# Patient Record
Sex: Male | Born: 1990 | Race: White | Hispanic: No | Marital: Married | Smoking: Former smoker
Health system: Southern US, Community
[De-identification: ages and names within clinical notes are randomized; demographics above are authoritative.]

## PROBLEM LIST (undated history)

## (undated) DIAGNOSIS — G709 Myoneural disorder, unspecified: Secondary | ICD-10-CM

## (undated) DIAGNOSIS — M1990 Unspecified osteoarthritis, unspecified site: Secondary | ICD-10-CM

## (undated) DIAGNOSIS — G35 Multiple sclerosis: Secondary | ICD-10-CM

## (undated) DIAGNOSIS — G35D Multiple sclerosis, unspecified: Secondary | ICD-10-CM

## (undated) DIAGNOSIS — N200 Calculus of kidney: Secondary | ICD-10-CM

## (undated) DIAGNOSIS — F419 Anxiety disorder, unspecified: Secondary | ICD-10-CM

## (undated) HISTORY — DX: Unspecified osteoarthritis, unspecified site: M19.90

## (undated) HISTORY — PX: FRACTURE SURGERY: SHX138

## (undated) HISTORY — DX: Myoneural disorder, unspecified: G70.9

## (undated) HISTORY — DX: Anxiety disorder, unspecified: F41.9

---

## 2015-03-01 DIAGNOSIS — J069 Acute upper respiratory infection, unspecified: Secondary | ICD-10-CM | POA: Insufficient documentation

## 2015-03-01 DIAGNOSIS — J029 Acute pharyngitis, unspecified: Secondary | ICD-10-CM | POA: Insufficient documentation

## 2015-03-01 DIAGNOSIS — S61402A Unspecified open wound of left hand, initial encounter: Secondary | ICD-10-CM | POA: Insufficient documentation

## 2015-03-01 DIAGNOSIS — Z23 Encounter for immunization: Secondary | ICD-10-CM | POA: Insufficient documentation

## 2015-12-22 DIAGNOSIS — F902 Attention-deficit hyperactivity disorder, combined type: Secondary | ICD-10-CM | POA: Insufficient documentation

## 2015-12-22 DIAGNOSIS — F1290 Cannabis use, unspecified, uncomplicated: Secondary | ICD-10-CM | POA: Insufficient documentation

## 2015-12-22 DIAGNOSIS — F909 Attention-deficit hyperactivity disorder, unspecified type: Secondary | ICD-10-CM | POA: Insufficient documentation

## 2017-08-17 DIAGNOSIS — B009 Herpesviral infection, unspecified: Secondary | ICD-10-CM | POA: Insufficient documentation

## 2018-05-31 DIAGNOSIS — F419 Anxiety disorder, unspecified: Secondary | ICD-10-CM | POA: Insufficient documentation

## 2018-12-29 DIAGNOSIS — G35 Multiple sclerosis: Secondary | ICD-10-CM

## 2019-01-01 DIAGNOSIS — R252 Cramp and spasm: Secondary | ICD-10-CM | POA: Insufficient documentation

## 2019-10-08 DIAGNOSIS — Z8619 Personal history of other infectious and parasitic diseases: Secondary | ICD-10-CM | POA: Insufficient documentation

## 2019-11-08 DIAGNOSIS — R32 Unspecified urinary incontinence: Secondary | ICD-10-CM | POA: Insufficient documentation

## 2020-05-27 DIAGNOSIS — R030 Elevated blood-pressure reading, without diagnosis of hypertension: Secondary | ICD-10-CM | POA: Insufficient documentation

## 2020-09-14 ENCOUNTER — Other Ambulatory Visit: Payer: Self-pay

## 2020-09-14 ENCOUNTER — Encounter: Payer: Self-pay | Admitting: Emergency Medicine

## 2020-09-14 ENCOUNTER — Ambulatory Visit
Admission: EM | Admit: 2020-09-14 | Discharge: 2020-09-14 | Disposition: A | Discharge status: Home / Self Care | Payer: Managed Care, Other (non HMO) | Attending: Family Medicine | Admitting: Family Medicine

## 2020-09-14 DIAGNOSIS — J069 Acute upper respiratory infection, unspecified: Secondary | ICD-10-CM

## 2020-09-14 HISTORY — DX: Multiple sclerosis, unspecified: G35.D

## 2020-09-14 HISTORY — DX: Multiple sclerosis: G35

## 2020-09-14 NOTE — Discharge Instructions (Addendum)
Rest, fluids.  Tylenol and Ibuprofen as needed. Warm salt water gargles for the sore throat.  Take care  Dr. Adriana Simas

## 2020-09-14 NOTE — ED Triage Notes (Signed)
Pt c/o nasal congestion left ear pain and sore throat. Started this morning. Denies fever. Declines covid testing.

## 2020-09-14 NOTE — ED Provider Notes (Signed)
[facility]    CSN: [id number] Arrival date & time: 09/14/20  0840      History   Chief Complaint Chief Complaint  Patient presents with  . Otalgia    left  . Nasal Congestion   HPI  30 year old male presents to the above complaints.  Patient reports that his son is sick.  He states that he developed symptoms this morning.  He reports sore throat and left ear pain.  Some congestion.  No fever.  No relieving factors.  Declines Covid testing.  No known exacerbating factors.  No other associated symptoms.  No other complaints.  Past Medical History:  Diagnosis Date  . MS (multiple sclerosis) (HCC)    Past Surgical History:  Procedure Laterality Date  . FRACTURE SURGERY Left    Home Medications    Prior to Admission medications   Medication Sig Start Date End Date Taking? Authorizing Provider  amphetamine-dextroamphetamine (ADDERALL) 5 MG tablet Take by mouth. 08/30/20  Yes [provider]  diazepam (VALIUM) 5 MG tablet Take by mouth. 09/03/20 10/03/20 Yes [provider]  cetirizine (ZYRTEC) 10 MG tablet Take by mouth.    [provider]  dronabinol (MARINOL) 5 MG capsule Take 5 mg by mouth 3 (three) times daily. 09/08/20   [provider]  gabapentin (NEURONTIN) 600 MG tablet Take 600 mg by mouth 3 (three) times daily. 08/20/20   [provider]  glatiramer (COPAXONE) 20 MG/ML SOSY injection Inject into the skin.    [provider]  tiZANidine (ZANAFLEX) 4 MG capsule Take 4 mg by mouth 3 (three) times daily. 07/12/20   [provider]    Social History Social History   Tobacco Use  . Smoking status: Never Smoker  . Smokeless tobacco: Never Used  Vaping Use  . Vaping Use: Never used  Substance Use Topics  . Alcohol use: Not Currently  . Drug use: Not Currently     Allergies   Augmentin [amoxicillin-pot clavulanate]   Review of Systems Review of Systems  Constitutional: Negative for fever.   HENT: Positive for congestion, ear pain and sore throat.    Physical Exam Triage Vital Signs ED Triage Vitals  Enc Vitals Group     BP 09/14/20 0903 138/80     Pulse Rate 09/14/20 0903 68     Resp 09/14/20 0903 20     Temp 09/14/20 0903 98.1 F (36.7 C)     Temp Source 09/14/20 0903 Oral     SpO2 09/14/20 0903 100 %     Weight 09/14/20 0859 189 lb (85.7 kg)     Height 09/14/20 0859 5\' 9"  (1.753 m)     Head Circumference --      Peak Flow --      Pain Score 09/14/20 0858 1     Pain Loc --      Pain Edu? --      Excl. in GC? --    Updated Vital Signs BP 138/80 (BP Location: Left Arm)   Pulse 68   Temp 98.1 F (36.7 C) (Oral)   Resp 20   Ht 5\' 9"  (1.753 m)   Wt 85.7 kg   SpO2 100%   BMI 27.91 kg/m   Visual Acuity Right Eye Distance:   Left Eye Distance:   Bilateral Distance:    Right Eye Near:   Left Eye Near:    Bilateral Near:     Physical Exam Constitutional:      General: He  is not in acute distress.    Appearance: Normal appearance. He is not ill-appearing.  HENT:     Head: Normocephalic and atraumatic.     Right Ear: Tympanic membrane normal.     Left Ear: Tympanic membrane normal.     Mouth/Throat:     Pharynx: Oropharynx is clear.  Eyes:     General:        Right eye: No discharge.        Left eye: No discharge.     Conjunctiva/sclera: Conjunctivae normal.  Cardiovascular:     Rate and Rhythm: Normal rate and regular rhythm.     Heart sounds: No murmur heard.   Pulmonary:     Effort: Pulmonary effort is normal.     Breath sounds: Normal breath sounds. No wheezing, rhonchi or rales.  Neurological:     Mental Status: He is alert.    UC Treatments / Results  Labs (all labs ordered are listed, but only abnormal results are displayed) Labs Reviewed - No data to display  EKG   Radiology No results found.  Procedures Procedures (including critical care time)  Medications Ordered in UC Medications - No data to display  Initial  Impression / Assessment and Plan / UC Course  I have reviewed the triage vital signs and the nursing notes.  Pertinent labs & imaging results that were available during my care of the patient were reviewed by me and considered in my medical decision making (see chart for details).    30 year old male presents with viral URI.  Advised supportive care and Tylenol and ibuprofen as needed.  Warm salt water gargles.  Final Clinical Impressions(s) / UC Diagnoses   Final diagnoses:  Viral upper respiratory tract infection     Discharge Instructions     Rest, fluids.  Tylenol and Ibuprofen as needed. Warm salt water gargles for the sore throat.  Take care  Dr. Adriana Simas    ED Prescriptions    None     PDMP not reviewed this encounter.   Tommie Sams, DO 09/14/20 1020

## 2020-10-13 ENCOUNTER — Ambulatory Visit: Payer: Managed Care, Other (non HMO) | Admitting: Pain Medicine

## 2020-11-05 ENCOUNTER — Emergency Department: Payer: Managed Care, Other (non HMO)

## 2020-11-05 ENCOUNTER — Other Ambulatory Visit: Payer: Self-pay

## 2020-11-05 ENCOUNTER — Emergency Department
Admission: EM | Admit: 2020-11-05 | Discharge: 2020-11-05 | Disposition: A | Discharge status: Home / Self Care | Payer: Managed Care, Other (non HMO) | Attending: Emergency Medicine | Admitting: Emergency Medicine

## 2020-11-05 DIAGNOSIS — U071 COVID-19: Secondary | ICD-10-CM | POA: Insufficient documentation

## 2020-11-05 DIAGNOSIS — M549 Dorsalgia, unspecified: Secondary | ICD-10-CM | POA: Insufficient documentation

## 2020-11-05 DIAGNOSIS — R509 Fever, unspecified: Secondary | ICD-10-CM | POA: Diagnosis present

## 2020-11-05 LAB — CBC WITH DIFFERENTIAL/PLATELET
Abs Immature Granulocytes: 0.01 10*3/uL (ref 0.00–0.07)
Basophils Absolute: 0 10*3/uL (ref 0.0–0.1)
Basophils Relative: 1 %
Eosinophils Absolute: 0.1 10*3/uL (ref 0.0–0.5)
Eosinophils Relative: 1 %
HCT: 36.5 % — ABNORMAL LOW (ref 39.0–52.0)
Hemoglobin: 12.8 g/dL — ABNORMAL LOW (ref 13.0–17.0)
Immature Granulocytes: 0 %
Lymphocytes Relative: 6 %
Lymphs Abs: 0.3 10*3/uL — ABNORMAL LOW (ref 0.7–4.0)
MCH: 29.3 pg (ref 26.0–34.0)
MCHC: 35.1 g/dL (ref 30.0–36.0)
MCV: 83.5 fL (ref 80.0–100.0)
Monocytes Absolute: 0.7 10*3/uL (ref 0.1–1.0)
Monocytes Relative: 13 %
Neutro Abs: 4.5 10*3/uL (ref 1.7–7.7)
Neutrophils Relative %: 79 %
Platelets: 152 10*3/uL (ref 150–400)
RBC: 4.37 MIL/uL (ref 4.22–5.81)
RDW: 11.7 % (ref 11.5–15.5)
WBC: 5.6 10*3/uL (ref 4.0–10.5)
nRBC: 0 % (ref 0.0–0.2)

## 2020-11-05 LAB — URINALYSIS, COMPLETE (UACMP) WITH MICROSCOPIC
Bacteria, UA: NONE SEEN
Bilirubin Urine: NEGATIVE
Glucose, UA: NEGATIVE mg/dL
Hgb urine dipstick: NEGATIVE
Ketones, ur: 5 mg/dL — AB
Leukocytes,Ua: NEGATIVE
Nitrite: NEGATIVE
Protein, ur: NEGATIVE mg/dL
Specific Gravity, Urine: 1.02 (ref 1.005–1.030)
pH: 6 (ref 5.0–8.0)

## 2020-11-05 LAB — COMPREHENSIVE METABOLIC PANEL
ALT: 14 U/L (ref 0–44)
AST: 17 U/L (ref 15–41)
Albumin: 4.6 g/dL (ref 3.5–5.0)
Alkaline Phosphatase: 49 U/L (ref 38–126)
Anion gap: 10 (ref 5–15)
BUN: 12 mg/dL (ref 6–20)
CO2: 24 mmol/L (ref 22–32)
Calcium: 9 mg/dL (ref 8.9–10.3)
Chloride: 98 mmol/L (ref 98–111)
Creatinine, Ser: 1.04 mg/dL (ref 0.61–1.24)
GFR, Estimated: >60 mL/min (ref >60)
Glucose, Bld: 104 mg/dL — ABNORMAL HIGH (ref 70–99)
Potassium: 3.8 mmol/L (ref 3.5–5.1)
Sodium: 132 mmol/L — ABNORMAL LOW (ref 135–145)
Total Bilirubin: 0.8 mg/dL (ref 0.3–1.2)
Total Protein: 7 g/dL (ref 6.5–8.1)

## 2020-11-05 LAB — LACTIC ACID, PLASMA
Lactic Acid, Venous: 1.4 mmol/L (ref 0.5–1.9)
Lactic Acid, Venous: 0.7 mmol/L (ref 0.5–1.9)

## 2020-11-05 LAB — RESP PANEL BY RT-PCR (FLU A&B, COVID) ARPGX2
Influenza A by PCR: NEGATIVE
Influenza B by PCR: NEGATIVE
SARS Coronavirus 2 by RT PCR: POSITIVE — AB

## 2020-11-05 IMAGING — CR DG CHEST 2V
2 series · 2 of 2 positions shown · non-contrast
Comparison: None.

CLINICAL DATA: Back pain and fever x2 days

EXAM:
CHEST - 2 VIEW

[chest pa]
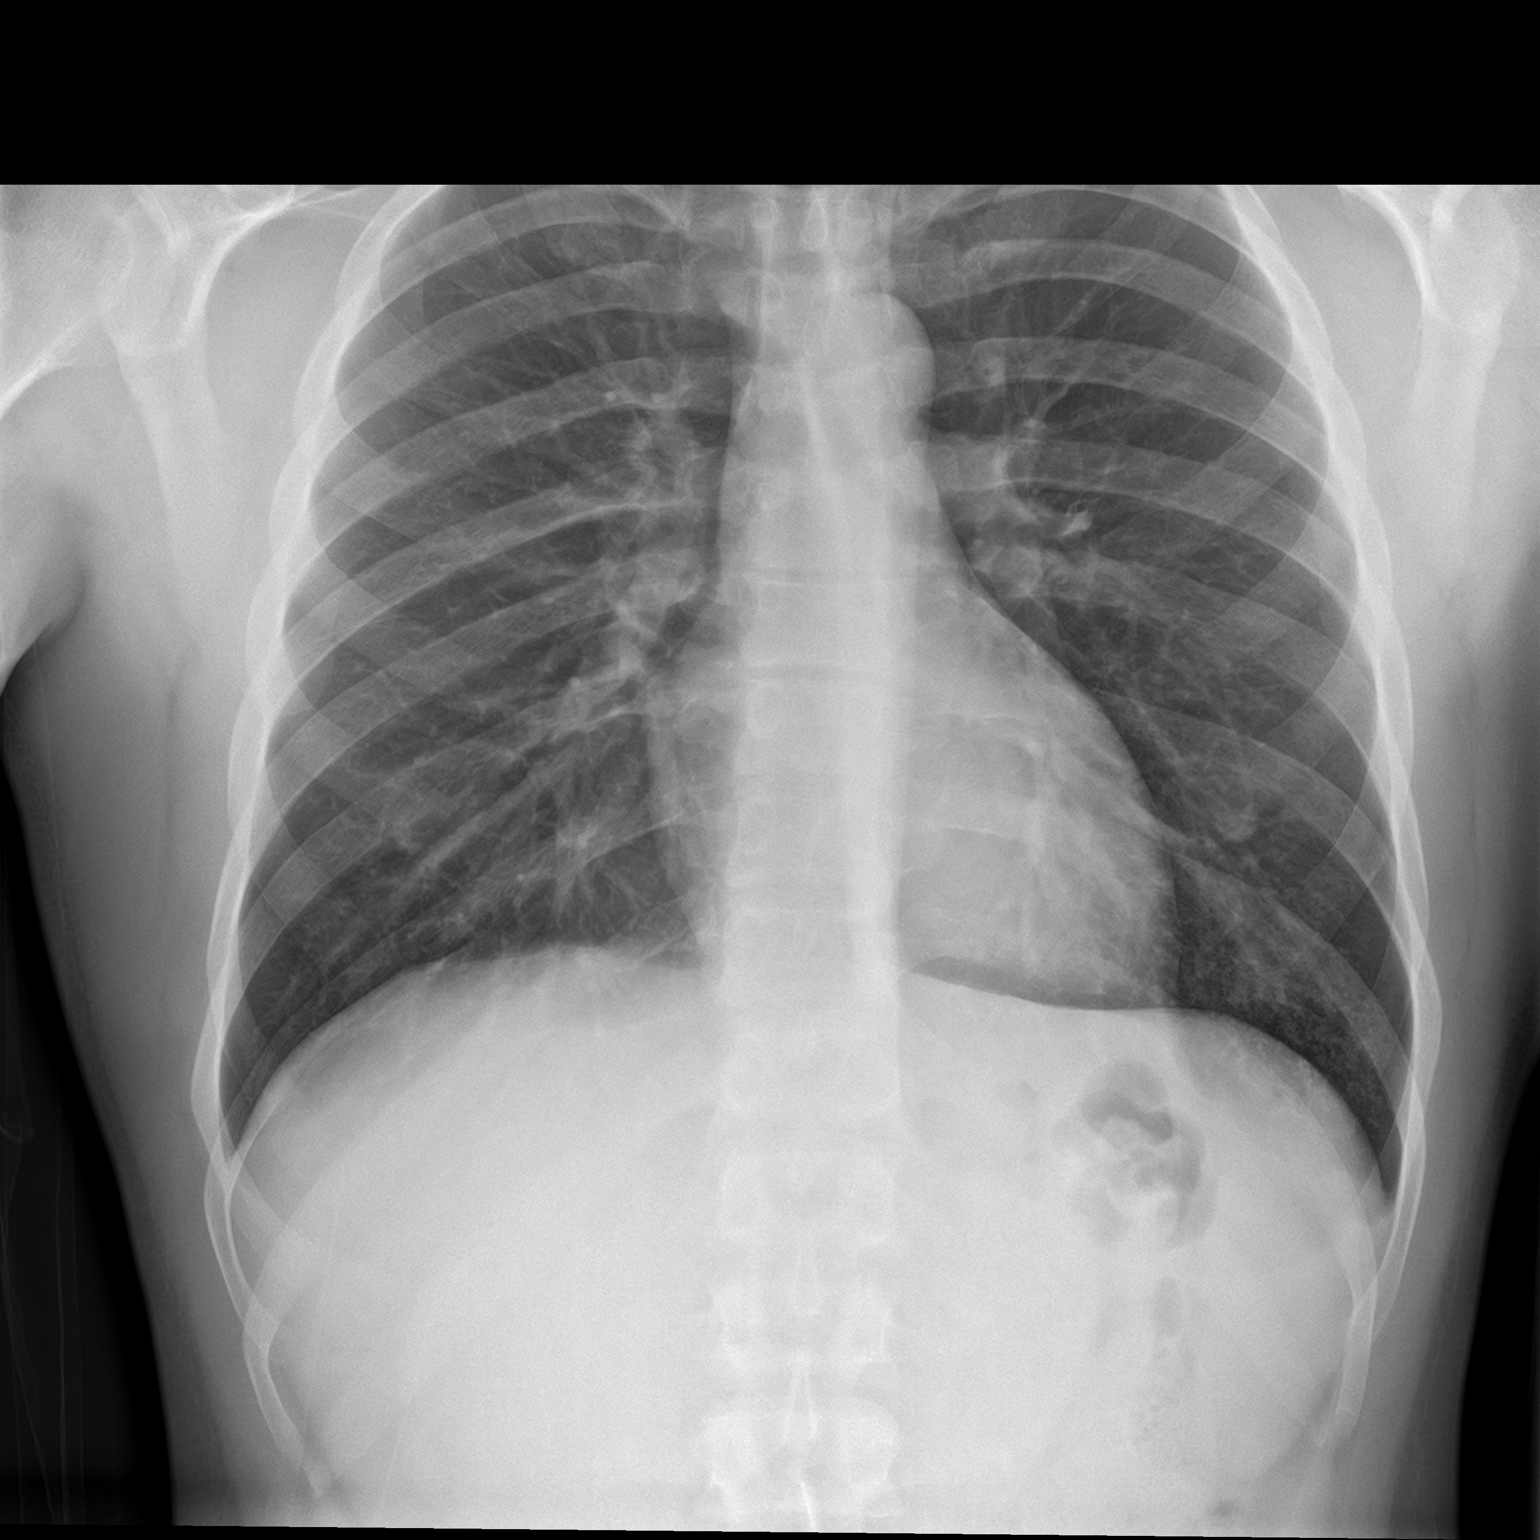

[chest lat]
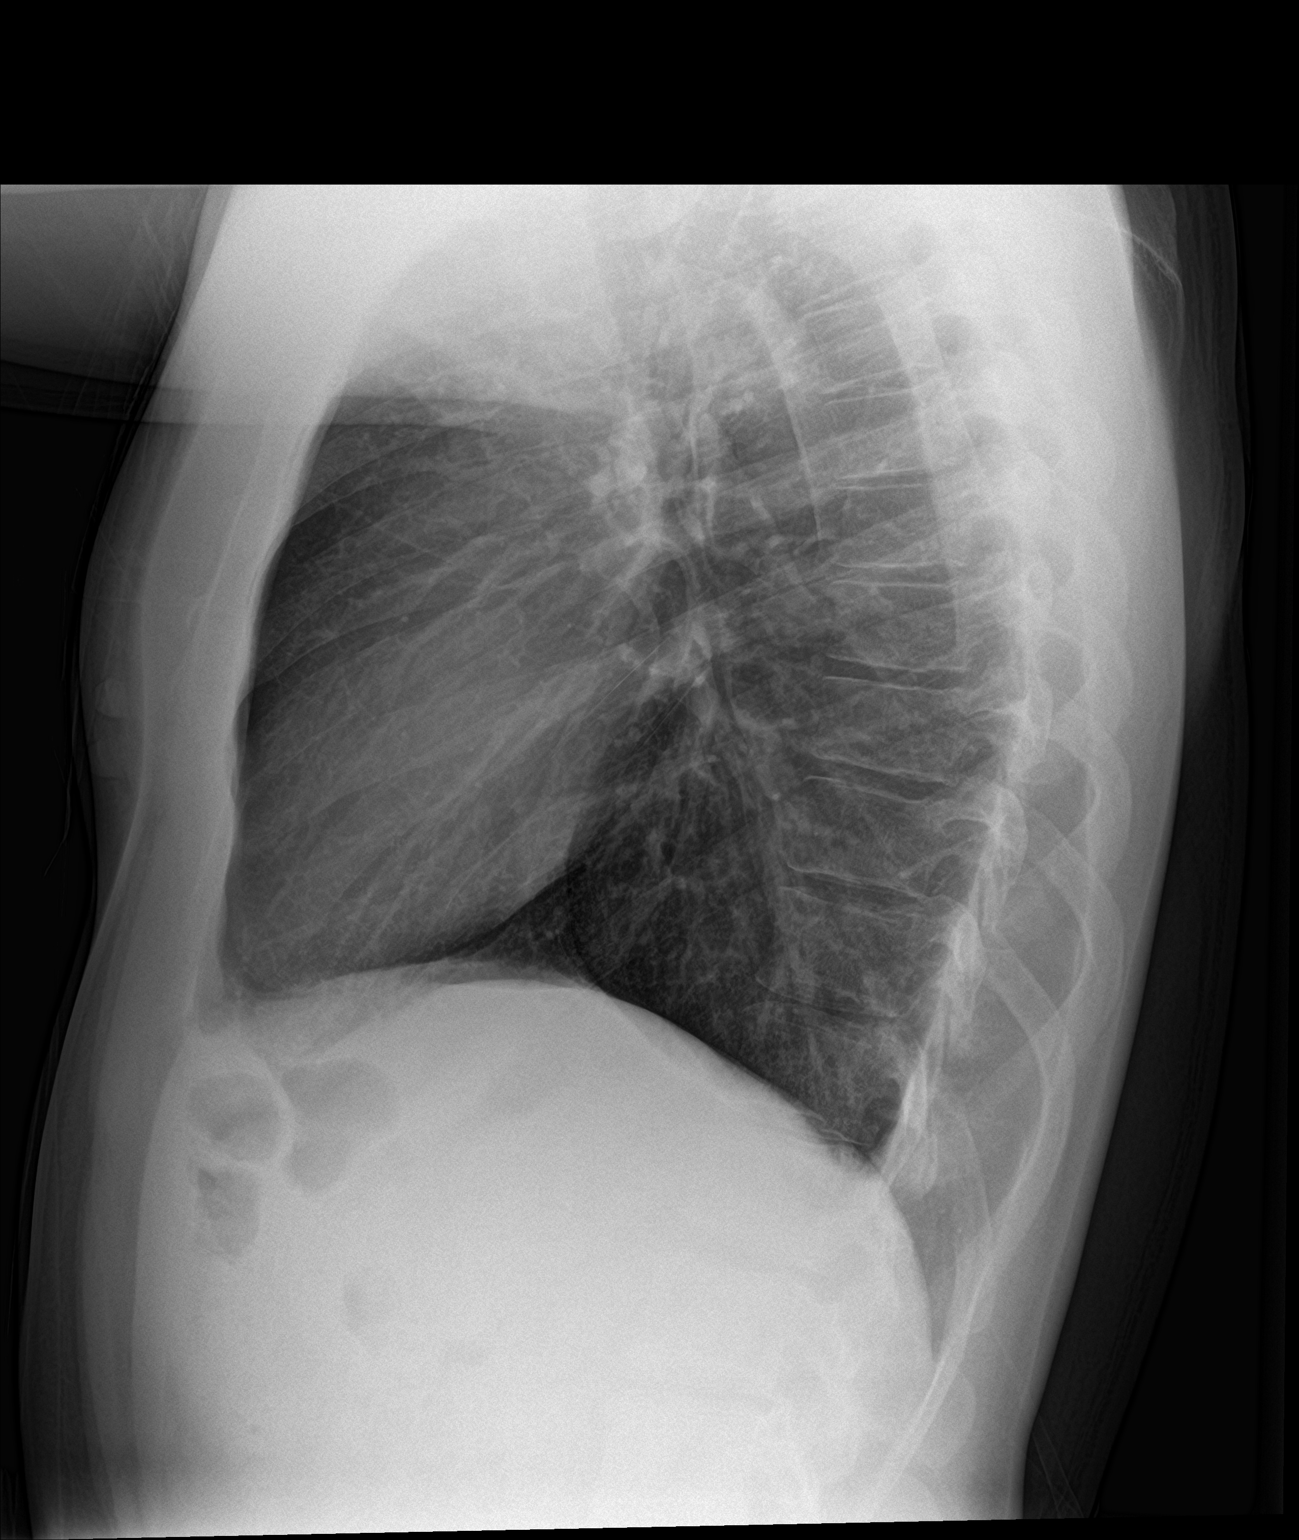

[2 of 2 positions shown; findings below may reference images not displayed]

FINDINGS: The heart size and mediastinal contours are within normal limits. No
focal consolidation. No pleural effusion. No pneumothorax. The
visualized skeletal structures are unremarkable.
IMPRESSION: No active cardiopulmonary disease.

## 2020-11-05 MED ORDER — LACTATED RINGERS IV BOLUS
500.0000 mL | Freq: Once | INTRAVENOUS | Status: DC
Start: 2020-11-05 — End: 2020-11-05

## 2020-11-05 MED ORDER — ACETAMINOPHEN 500 MG PO TABS
1000.0000 mg | ORAL_TABLET | Freq: Once | ORAL | Status: AC
  Administered 2020-11-05: 1000 mg via ORAL
  Filled 2020-11-05: qty 2

## 2020-11-05 MED ORDER — SODIUM CHLORIDE 0.9 % IV BOLUS
1000.0000 mL | Freq: Once | INTRAVENOUS | Status: AC
  Administered 2020-11-05: 1000 mL via INTRAVENOUS

## 2020-11-05 NOTE — ED Provider Notes (Signed)
[facility] Emergency Department Provider Note  ____________________________________________   Event Date/Time   First MD Initiated Contact with Patient 11/05/20 1547     (approximate)  I have reviewed the triage vital signs and the nursing notes.   HISTORY  Chief Complaint infection   HPI [name] is a 30 y.o. male presents to the ED with complaint of back pain, fever for the last 2 days.  Patient has been taking over-the-counter medication with the last dose of Tylenol being approximately 1 hour prior to arrival.  Patient denies any nausea, vomiting or diarrhea.  He denies any coughing, URI symptoms or urinary symptoms..  Patient has a history of multiple sclerosis and is followed by neurology at [facility].  He has been fully vaccinated with the last vaccine being given proximately June 2021.  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  Currently he rates his pain as 6 out of 10.     Past Medical History:  Diagnosis Date  . MS (multiple sclerosis) (HCC)     There are no problems to display for this patient.   Past Surgical History:  Procedure Laterality Date  . FRACTURE SURGERY Left     Prior to Admission medications   Medication Sig Start Date End Date Taking? Authorizing Provider  amphetamine-dextroamphetamine (ADDERALL) 5 MG tablet Take by mouth. 08/30/20   [provider]  cetirizine (ZYRTEC) 10 MG tablet Take by mouth.    [provider]  dronabinol (MARINOL) 5 MG capsule Take 5 mg by mouth 3 (three) times daily. 09/08/20   [provider]  gabapentin (NEURONTIN) 600 MG tablet Take 600 mg by mouth 3 (three) times daily. 08/20/20   [provider]  glatiramer (COPAXONE) 20 MG/ML SOSY injection Inject into the skin.    [provider]  tiZANidine (ZANAFLEX) 4 MG capsule Take 4 mg by mouth 3 (three) times daily. 07/12/20   [provider]    Allergies Augmentin [amoxicillin-pot clavulanate]  No family history on file.  Social History Social History   Tobacco Use  . Smoking status: Never Smoker  . Smokeless tobacco: Never Used  Vaping Use  . Vaping Use: Never used  Substance Use Topics  . Alcohol use: Not Currently  . Drug use: Not Currently    Review of Systems Constitutional: Positive fever/chills Eyes: No visual changes. ENT: No sore throat. Cardiovascular: Denies chest pain. Respiratory: Denies shortness of breath.  Negative for cough. Gastrointestinal: No abdominal pain.  No nausea, no vomiting.  No diarrhea.  No constipation. Genitourinary: Negative for dysuria. Musculoskeletal: Positive for back pain. Skin: Negative for rash. Neurological: Negative for headaches, focal weakness or numbness.  ____________________________________________   PHYSICAL EXAM:  VITAL SIGNS: ED Triage Vitals  Enc Vitals Group     BP 11/05/20 1448 102/65     Pulse Rate 11/05/20 1448 100     Resp 11/05/20 1448 20     Temp 11/05/20 1448 (!) 101.3 F (38.5 C)     Temp Source 11/05/20 1448 Oral     SpO2 11/05/20 1448 95 %     Weight 11/05/20 1449 180 lb (81.6 kg)     Height 11/05/20 1449 5\' 9"  (1.753 m)     Head Circumference --      Peak Flow --      Pain Score 11/05/20 1449 6     Pain Loc --  Pain Edu? --      Excl. in GC? --    Constitutional: Alert and oriented. Well appearing and in no acute distress.  Patient is able to talk in complete sentences without any difficulty. Eyes: Conjunctivae are normal.  Head: Atraumatic. Nose: No congestion/rhinnorhea. Neck: No stridor.   Cardiovascular: Normal rate, regular rhythm. Grossly normal heart sounds.  Good peripheral circulation. Respiratory: Normal respiratory effort.  No retractions. Lungs CTAB. Gastrointestinal: Soft and nontender. No distention. No CVA tenderness. Musculoskeletal: Moves upper and lower extremities without any difficulty.   No edema noted lower extremities. Neurologic:  Normal speech and language. No gross focal neurologic deficits are appreciated.  Skin:  Skin is warm, dry and intact.  Psychiatric: Mood and affect are normal. Speech and behavior are normal.  ____________________________________________   LABS (all labs ordered are listed, but only abnormal results are displayed)  Labs Reviewed  RESP PANEL BY RT-PCR (FLU A&B, COVID) ARPGX2 - Abnormal; Notable for the following components:      Result Value   SARS Coronavirus 2 by RT PCR POSITIVE (*)    All other components within normal limits  COMPREHENSIVE METABOLIC PANEL - Abnormal; Notable for the following components:   Sodium 132 (*)    Glucose, Bld 104 (*)    All other components within normal limits  CBC WITH DIFFERENTIAL/PLATELET - Abnormal; Notable for the following components:   Hemoglobin 12.8 (*)    HCT 36.5 (*)    Lymphs Abs 0.3 (*)    All other components within normal limits  URINALYSIS, COMPLETE (UACMP) WITH MICROSCOPIC - Abnormal; Notable for the following components:   Color, Urine YELLOW (*)    APPearance HAZY (*)    Ketones, ur 5 (*)    All other components within normal limits  LACTIC ACID, PLASMA  LACTIC ACID, PLASMA   ____________________________________________ ____________________________________________  RADIOLOGY Beaulah Corin, personally viewed and evaluated these images (plain radiographs) as part of my medical decision making, as well as reviewing the written report by the radiologist.   Official radiology report(s): DG Chest 2 View  Result Date: 11/05/2020 CLINICAL DATA:  Back pain and fever x2 days EXAM: CHEST - 2 VIEW COMPARISON:  None. FINDINGS: The heart size and mediastinal contours are within normal limits. No focal consolidation. No pleural effusion. No pneumothorax. The visualized skeletal structures are unremarkable. IMPRESSION: No active cardiopulmonary disease. Electronically Signed   By: Maudry Mayhew MD   On: 11/05/2020 15:34    ____________________________________________   PROCEDURES  Procedure(s) performed (including Critical Care):  Procedures   ____________________________________________   INITIAL IMPRESSION / ASSESSMENT AND PLAN / ED COURSE  As part of my medical decision making, I reviewed the following data within the electronic MEDICAL RECORD NUMBER Notes from prior ED visits and [place] Controlled Substance Database  30 year old male presents to the ED with complaint of back pain and fever for the last 2 days.  He denies any upper respiratory symptoms, GI or GU symptoms.  Patient is fully vaccinated and is unaware of any exposure to COVID.  Lab work was reassuring and chest x-ray was negative.  COVID test was positive and patient was made aware.  A consult for evaluation for IV therapy was placed and patient was made aware that most likely he will get a phone call tomorrow.  He will continue drinking fluids and controlling his fever with over-the-counter medication.  Patient had no worsening of his symptoms and temperature at the time of his discharge was  100.4.  Patient was given strict return precautions that should he develop any shortness of breath or difficulty breathing he is to return to the emergency department immediately. ____________________________________________   FINAL CLINICAL IMPRESSION(S) / ED DIAGNOSES  Final diagnoses:  COVID-19     ED Discharge Orders    None      *Please note:  Damon Blair was evaluated in Emergency Department on 11/05/2020 for the symptoms described in the history of present illness. He was evaluated in the context of the global COVID-19 pandemic, which necessitated consideration that the patient might be at risk for infection with the SARS-CoV-2 virus that causes COVID-19. Institutional protocols and algorithms that pertain to the evaluation of patients at risk for COVID-19 are in a state of rapid change based on  information released by regulatory bodies including the CDC and federal and state organizations. These policies and algorithms were followed during the patient's care in the ED.  Some ED evaluations and interventions may be delayed as a result of limited staffing during and the pandemic.*   Note:  This document was prepared using Dragon voice recognition software and may include unintentional dictation errors.    Tommi Rumps, PA-C 11/05/20 Bernell List, MD 11/07/20 1859

## 2020-11-05 NOTE — ED Notes (Signed)
Confirmed elevated temp of 100.4 with Dr. Penne Lash, given OK to discharge pt home.

## 2020-11-05 NOTE — ED Triage Notes (Signed)
Pt to ED POV for back pain, fever x 2 days. Sent from PCP to assess for infection. Pt has MS.  Febrile on arrival, took tylenol PTA

## 2020-11-05 NOTE — Discharge Instructions (Signed)
Continue take ibuprofen/Tylenol as needed for fever.  Increase fluids.  You will also need to quarantine with your family as you are positive for COVID.  The transfusion team should call and talk to you about transfusions since you do meet the criteria.  If any severe worsening of your symptoms such as shortness of breath or difficulty breathing return to the emergency department immediately.

## 2020-11-06 ENCOUNTER — Telehealth: Payer: Self-pay | Admitting: Unknown Physician Specialty

## 2020-11-06 NOTE — Telephone Encounter (Signed)
I connected by phone with Damon Blair on 11/06/2020 at 4:22 PM to discuss the potential use of a new treatment for mild to moderate COVID-19 viral infection in non-hospitalized patients.  This patient is a 30 y.o. male that meets the FDA criteria for Emergency Use Authorization of COVID monoclonal antibody bebtelovimab.  Has a (+) direct SARS-CoV-2 viral test result  Has mild or moderate COVID-19   Is NOT hospitalized due to COVID-19  Is within 10 days of symptom onset  Has at least one of the high risk factor(s) for progression to severe COVID-19 and/or hospitalization as defined in EUA.  Specific high risk criteria : Immunosuppressive Disease or Treatment   I have spoken and communicated the following to the patient or parent/caregiver regarding COVID monoclonal antibody treatment:  1. FDA has authorized the emergency use for the treatment of mild to moderate COVID-19 in adults and pediatric patients with positive results of direct SARS-CoV-2 viral testing who are 31 years of age and older weighing at least 40 kg, and who are at high risk for progressing to severe COVID-19 and/or hospitalization.  2. The significant known and potential risks and benefits of COVID monoclonal antibody, and the extent to which such potential risks and benefits are unknown.  3. Information on available alternative treatments and the risks and benefits of those alternatives, including clinical trials.  4. Patients treated with COVID monoclonal antibody should continue to self-isolate and use infection control measures (e.g., wear mask, isolate, social distance, avoid sharing personal items, clean and disinfect "high touch" surfaces, and frequent handwashing) according to CDC guidelines.   5. The patient or parent/caregiver has the option to accept or refuse COVID monoclonal antibody treatment.  6. Discussion about the monoclonal antibody infusion does not ensure treatment. The patient will be placed on  a list and scheduled according to risk, symptom onset and availability. A scheduler will reach to the patient to let them know if we can accommodate their infusion or not.  After reviewing this information with the patient, the patient has agreed to receive one of the available covid 19 monoclonal antibodies and will be provided an appropriate fact sheet prior to infusion. Gabriel Cirri, NP 11/06/2020 4:22 PM Sx onset 4/21  Orders not yet written

## 2020-11-08 ENCOUNTER — Other Ambulatory Visit: Payer: Self-pay

## 2020-11-08 ENCOUNTER — Telehealth: Payer: Self-pay

## 2020-11-08 ENCOUNTER — Other Ambulatory Visit: Payer: Self-pay | Admitting: Adult Health

## 2020-11-08 ENCOUNTER — Ambulatory Visit: Payer: Managed Care, Other (non HMO)

## 2020-11-08 DIAGNOSIS — U071 COVID-19: Secondary | ICD-10-CM

## 2020-11-08 NOTE — Progress Notes (Signed)
Patient screened by Gabriel Cirri.  Orders placed accordingly.  Lillard Anes, NP

## 2020-12-10 DIAGNOSIS — F1110 Opioid abuse, uncomplicated: Secondary | ICD-10-CM | POA: Insufficient documentation

## 2020-12-10 DIAGNOSIS — F1111 Opioid abuse, in remission: Secondary | ICD-10-CM | POA: Insufficient documentation

## 2021-03-08 ENCOUNTER — Other Ambulatory Visit: Payer: Self-pay | Admitting: Neurology

## 2021-03-08 DIAGNOSIS — G35 Multiple sclerosis: Secondary | ICD-10-CM

## 2021-03-24 ENCOUNTER — Other Ambulatory Visit: Payer: Self-pay | Admitting: Neurology

## 2021-03-24 DIAGNOSIS — G35 Multiple sclerosis: Secondary | ICD-10-CM

## 2021-03-29 ENCOUNTER — Other Ambulatory Visit: Payer: Self-pay

## 2021-03-29 ENCOUNTER — Ambulatory Visit
Admission: RE | Admit: 2021-03-29 | Discharge: 2021-03-29 | Disposition: A | Discharge status: Home / Self Care | Payer: Managed Care, Other (non HMO) | Source: Ambulatory Visit | Attending: Neurology | Admitting: Neurology

## 2021-03-29 DIAGNOSIS — G35 Multiple sclerosis: Secondary | ICD-10-CM

## 2021-03-29 IMAGING — MR MR HEAD WO/W CM
15 series · 48 of 48 positions shown · IV contrast (gadavist)
Comparison: None

CLINICAL DATA: Provided history: Multiple sclerosis. Additional
history provided by scanning technologist: Patient reports MS
follow-up, involuntary muscle movements, neck pain.

EXAM:
MRI HEAD WITHOUT AND WITH CONTRAST
TECHNIQUE: Multiplanar, multiecho pulse sequences of the brain and surrounding
structures were obtained without and with intravenous contrast.
CONTRAST:  8mL GADAVIST GADOBUTROL 1 MMOL/ML IV SOLN

[Series 5: T1 · sagittal · 4.0mm · 0.75mm/px · 2 of 31 slices shown (1 of 3)]
[im 1/31]
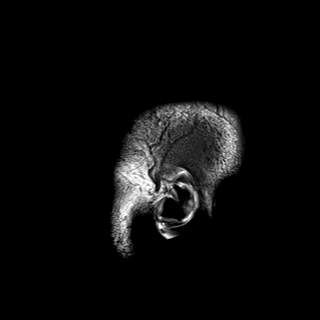
[im 31/31]
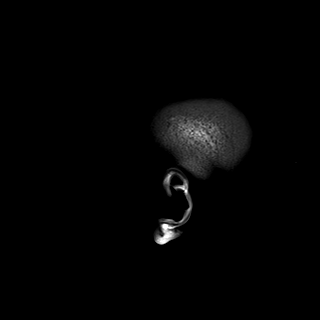

[Series 6: DWI · axial · 3.0mm · 0.94mm/px · z∈[-60,+80]mm · 7 of 160 slices shown (1 of 3)]
[im 1/160]
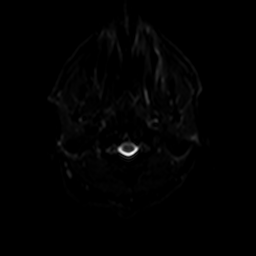
[im 27/160]
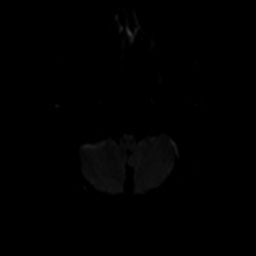
[im 54/160]
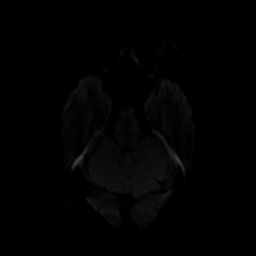
[im 80/160]
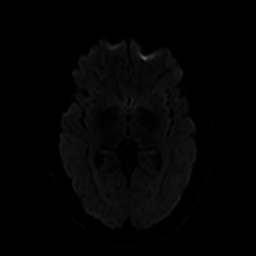
[im 107/160]
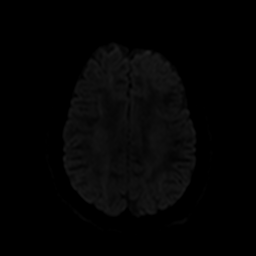
[im 133/160]
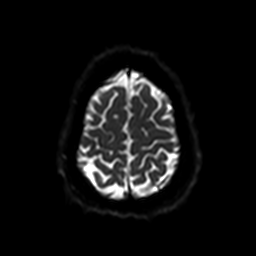
[im 160/160]
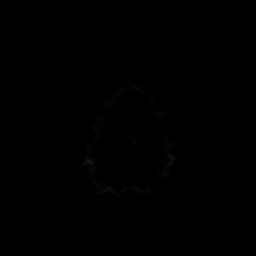

[Series 7: ax dwi_tracew · axial · 3.0mm · 0.94mm/px · z∈[-60,+80]mm · 4 of 80 slices shown]
[im 1/80]
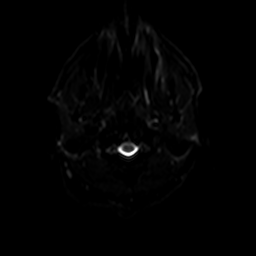
[im 27/80]
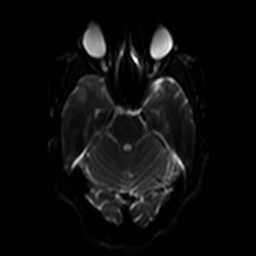
[im 53/80]
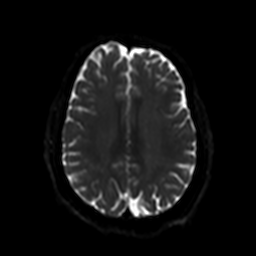
[im 80/80]
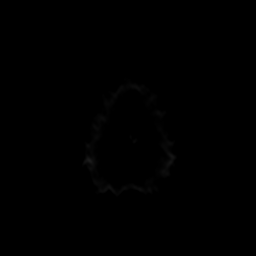

[Series 8: ax dwi_adc · axial · 3.0mm · 0.94mm/px · z∈[-60,+80]mm · 2 of 38 slices shown]
[im 1/38]
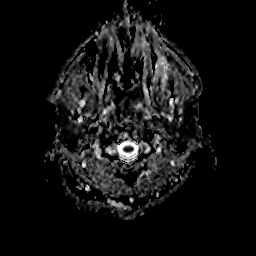
[im 38/38]
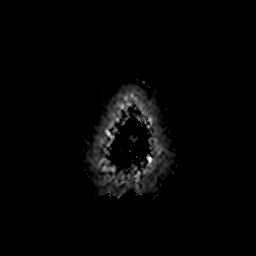

[Series 9: DWI · coronal · 5.0mm · 1.44mm/px · 3 of 62 slices shown (2 of 3)]
[im 1/62]
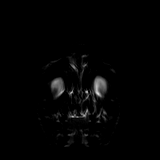
[im 31/62]
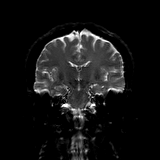
[im 62/62]
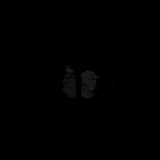

[Series 10: DWI · coronal · 5.0mm · 1.44mm/px · 1 of 31 slices shown (3 of 3)]
[im 1/31]
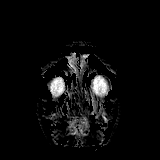

[Series 12: swi_images · axial · 1.5mm · 0.90mm/px · z∈[-70,+72]mm · 4 of 96 slices shown]
[im 1/96]
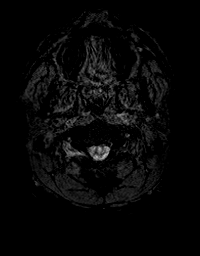
[im 32/96]
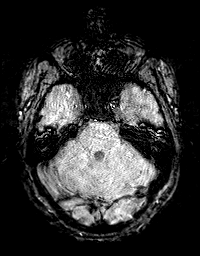
[im 64/96]
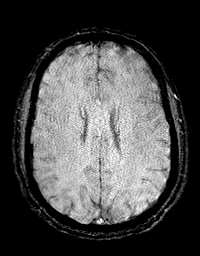
[im 96/96]
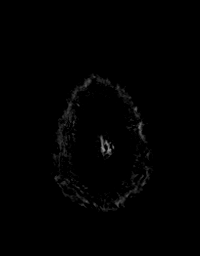

[Series 13: mip_images(sw) · axial · 12.0mm · 0.90mm/px · z∈[-65,+67]mm · 4 of 89 slices shown]
[im 1/89]
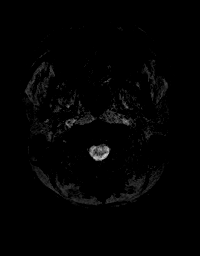
[im 30/89]
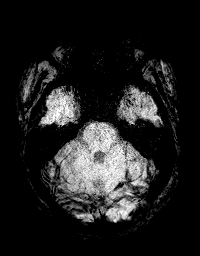
[im 59/89]
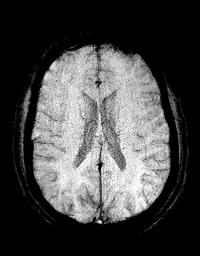
[im 89/89]
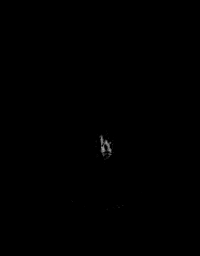

[Series 14: T2 · axial · 4.0mm · 0.36mm/px · 1 of 27 slices shown]
[im 1/27]
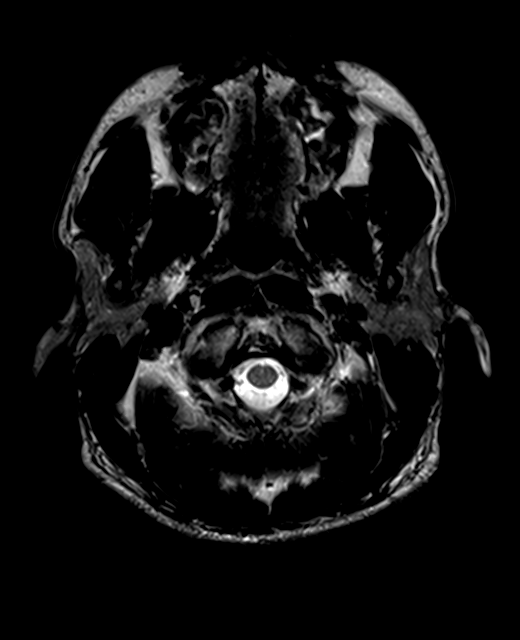

[Series 15: T1 · axial · 1.0mm · 0.94mm/px · z∈[-78,+81]mm · 7 of 160 slices shown (2 of 3)]
[im 1/160]
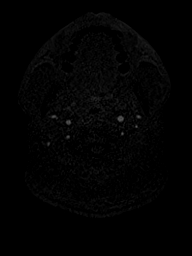
[im 27/160]
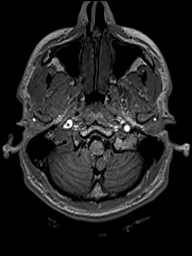
[im 54/160]
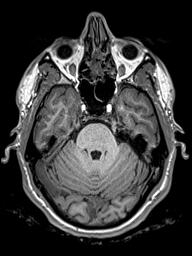
[im 80/160]
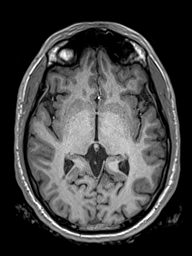
[im 107/160]
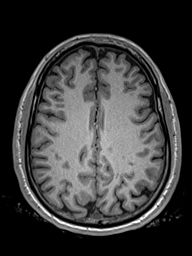
[im 133/160]
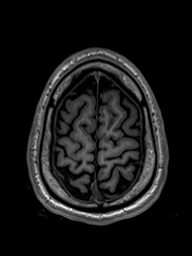
[im 160/160]
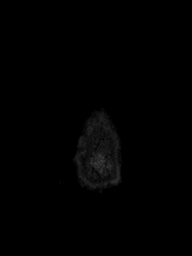

[Series 16: FLAIR · sagittal · 4.0mm · 0.72mm/px · 1 of 27 slices shown (1 of 2)]
[im 1/27]
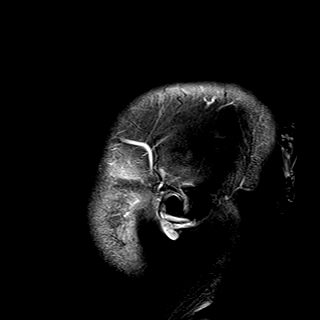

[Series 17: FLAIR · axial · 3.0mm · 0.72mm/px · 1 of 26 slices shown (2 of 2)]
[im 1/26]
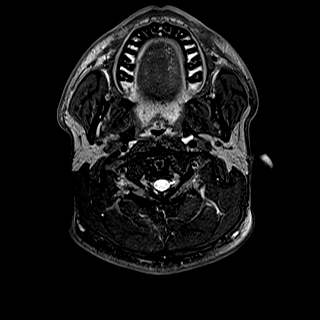

[Series 18: T2 post-contrast · coronal · 4.5mm · 0.36mm/px · 2 of 35 slices shown]
[im 1/35]
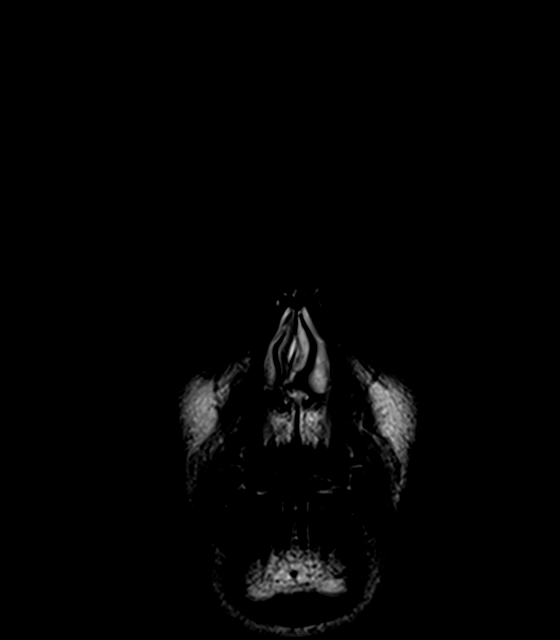
[im 35/35]
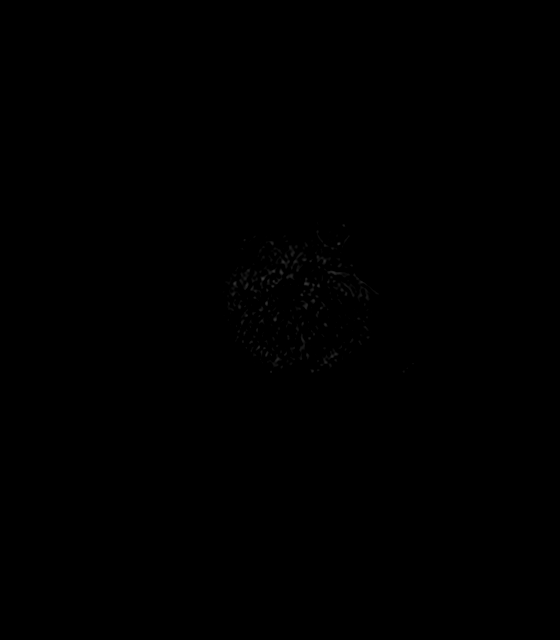

[Series 19: T1 · axial · 1.0mm · 0.94mm/px · z∈[-78,+81]mm · 7 of 160 slices shown (3 of 3)]
[im 1/160]
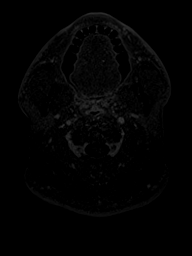
[im 27/160]
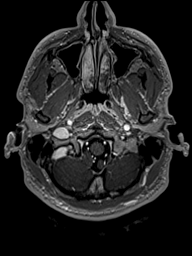
[im 54/160]
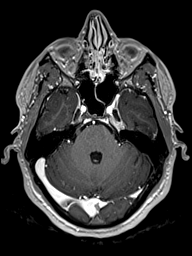
[im 80/160]
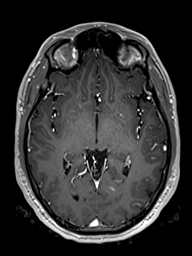
[im 107/160]
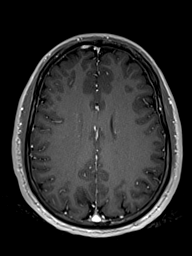
[im 133/160]
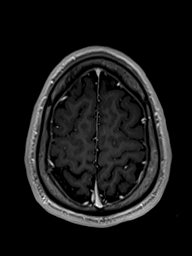
[im 160/160]
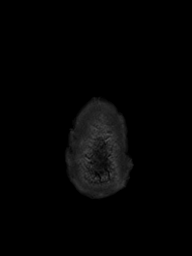

[Series 20: T1 post-contrast · coronal · 4.5mm · 0.72mm/px · 2 of 35 slices shown]
[im 1/35]
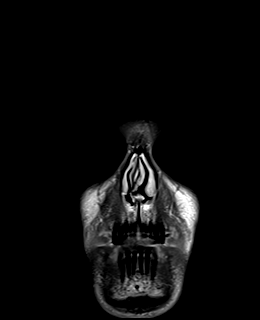
[im 35/35]
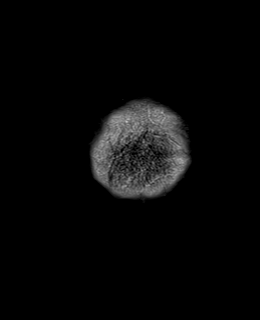

[48 of 48 positions shown; findings below may reference images not displayed]

FINDINGS: Brain:

Cerebral volume is normal.

Multifocal T2/FLAIR hyperintense signal abnormality within the
bilateral subcortical/juxtacortical and deep periventricular white
matter. The largest focus of signal abnormality is present within
the left frontal white matter, measuring 7 mm (series 16, image 18).
Overall, the signal changes are mild in extent. No posterior fossa
lesions are identified.

3 mm well-circumscribed T2 hyperintense focus within the left globus
pallidus. This is nonspecific, but may reflect a prominent
perivascular space, demyelinating lesion or chronic lacunar infarct
(series 14, image 15) (series 17, image 14).

There is no acute infarct.

No evidence of an intracranial mass.

No chronic intracranial blood products.

No extra-axial fluid collection.

No midline shift.

No pathologic intracranial enhancement is identified.

Vascular: Maintained flow voids within the proximal large arterial
vessels.

Skull and upper cervical spine: No focal suspicious marrow lesion.

Sinuses/Orbits: Visualized orbits show no acute finding. Trace
mucosal thickening within the left frontal sinus. Moderate mucosal
thickening within the bilateral ethmoid sinuses. Mild mucosal
thickening within the bilateral sphenoid and maxillary sinuses.
IMPRESSION: Multifocal T2 FLAIR hyperintense signal abnormality within the
cerebral white matter, as described. These signal changes are
nonspecific by imaging, but compatible with the provided history of
multiple sclerosis. No abnormal intracranial enhancement to suggest
active demyelination.

3 mm well-circumscribed T2 hyperintense focus within the left basal
ganglia. Considerations include a prominent perivascular space, a
demyelinating lesion or chronic lacunar infarct.

Paranasal sinus disease, as described.

## 2021-03-29 MED ORDER — GADOBUTROL 1 MMOL/ML IV SOLN
8.0000 mL | Freq: Once | INTRAVENOUS | Status: AC | PRN
  Administered 2021-03-29: 8 mL via INTRAVENOUS

## 2021-04-09 ENCOUNTER — Ambulatory Visit
Admission: RE | Admit: 2021-04-09 | Discharge: 2021-04-09 | Disposition: A | Discharge status: Home / Self Care | Payer: Managed Care, Other (non HMO) | Source: Ambulatory Visit | Attending: Neurology | Admitting: Neurology

## 2021-04-09 ENCOUNTER — Other Ambulatory Visit: Payer: Self-pay

## 2021-04-09 DIAGNOSIS — G35 Multiple sclerosis: Secondary | ICD-10-CM

## 2021-04-09 IMAGING — MR MR CERVICAL SPINE WO/W CM
5 of 9 series · 30 of 48 positions shown · IV contrast (multihance)
Comparison: Prior brain MRI from [DATE].

CLINICAL DATA: Initial evaluation for multiple sclerosis. Bilateral
arm weakness and tingling.

EXAM:
MRI CERVICAL SPINE WITHOUT AND WITH CONTRAST
TECHNIQUE: Multiplanar and multiecho pulse sequences of the cervical spine, to
include the craniocervical junction and cervicothoracic junction,
were obtained without and with intravenous contrast.
CONTRAST:  19mL MULTIHANCE GADOBENATE DIMEGLUMINE 529 MG/ML IV SOLN

[Series 5: T1 · sagittal · 3.0mm · 0.66mm/px · 3 of 15 slices shown (1 of 3)]
[im 1/15]
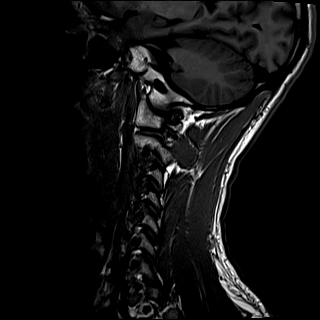
[im 8/15]
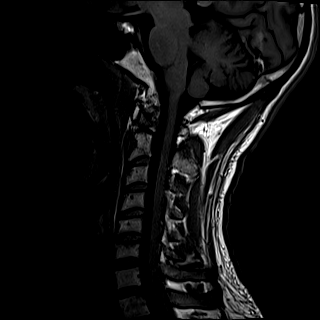
[im 15/15]
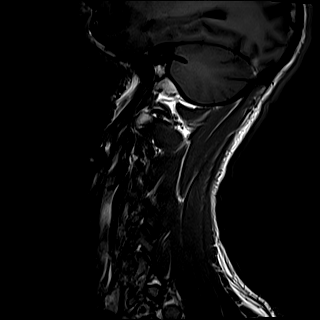

[Series 7: T2 · axial · 3.0mm · 0.50mm/px · z∈[-68,+32]mm · 7 of 32 slices shown (1 of 2)]
[im 1/32]
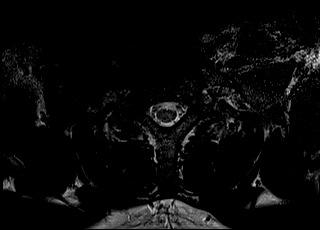
[im 6/32]
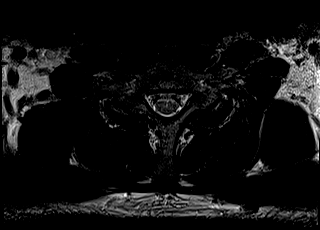
[im 11/32]
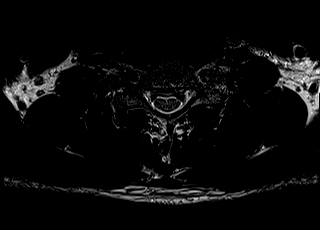
[im 16/32]
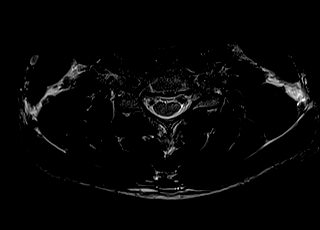
[im 21/32]
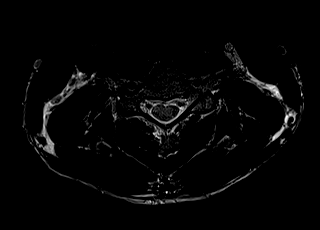
[im 26/32]
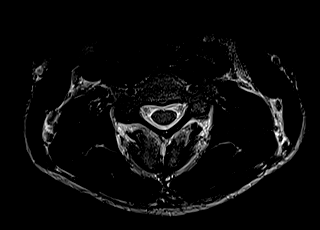
[im 32/32]
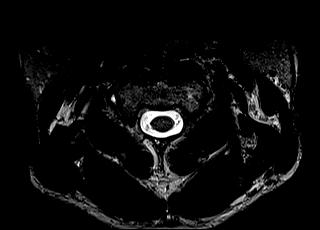

[Series 9: T1 · axial · non-contrast · 3.0mm · 0.31mm/px · z∈[-68,+32]mm · 8 of 32 slices shown (2 of 3)]
[im 1/32]
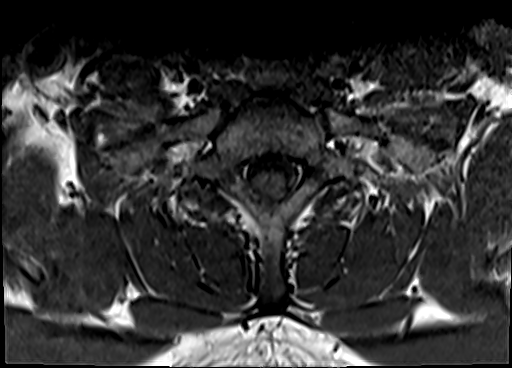
[im 5/32]
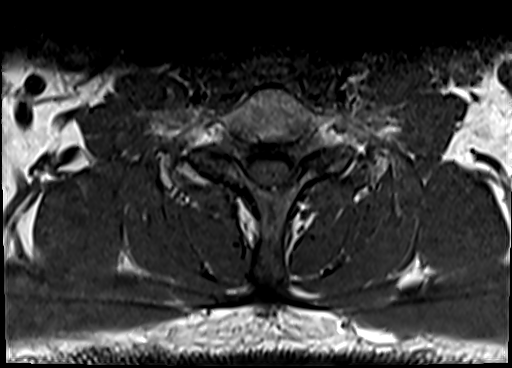
[im 9/32]
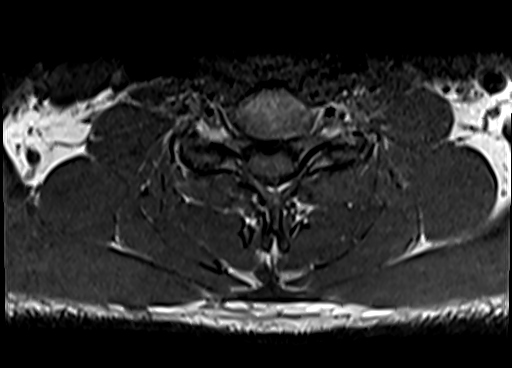
[im 14/32]
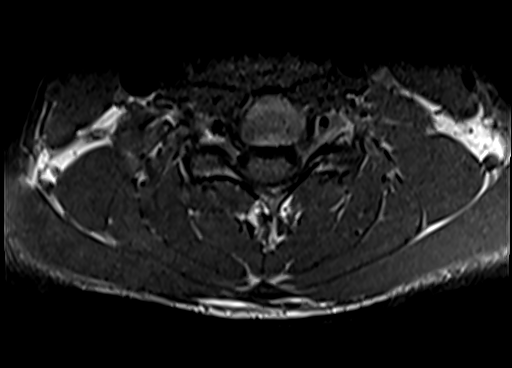
[im 18/32]
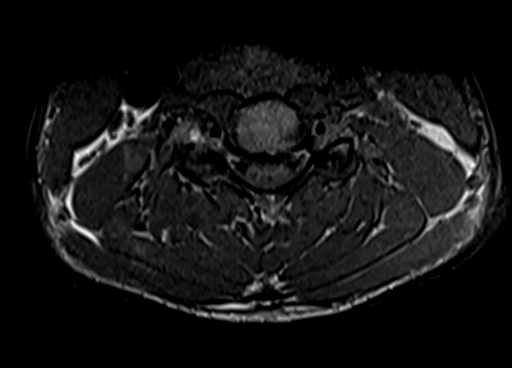
[im 23/32]
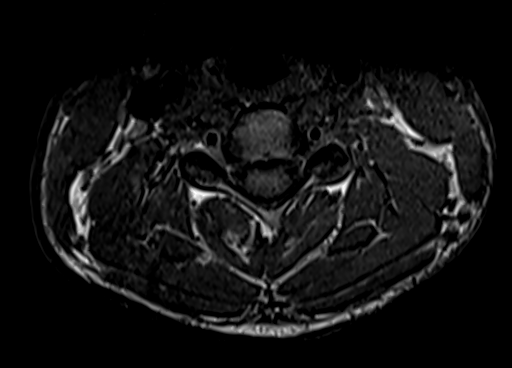
[im 27/32]
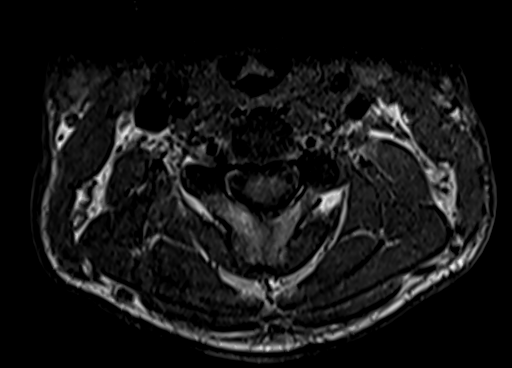
[im 32/32]
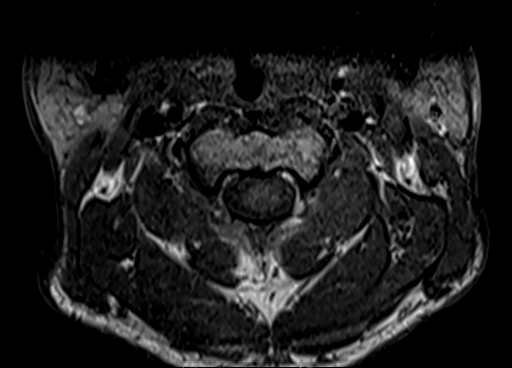

[Series 11: T2 · sagittal · 3.0mm · 0.55mm/px · 4 of 15 slices shown (2 of 2)]
[im 1/15]
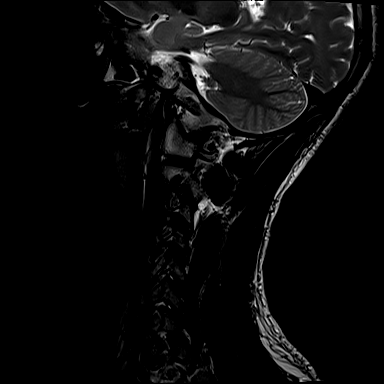
[im 5/15]
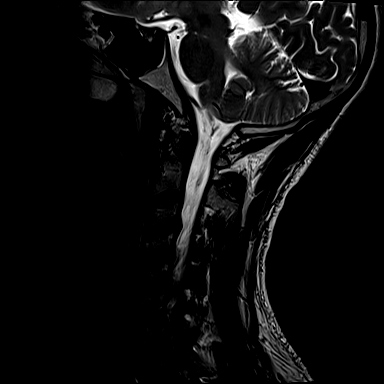
[im 10/15]
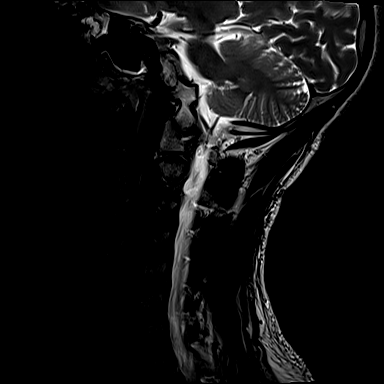
[im 15/15]
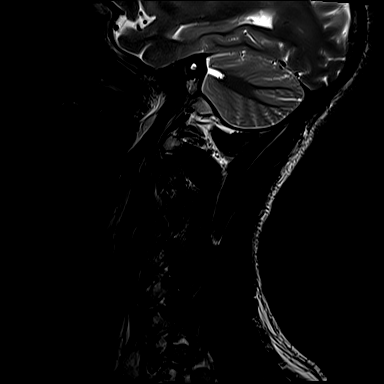

[Series 13: T1 · axial · 3.0mm · 0.31mm/px · z∈[-68,+32]mm · 8 of 32 slices shown (3 of 3)]
[im 1/32]
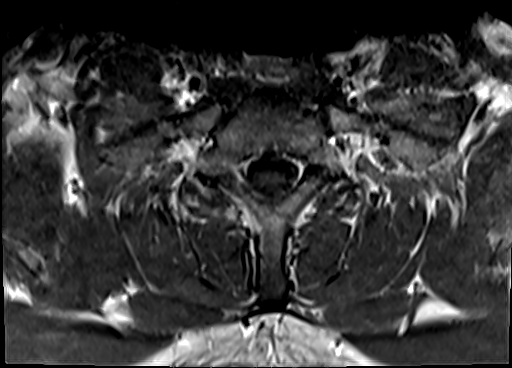
[im 5/32]
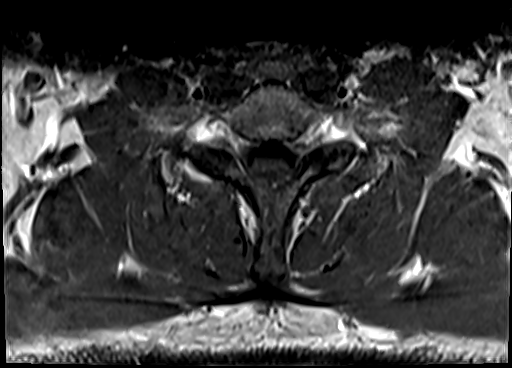
[im 9/32]
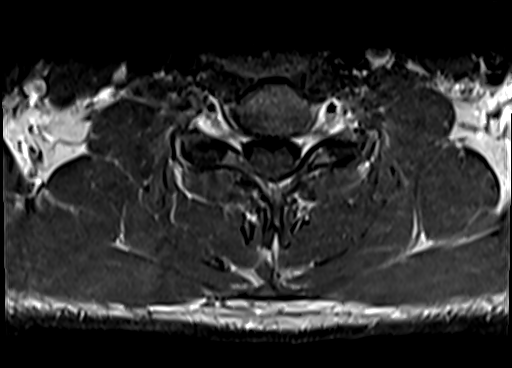
[im 14/32]
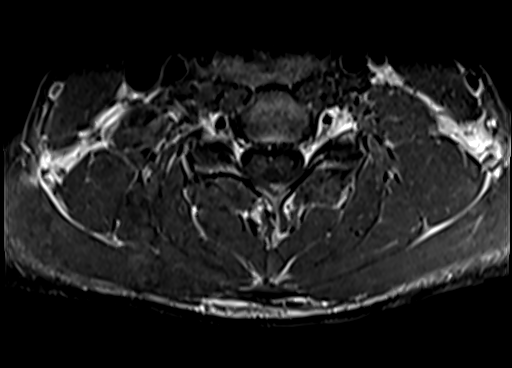
[im 18/32]
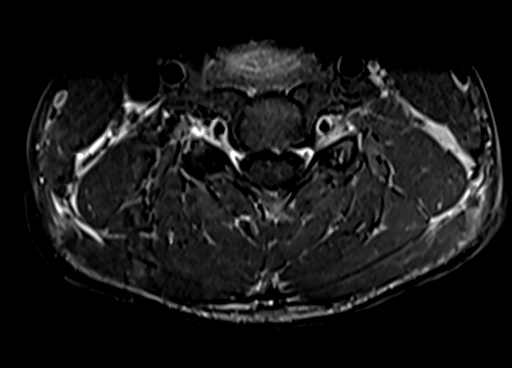
[im 23/32]
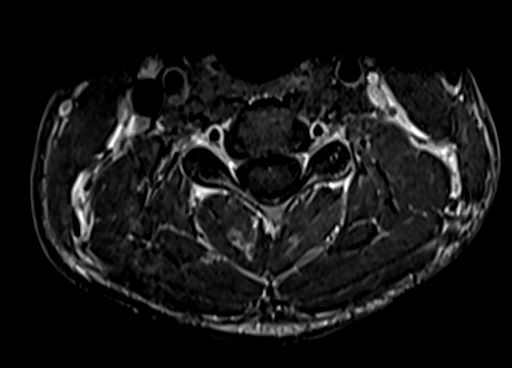
[im 27/32]
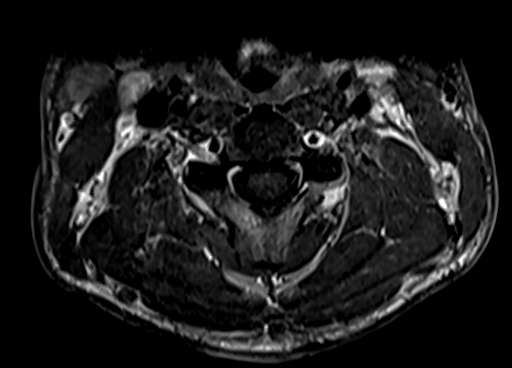
[im 32/32]
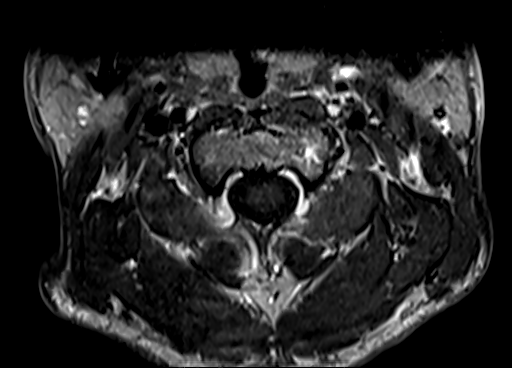

[30 of 48 positions shown; findings below may reference images not displayed]

FINDINGS: Alignment: Straightening of the normal cervical lordosis. No
listhesis.

Vertebrae: Vertebral body height maintained without acute or chronic
fracture. Bone marrow signal intensity within normal limits. No
discrete or worrisome osseous lesions. No abnormal marrow edema or
enhancement.

Cord: Focus of hazy ill-defined signal abnormality seen involving
the cord at the level of C4 (series 10, image 11), consistent with
demyelinating disease/multiple sclerosis. No associated enhancement
to suggest active demyelination. Otherwise, appearance of the cord
is normal with no other signal abnormality. Cord caliber and
morphology is normal.

Posterior Fossa, vertebral arteries, paraspinal tissues:
Craniocervical junction within normal limits. Paraspinous and
prevertebral soft tissues normal. Normal flow voids seen within the
vertebral arteries bilaterally.

Disc levels:

C2-C3: Mild facet hypertrophy. Otherwise unremarkable. No stenosis.

C3-C4: Disc desiccation with mild uncovertebral spurring. No
significant stenosis.

C4-C5: Mild disc bulge with disc desiccation. Small central disc
protrusion indents the ventral thecal sac. No spinal stenosis.
Foramina remain patent.

C5-C6: Small central disc protrusion indents the ventral thecal sac.
Associated annular fissure. No significant spinal stenosis.
Right-sided uncovertebral spurring with resultant mild right C6
foraminal stenosis. Left neural foramina remains patent.

C6-C7:  Unremarkable.

C7-T1:  Unremarkable.

Visualized upper thoracic spine demonstrates no significant finding.
IMPRESSION: 1. Focus of hazy signal abnormality involving the cord at the level
of C4, consistent with demyelinating disease/multiple sclerosis. No
associated enhancement to suggest active demyelination.
2. No other spinal cord involvement of demyelinating disease
identified.
3. Small central disc protrusions at C4-5 and C5-6 without
significant stenosis.

## 2021-04-09 MED ORDER — GADOBENATE DIMEGLUMINE 529 MG/ML IV SOLN
19.0000 mL | Freq: Once | INTRAVENOUS | Status: AC | PRN
  Administered 2021-04-09: 19 mL via INTRAVENOUS

## 2021-10-19 ENCOUNTER — Emergency Department
Admission: EM | Admit: 2021-10-19 | Discharge: 2021-10-19 | Discharge status: Against Medical Advice | Payer: Managed Care, Other (non HMO)

## 2021-10-19 NOTE — ED Notes (Signed)
Called several times from lobby with no answer 

## 2021-10-19 NOTE — ED Notes (Signed)
No answer when called several times from lobby 

## 2021-10-24 IMAGING — CR DG ABDOMEN 1V
1 series · 2 of 2 positions shown · non-contrast
Comparison: [DATE]

CLINICAL DATA: Left flank pain

EXAM:
ABDOMEN - 1 VIEW

[Series 1: dg abd 1 view · 0.14mm/px · 2 of 2 slices shown]
[im 1/2]
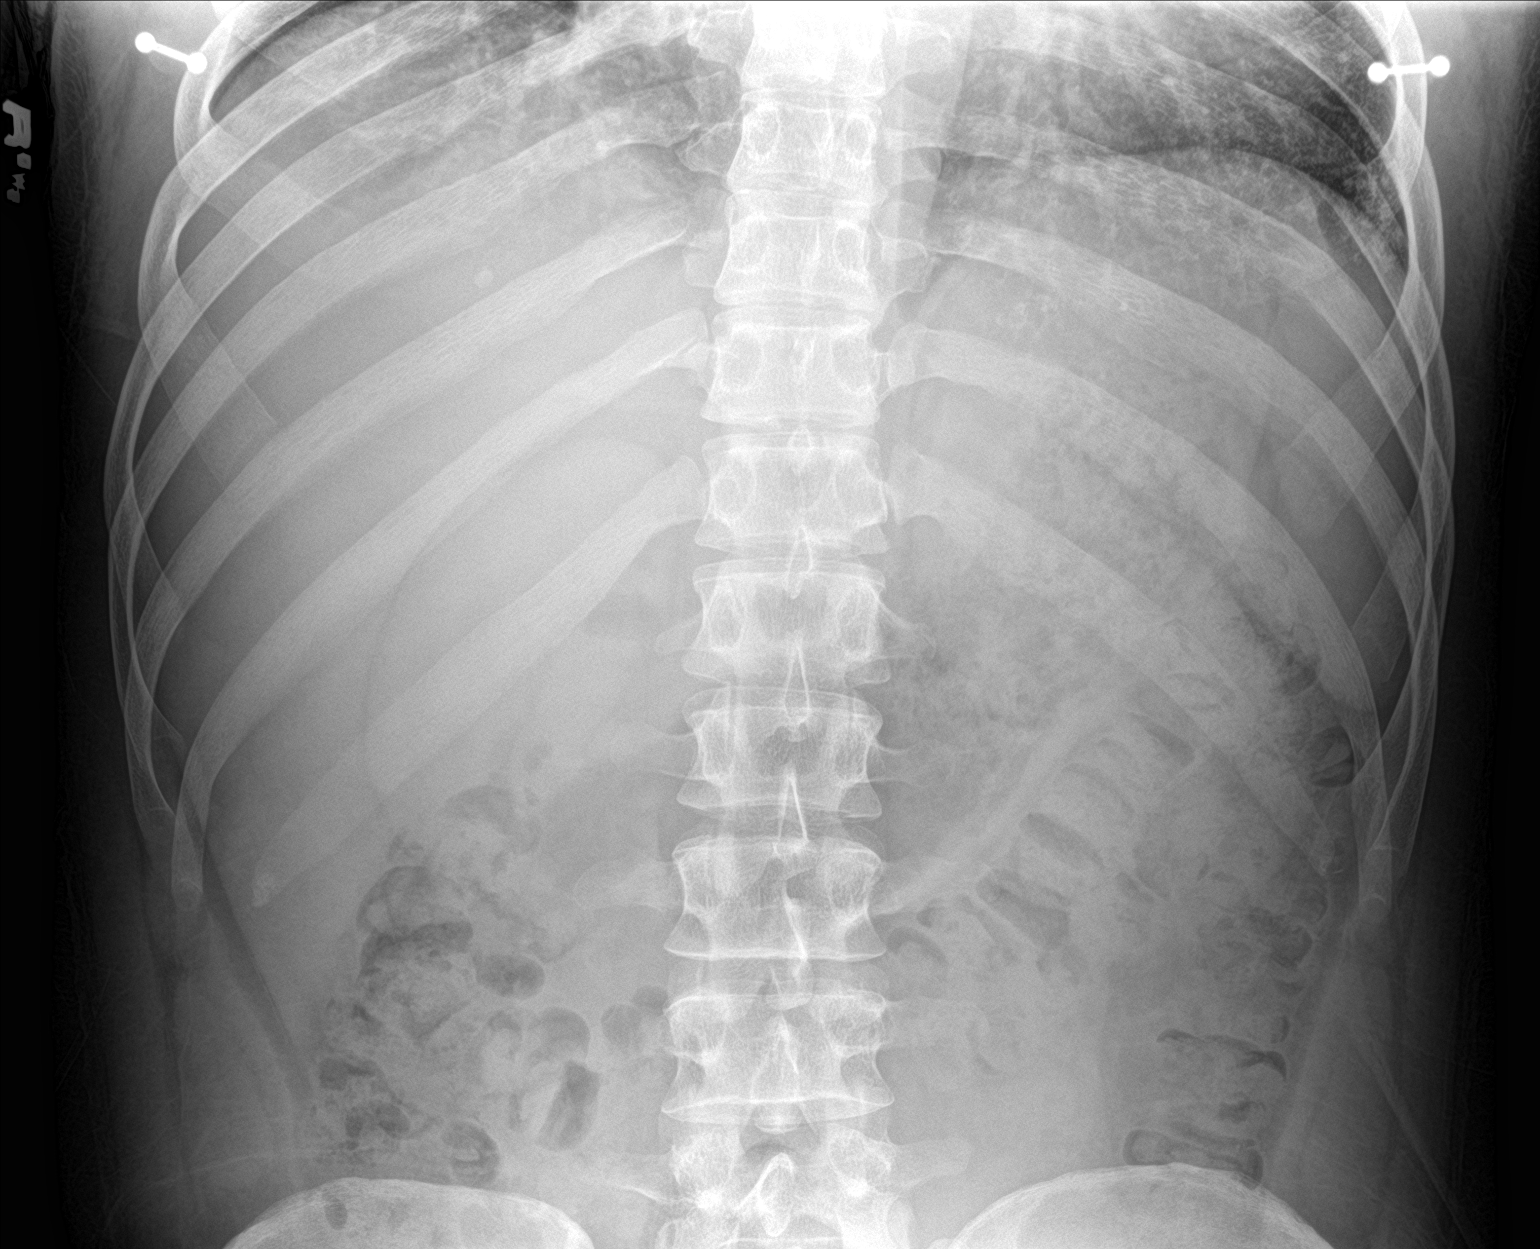
[im 2/2]
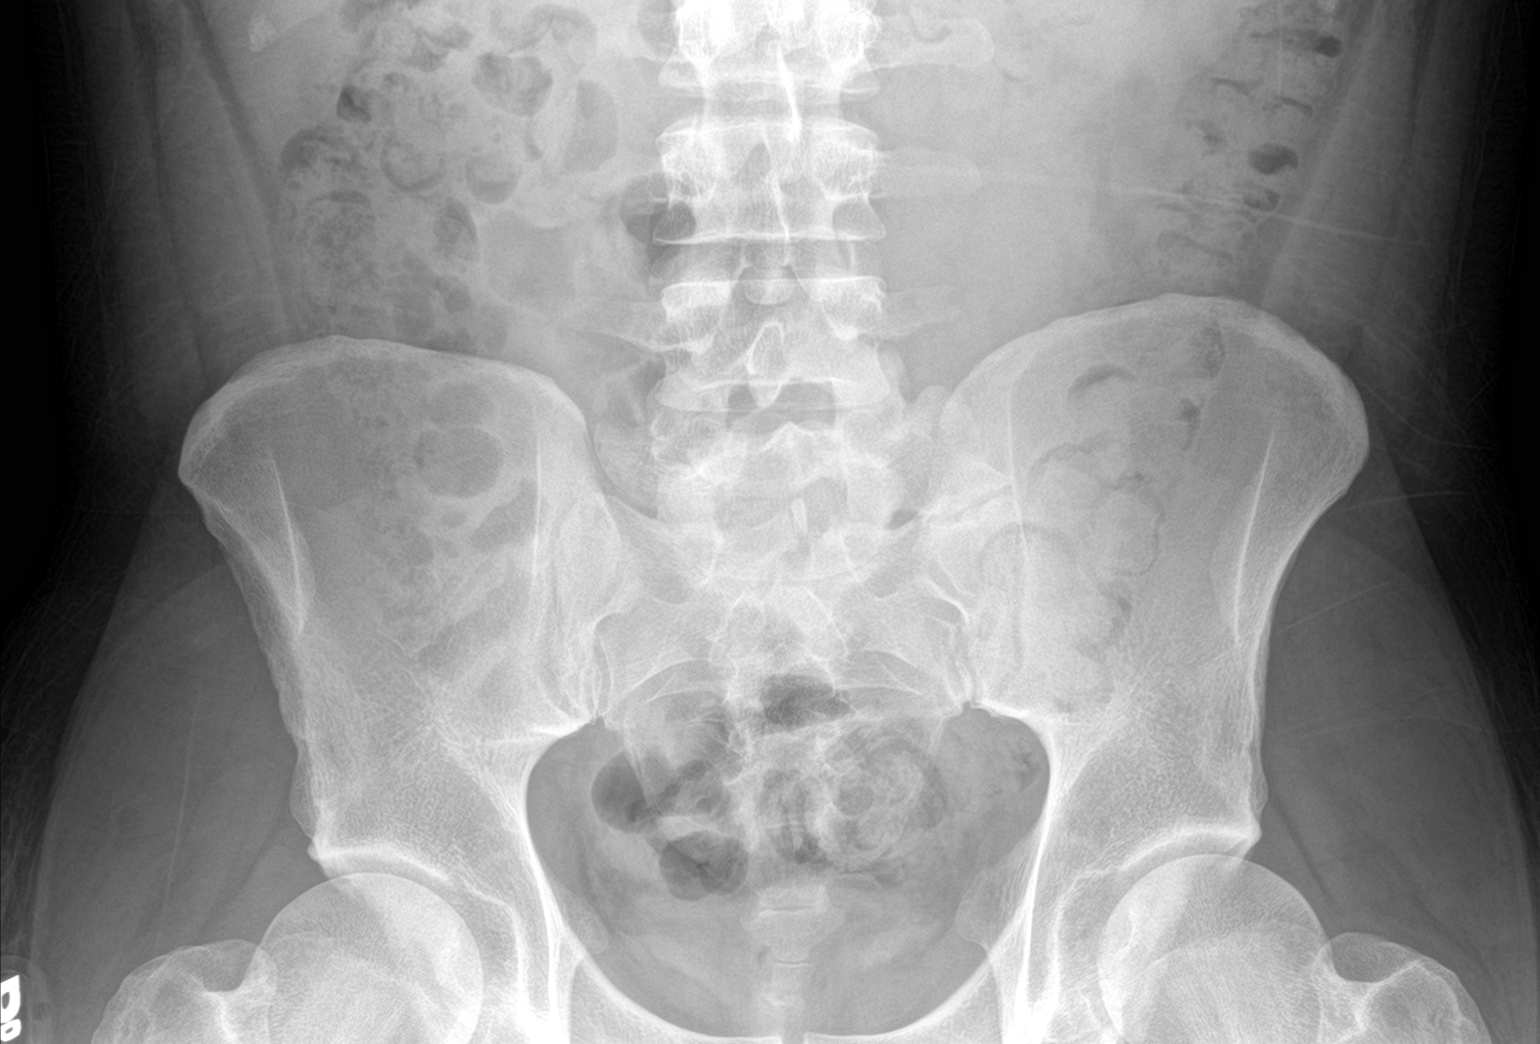

[2 of 2 positions shown; findings below may reference images not displayed]

FINDINGS: The bowel gas pattern is normal. No radio-opaque calculi or other
significant radiographic abnormality are seen. Moderate volume stool
projects throughout the colon. No acute bony findings.
IMPRESSION: Negative.

## 2021-10-25 ENCOUNTER — Other Ambulatory Visit: Payer: Self-pay | Admitting: Family Medicine

## 2021-10-25 DIAGNOSIS — R82998 Other abnormal findings in urine: Secondary | ICD-10-CM

## 2021-10-25 DIAGNOSIS — R1013 Epigastric pain: Secondary | ICD-10-CM

## 2021-10-26 ENCOUNTER — Other Ambulatory Visit: Payer: Self-pay

## 2021-10-26 DIAGNOSIS — N390 Urinary tract infection, site not specified: Secondary | ICD-10-CM | POA: Diagnosis not present

## 2021-10-26 DIAGNOSIS — R109 Unspecified abdominal pain: Secondary | ICD-10-CM | POA: Diagnosis not present

## 2021-10-26 DIAGNOSIS — Z5321 Procedure and treatment not carried out due to patient leaving prior to being seen by health care provider: Secondary | ICD-10-CM | POA: Insufficient documentation

## 2021-10-26 DIAGNOSIS — D72829 Elevated white blood cell count, unspecified: Secondary | ICD-10-CM | POA: Diagnosis not present

## 2021-10-26 DIAGNOSIS — Z20822 Contact with and (suspected) exposure to covid-19: Secondary | ICD-10-CM | POA: Insufficient documentation

## 2021-10-26 DIAGNOSIS — J181 Lobar pneumonia, unspecified organism: Secondary | ICD-10-CM | POA: Diagnosis not present

## 2021-10-26 DIAGNOSIS — D649 Anemia, unspecified: Secondary | ICD-10-CM | POA: Insufficient documentation

## 2021-10-26 DIAGNOSIS — N1330 Unspecified hydronephrosis: Secondary | ICD-10-CM | POA: Diagnosis not present

## 2021-10-26 DIAGNOSIS — N201 Calculus of ureter: Secondary | ICD-10-CM | POA: Diagnosis not present

## 2021-10-26 DIAGNOSIS — R350 Frequency of micturition: Secondary | ICD-10-CM | POA: Insufficient documentation

## 2021-10-26 DIAGNOSIS — N200 Calculus of kidney: Secondary | ICD-10-CM | POA: Insufficient documentation

## 2021-10-26 DIAGNOSIS — R32 Unspecified urinary incontinence: Secondary | ICD-10-CM | POA: Insufficient documentation

## 2021-10-26 DIAGNOSIS — R300 Dysuria: Secondary | ICD-10-CM | POA: Diagnosis present

## 2021-10-26 LAB — BASIC METABOLIC PANEL
Anion gap: 9 (ref 5–15)
BUN: 12 mg/dL (ref 6–20)
CO2: 27 mmol/L (ref 22–32)
Calcium: 9.4 mg/dL (ref 8.9–10.3)
Chloride: 100 mmol/L (ref 98–111)
Creatinine, Ser: 0.94 mg/dL (ref 0.61–1.24)
GFR, Estimated: >60 mL/min (ref >60)
Glucose, Bld: 102 mg/dL — ABNORMAL HIGH (ref 70–99)
Potassium: 3.9 mmol/L (ref 3.5–5.1)
Sodium: 136 mmol/L (ref 135–145)

## 2021-10-26 LAB — CBC
HCT: 37.8 % — ABNORMAL LOW (ref 39.0–52.0)
Hemoglobin: 12.7 g/dL — ABNORMAL LOW (ref 13.0–17.0)
MCH: 28.4 pg (ref 26.0–34.0)
MCHC: 33.6 g/dL (ref 30.0–36.0)
MCV: 84.6 fL (ref 80.0–100.0)
Platelets: 228 10*3/uL (ref 150–400)
RBC: 4.47 MIL/uL (ref 4.22–5.81)
RDW: 11.9 % (ref 11.5–15.5)
WBC: 14.2 10*3/uL — ABNORMAL HIGH (ref 4.0–10.5)
nRBC: 0 % (ref 0.0–0.2)

## 2021-10-26 LAB — URINALYSIS, ROUTINE W REFLEX MICROSCOPIC
Bacteria, UA: NONE SEEN
Bilirubin Urine: NEGATIVE
Glucose, UA: NEGATIVE mg/dL
Ketones, ur: NEGATIVE mg/dL
Nitrite: NEGATIVE
Protein, ur: NEGATIVE mg/dL
Specific Gravity, Urine: 1.003 — ABNORMAL LOW (ref 1.005–1.030)
Squamous Epithelial / HPF: NONE SEEN (ref 0–5)
WBC, UA: >50 WBC/hpf — ABNORMAL HIGH (ref 0–5)
pH: 9 — ABNORMAL HIGH (ref 5.0–8.0)

## 2021-10-26 NOTE — ED Triage Notes (Signed)
Pt reports dysuria and left sided flank pain since April 5th. Was seen at [facility] clinic but test were not conclusive. Hx of MS and has had urinary continence issues but now is having urinary urgency and a weak stream. ?

## 2021-10-27 ENCOUNTER — Emergency Department
Admission: EM | Admit: 2021-10-27 | Discharge: 2021-10-27 | Discharge status: Against Medical Advice | Payer: Managed Care, Other (non HMO) | Attending: Emergency Medicine | Admitting: Emergency Medicine

## 2021-10-27 ENCOUNTER — Emergency Department: Payer: Managed Care, Other (non HMO)

## 2021-10-27 ENCOUNTER — Emergency Department (EMERGENCY_DEPARTMENT_HOSPITAL)
Admission: EM | Admit: 2021-10-27 | Discharge: 2021-10-27 | Disposition: A | Discharge status: Home / Self Care | Payer: Managed Care, Other (non HMO) | Source: Home / Self Care | Attending: Emergency Medicine | Admitting: Emergency Medicine

## 2021-10-27 ENCOUNTER — Other Ambulatory Visit: Payer: Self-pay

## 2021-10-27 DIAGNOSIS — N200 Calculus of kidney: Secondary | ICD-10-CM | POA: Insufficient documentation

## 2021-10-27 DIAGNOSIS — N1330 Unspecified hydronephrosis: Secondary | ICD-10-CM | POA: Diagnosis not present

## 2021-10-27 DIAGNOSIS — D649 Anemia, unspecified: Secondary | ICD-10-CM | POA: Insufficient documentation

## 2021-10-27 DIAGNOSIS — N201 Calculus of ureter: Secondary | ICD-10-CM

## 2021-10-27 DIAGNOSIS — N390 Urinary tract infection, site not specified: Secondary | ICD-10-CM | POA: Insufficient documentation

## 2021-10-27 DIAGNOSIS — D72829 Elevated white blood cell count, unspecified: Secondary | ICD-10-CM | POA: Insufficient documentation

## 2021-10-27 DIAGNOSIS — Z20822 Contact with and (suspected) exposure to covid-19: Secondary | ICD-10-CM | POA: Insufficient documentation

## 2021-10-27 DIAGNOSIS — J690 Pneumonitis due to inhalation of food and vomit: Secondary | ICD-10-CM

## 2021-10-27 DIAGNOSIS — J181 Lobar pneumonia, unspecified organism: Secondary | ICD-10-CM | POA: Insufficient documentation

## 2021-10-27 LAB — COMPREHENSIVE METABOLIC PANEL
ALT: 13 U/L (ref 0–44)
AST: 12 U/L — ABNORMAL LOW (ref 15–41)
Albumin: 4.8 g/dL (ref 3.5–5.0)
Alkaline Phosphatase: 52 U/L (ref 38–126)
Anion gap: 5 (ref 5–15)
BUN: 15 mg/dL (ref 6–20)
CO2: 28 mmol/L (ref 22–32)
Calcium: 9.4 mg/dL (ref 8.9–10.3)
Chloride: 101 mmol/L (ref 98–111)
Creatinine, Ser: 0.8 mg/dL (ref 0.61–1.24)
GFR, Estimated: >60 mL/min (ref >60)
Glucose, Bld: 109 mg/dL — ABNORMAL HIGH (ref 70–99)
Potassium: 4 mmol/L (ref 3.5–5.1)
Sodium: 134 mmol/L — ABNORMAL LOW (ref 135–145)
Total Bilirubin: 1.3 mg/dL — ABNORMAL HIGH (ref 0.3–1.2)
Total Protein: 7.9 g/dL (ref 6.5–8.1)

## 2021-10-27 LAB — CBC
HCT: 38.6 % — ABNORMAL LOW (ref 39.0–52.0)
Hemoglobin: 12.7 g/dL — ABNORMAL LOW (ref 13.0–17.0)
MCH: 28 pg (ref 26.0–34.0)
MCHC: 32.9 g/dL (ref 30.0–36.0)
MCV: 85 fL (ref 80.0–100.0)
Platelets: 236 10*3/uL (ref 150–400)
RBC: 4.54 MIL/uL (ref 4.22–5.81)
RDW: 11.9 % (ref 11.5–15.5)
WBC: 13.7 10*3/uL — ABNORMAL HIGH (ref 4.0–10.5)
nRBC: 0 % (ref 0.0–0.2)

## 2021-10-27 LAB — LIPASE, BLOOD: Lipase: 25 U/L (ref 11–51)

## 2021-10-27 LAB — PROCALCITONIN: Procalcitonin: <0.1 ng/mL

## 2021-10-27 LAB — RESP PANEL BY RT-PCR (FLU A&B, COVID) ARPGX2
Influenza A by PCR: NEGATIVE
Influenza B by PCR: NEGATIVE
SARS Coronavirus 2 by RT PCR: NEGATIVE

## 2021-10-27 IMAGING — CT CT RENAL STONE PROTOCOL
2 of 4 series · 15 of 46 positions shown, 17 images · non-contrast
Comparison: No priors.

CLINICAL DATA: 30-year-old male with history of left-sided flank
pain, dysuria and hematuria. Suspected kidney stone.



[Series 2: ap without · axial · non-contrast · 0.77mm/px · z∈[-1028,-583]mm · 12 of 101 slices shown, 14 images]
[im 6/101  soft-tissue]
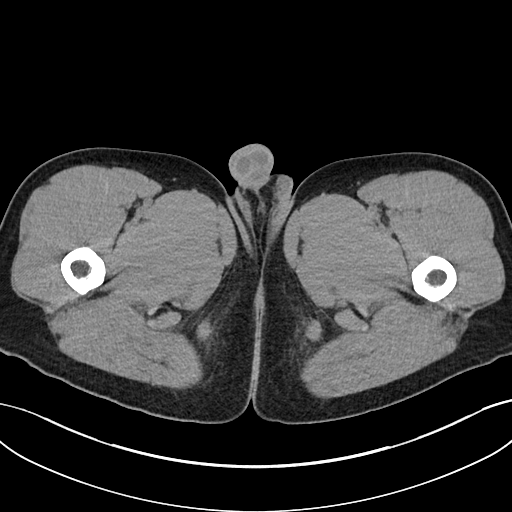
[im 6/101  bone]
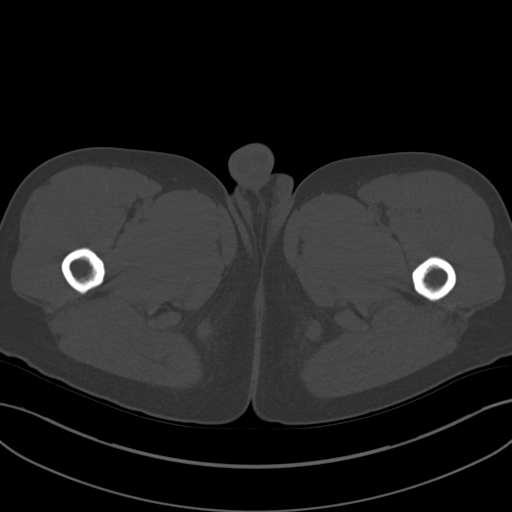
[im 16/101  soft-tissue]
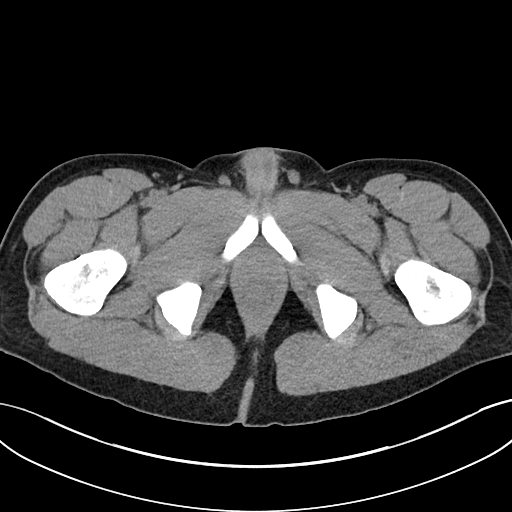
[im 22/101  soft-tissue]
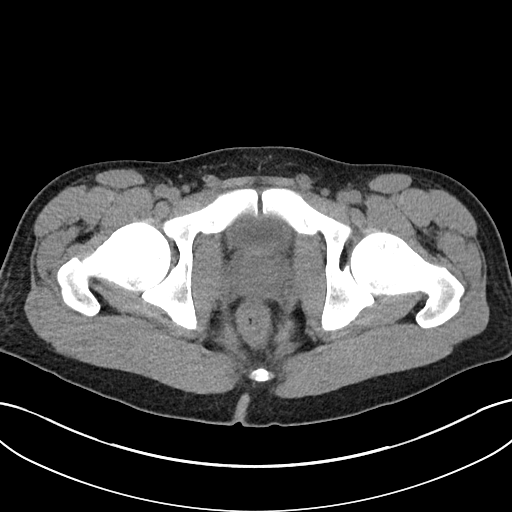
[im 32/101  soft-tissue]
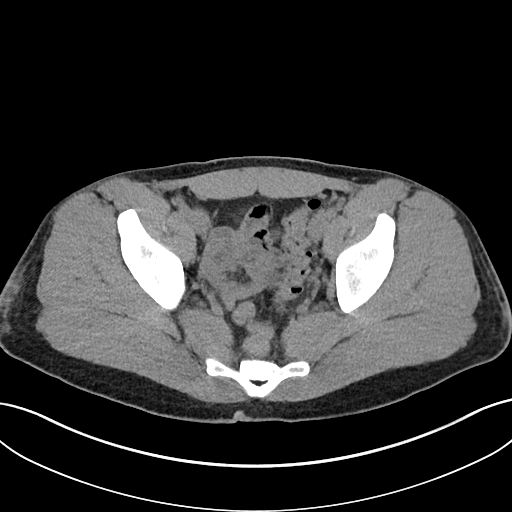
[im 37/101  soft-tissue]
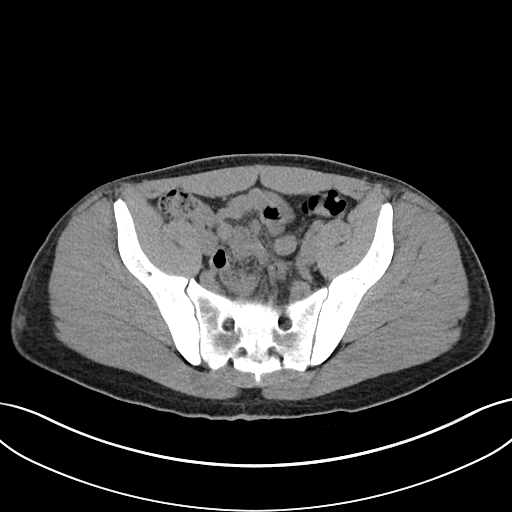
[im 48/101  soft-tissue]
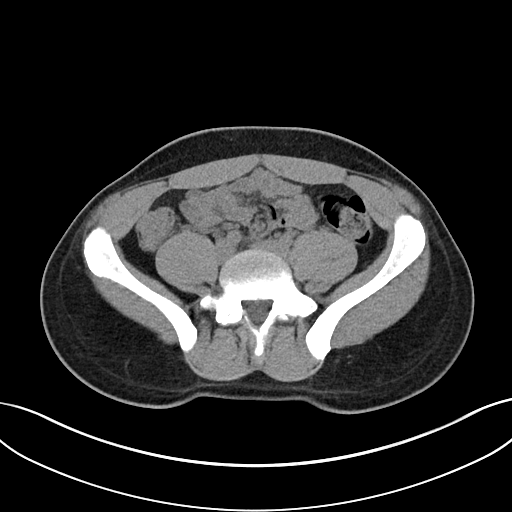
[im 53/101  soft-tissue]
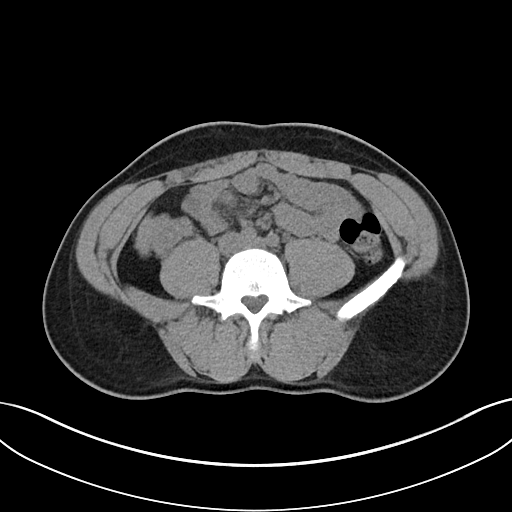
[im 64/101  soft-tissue]
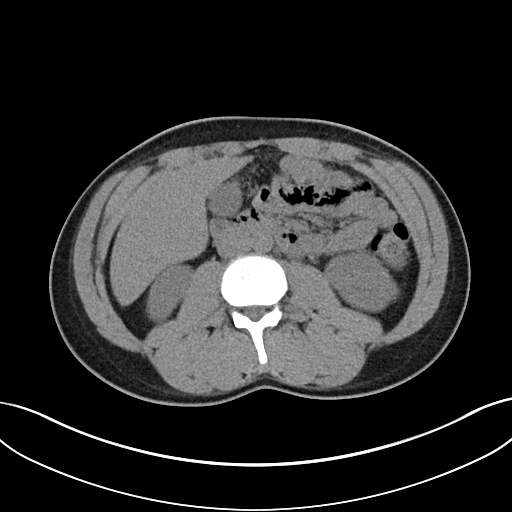
[im 69/101  soft-tissue]
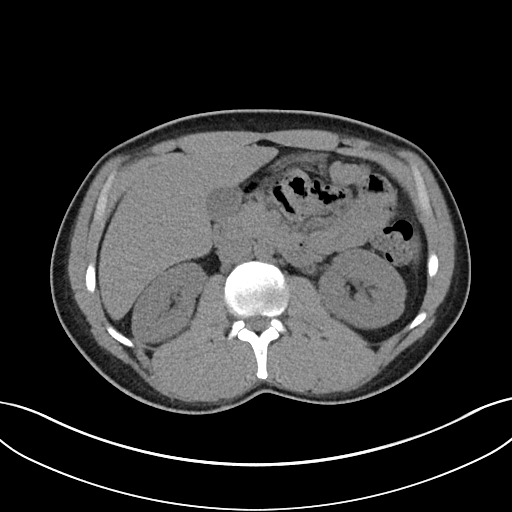
[im 69/101  bone]
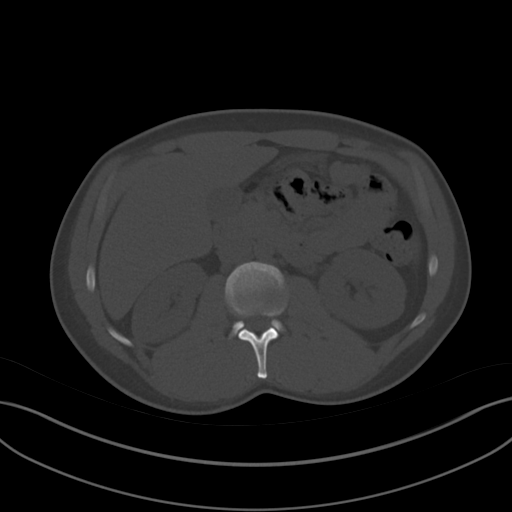
[im 79/101  soft-tissue]
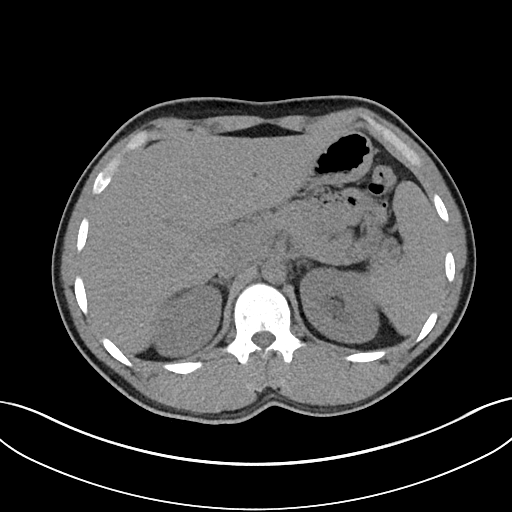
[im 85/101  soft-tissue]
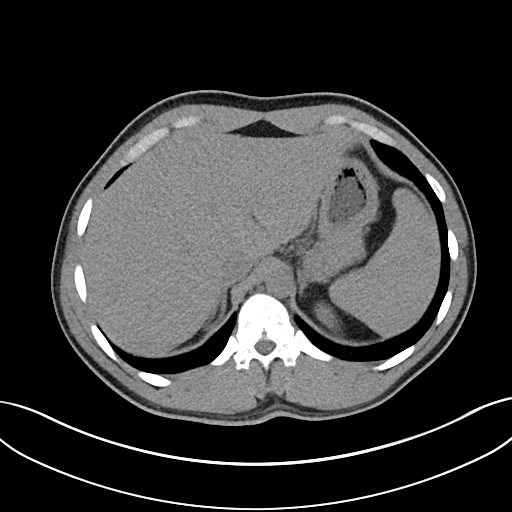
[im 95/101  soft-tissue]
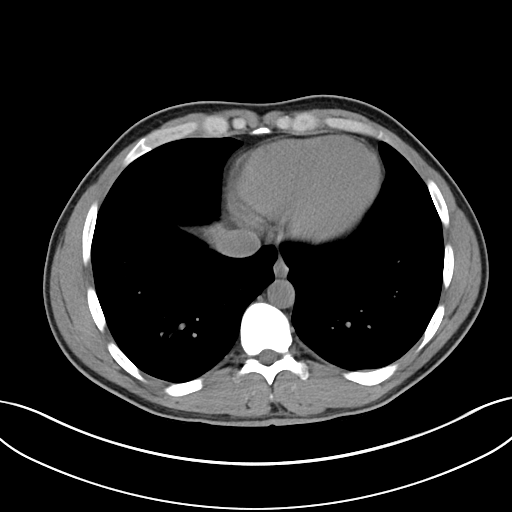

[Series 5: cor · coronal · 0.85mm/px · 3 of 88 slices shown]
[im 30/88  soft-tissue]
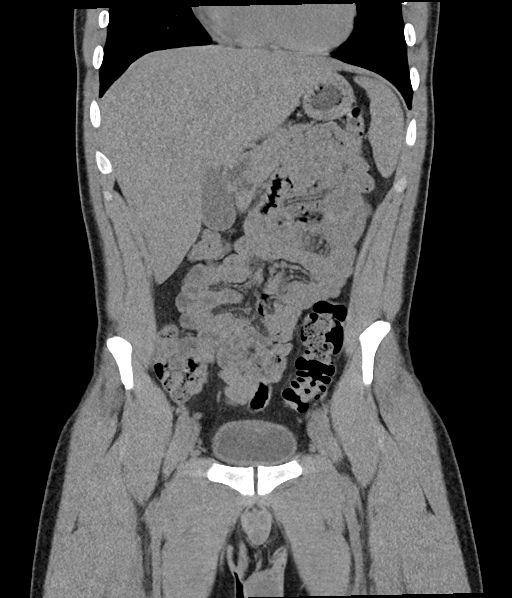
[im 39/88  soft-tissue]
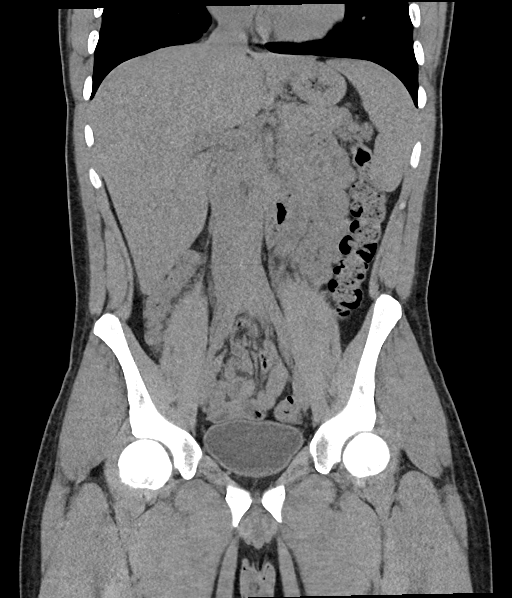
[im 49/88  soft-tissue]
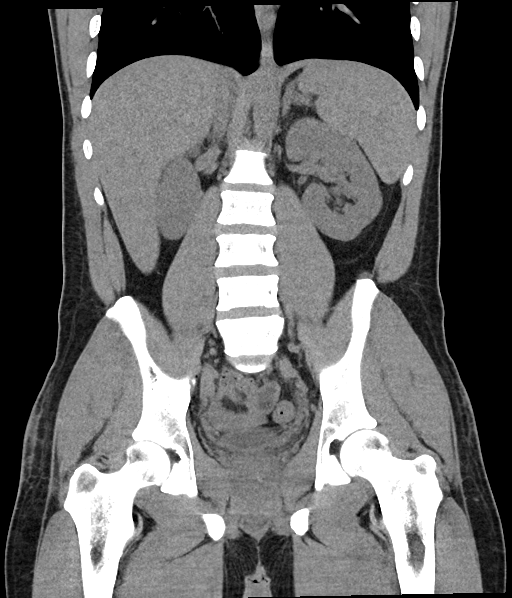

[15 of 46 positions shown; findings below may reference images not displayed]

FINDINGS: Lower chest: Patchy areas of peribronchovascular ground-glass
attenuation micro nodularity are noted throughout the visualized
left lung base.

Hepatobiliary: No definite suspicious cystic or solid hepatic
lesions are confidently identified on today's noncontrast CT
examination. Unenhanced appearance of the gallbladder is
unremarkable.

Pancreas: No definite pancreatic mass or peripancreatic fluid
collections or inflammatory changes are noted on today's noncontrast
CT examination.

Spleen: Unremarkable.

Adrenals/Urinary Tract: In the distal third of the left ureter
(axial image 75 of series 2) immediately before the left
ureterovesicular junction there is a 4 mm calculus which is
associated with minimal fullness in the left ureter and left renal
collecting system indicative of very mild obstruction. No additional
calculi are noted elsewhere in the collecting systems of either
kidney, along the course of the right ureter or within the lumen of
the urinary bladder. No right hydroureteronephrosis. Bilateral
adrenal glands are normal in appearance. Urinary bladder is normal
in appearance.

Stomach/Bowel: Unenhanced appearance of the stomach is normal. No
pathologic dilatation of small bowel or colon. The appendix is not
confidently identified and may be surgically absent. Regardless,
there are no inflammatory changes noted adjacent to the cecum to
suggest the presence of an acute appendicitis at this time.

Vascular/Lymphatic: No significant atherosclerotic disease, aneurysm
or dissection noted in the abdominal or pelvic vasculature. No
lymphadenopathy noted in the abdomen or pelvis.

Reproductive: Prostate gland and seminal vesicles are unremarkable
in appearance.

Other: No significant volume of ascites.  No pneumoperitoneum.

Musculoskeletal: There are no aggressive appearing lytic or blastic
lesions noted in the visualized portions of the skeleton.
IMPRESSION: 1. 4 mm partially obstructing calculus in the distal third of the
left ureter shortly before the left ureterovesicular junction with
minimal fullness of the left ureter and left renal collecting system
indicative of very mild obstruction.
2. Patchy peribronchovascular ground-glass attenuation micro
nodularity in the base of the left lower lobe and inferior segment
of the lingula. This could reflect sequela of mild aspiration if the
patient has had recent history of emesis. Alternatively, developing
bronchopneumonia should be considered.

## 2021-10-27 IMAGING — CR DG CHEST 2V
1 series · 2 of 2 positions shown · non-contrast
Comparison: None.

CLINICAL DATA: Concern for infection

EXAM:
CHEST - 2 VIEW

[Series 1: dg chest 2 view · 0.14mm/px · 2 of 2 slices shown]
[im 1/2]
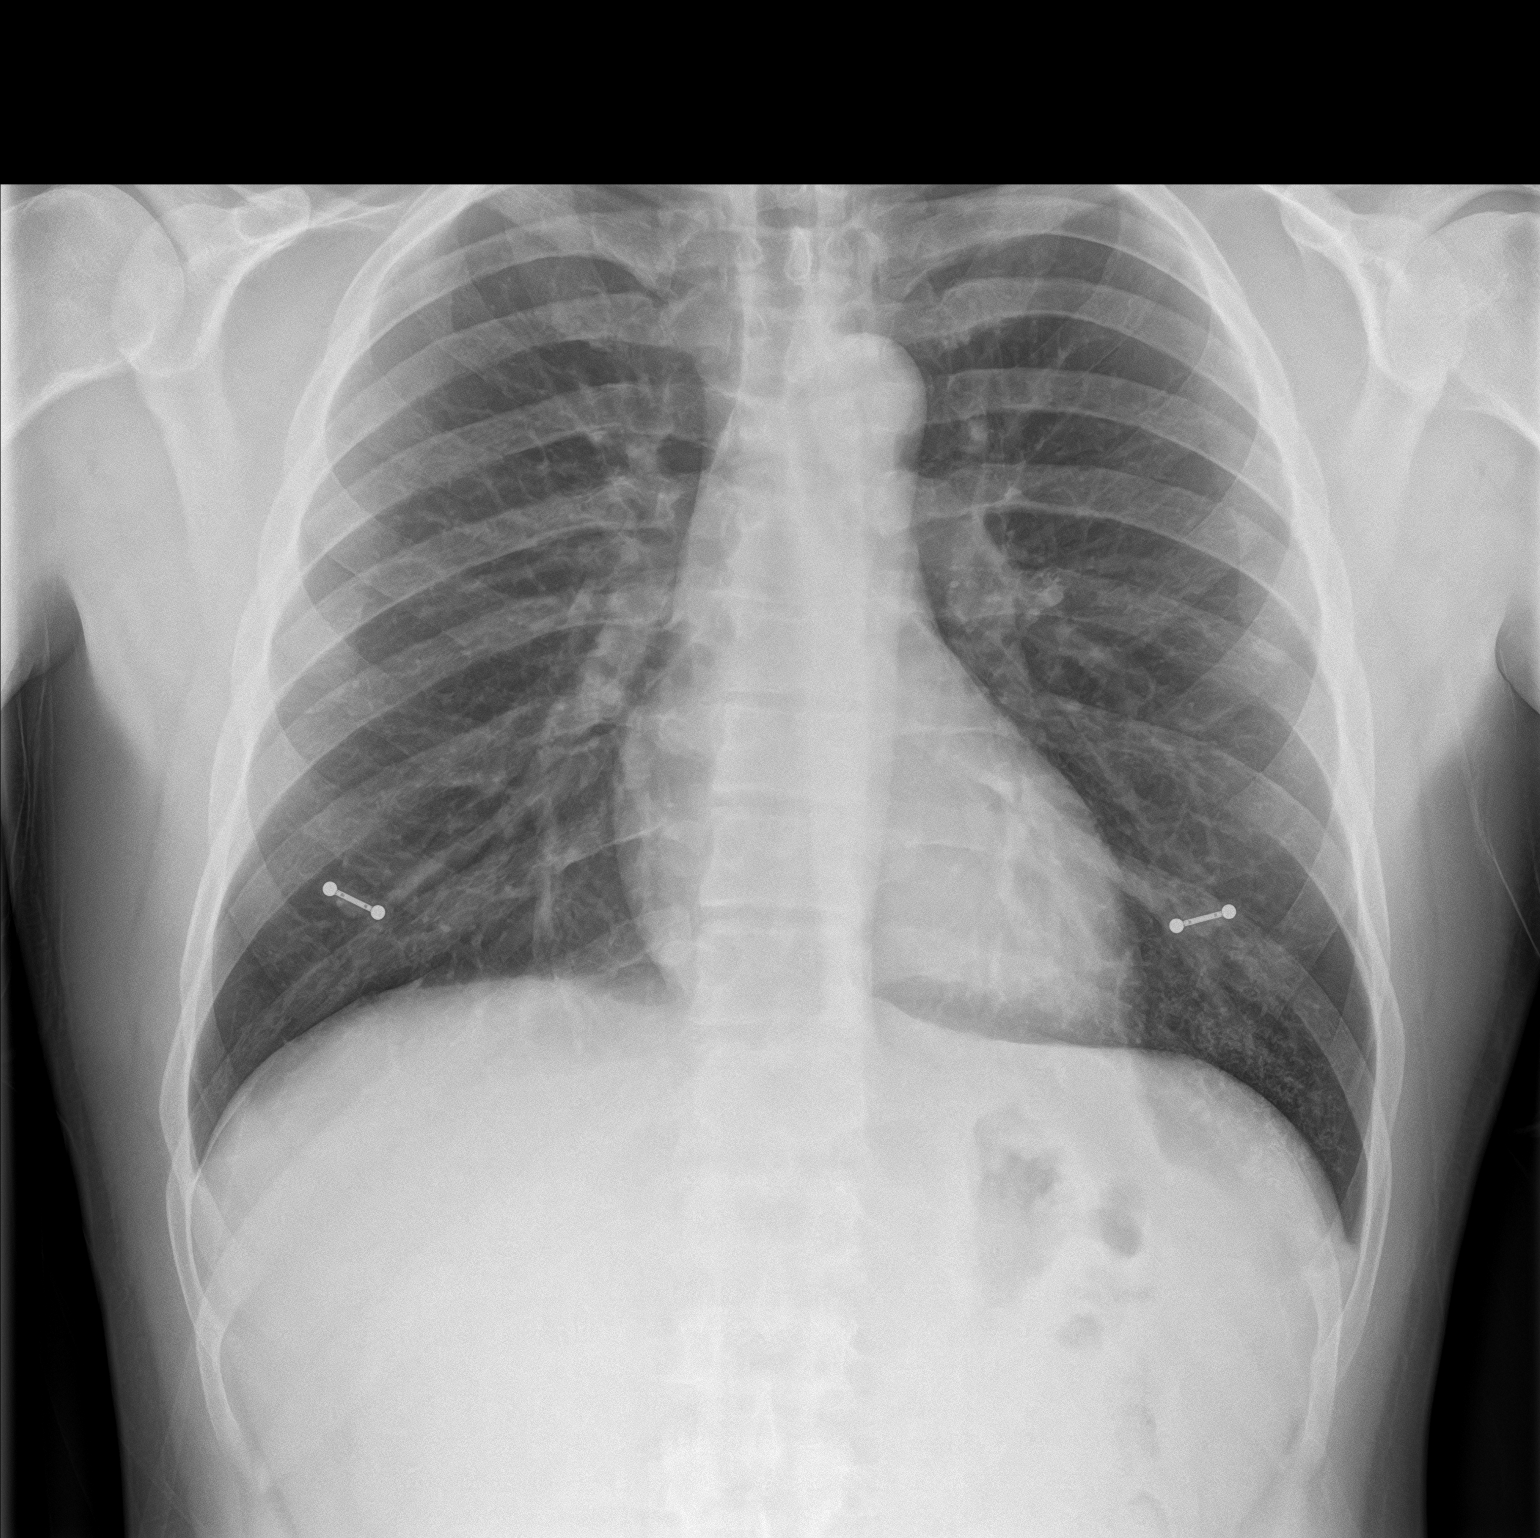
[im 2/2]
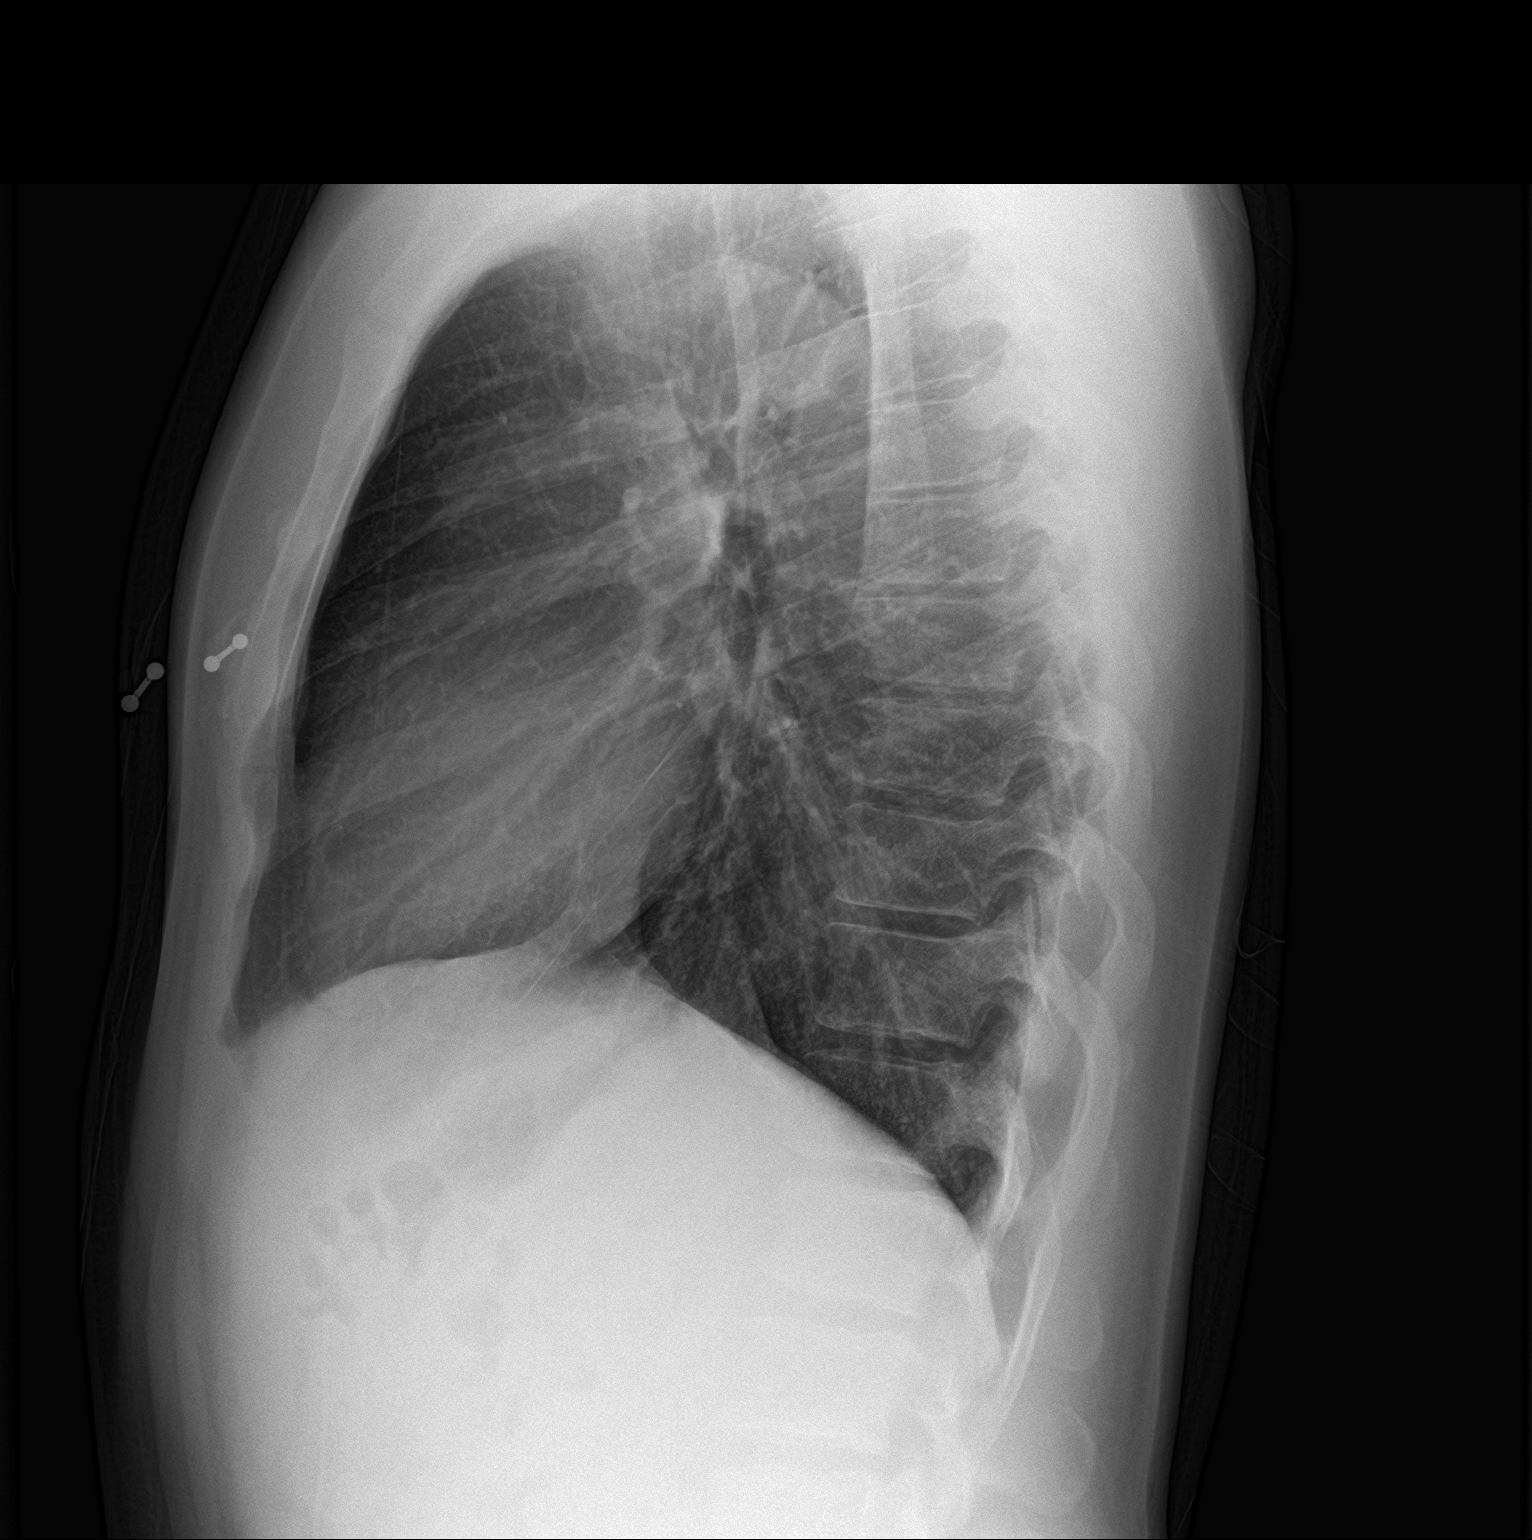

[2 of 2 positions shown; findings below may reference images not displayed]

FINDINGS: The heart size and mediastinal contours are within normal limits.
Subtle nodularity of the left lower lobe. Lungs otherwise clear. The
visualized skeletal structures are unremarkable.
IMPRESSION: Subtle nodularity of the left lower lobe, better evaluated on
same-day CT and likely due to aspiration or airways centered
infection.

## 2021-10-27 MED ORDER — OXYCODONE-ACETAMINOPHEN 5-325 MG PO TABS: 1.0000 | ORAL_TABLET | Freq: Four times a day (QID) | ORAL | 0 refills | Status: DC | PRN

## 2021-10-27 MED ORDER — SODIUM CHLORIDE 0.9 % IV BOLUS
1000.0000 mL | Freq: Once | INTRAVENOUS | Status: AC
  Administered 2021-10-27: 1000 mL via INTRAVENOUS

## 2021-10-27 MED ORDER — TAMSULOSIN HCL 0.4 MG PO CAPS: 0.4000 mg | ORAL_CAPSULE | Freq: Every day | ORAL | 0 refills | Status: DC

## 2021-10-27 MED ORDER — ONDANSETRON HCL 4 MG/2ML IJ SOLN
4.0000 mg | INTRAMUSCULAR | Status: DC
  Filled 2021-10-27: qty 2

## 2021-10-27 MED ORDER — AMOXICILLIN 500 MG PO CAPS: 500.0000 mg | ORAL_CAPSULE | Freq: Three times a day (TID) | ORAL | 0 refills | Status: DC

## 2021-10-27 MED ORDER — KETOROLAC TROMETHAMINE 30 MG/ML IJ SOLN
30.0000 mg | Freq: Once | INTRAMUSCULAR | Status: AC
  Administered 2021-10-27: 30 mg via INTRAVENOUS
  Filled 2021-10-27: qty 1

## 2021-10-27 MED ORDER — ONDANSETRON 4 MG PO TBDP: 4.0000 mg | ORAL_TABLET | Freq: Four times a day (QID) | ORAL | 0 refills | Status: DC | PRN

## 2021-10-27 MED ORDER — SODIUM CHLORIDE 0.9 % IV SOLN
2.0000 g | Freq: Once | INTRAVENOUS | Status: AC
  Administered 2021-10-27: 2 g via INTRAVENOUS
  Filled 2021-10-27: qty 20

## 2021-10-27 NOTE — ED Notes (Signed)
Pt transported to CT ?

## 2021-10-27 NOTE — ED Provider Notes (Signed)
? ?[facility] ?Provider Note ? ? ? Event Date/Time  ? First MD Initiated Contact with Patient 10/27/21 0805   ?  (approximate) ? ? ?History  ? ?Dysuria (/) and Back Pain ? ? ?HPI ? ?Damon Blair is a 31 y.o. male with a history of multiple sclerosis.  I also reviewed outside records including medical record from April 6 of this year where he was seen for acute abdominal symptoms.  Noted past medical history of multiple sclerosis ? ?Since April 6 has been experiencing abdominal pain he reports to be primarily along his left flank and is associated with pain and burning with urination.  ?  ?He has been rinsing low-grade fever about 100 Fahrenheit for the last 2 to 3 days.  He is come to the ER twice and reports he was not able to be seen due to long wait times.  Comes back in today hoping for shorter wait time.  Continues to experience a moderate pain in his left flank area also when symptoms started he noticed that his blood and urine seem like it was dark or bloody.  Has had burning with urination as well ? ?No vomiting.  Staying hydrated.  Pain has not worsened but located along the left lower back and flank region.  No weakness. ? ?Physical Exam  ? ?Triage Vital Signs: ?ED Triage Vitals  ?Enc Vitals Group  ?   BP 10/27/21 0750 116/62  ?   Pulse Rate 10/27/21 0750 87  ?   Resp 10/27/21 0750 16  ?   Temp 10/27/21 0750 98.7 ?F (37.1 ?C)  ?   Temp Source 10/27/21 0750 Oral  ?   SpO2 10/27/21 0750 99 %  ?   Weight 10/27/21 0743 179 lb 14.3 oz (81.6 kg)  ?   Height 10/27/21 0743 5\' 9"  (1.753 m)  ?   Head Circumference --   ?   Peak Flow --   ?   Pain Score 10/27/21 0743 4  ?   Pain Loc --   ?   Pain Edu? --   ?   Excl. in [place]? --   ? ? ?Most recent vital signs: ?Vitals:  ? 10/27/21 0915 10/27/21 1132  ?BP:  (!) 109/54  ?Pulse: 78 68  ?Resp:  18  ?Temp:    ?SpO2: 100% 98%  ? ? ? ?General: Awake, no distress.  ?CV:  Good peripheral perfusion.  Normal heart tones. ?Resp:  Normal effort.  Normal  lung sounds.  No cough or wheezing. ?Abd:  No distention.  Moderate left-sided costovertebral angle tenderness.  None noted on the right.  Mild pain to palpation in the deep left flank.  No rebound or guarding.  No right-sided abdominal pain.  No pain in the right upper quadrant.  No hernias or masses ?Other:   ? ? ?ED Results / Procedures / Treatments  ? ?Labs ?(all labs ordered are listed, but only abnormal results are displayed) ?Labs Reviewed  ?CBC - Abnormal; Notable for the following components:  ?    Result Value  ? WBC 13.7 (*)   ? Hemoglobin 12.7 (*)   ? HCT 38.6 (*)   ? All other components within normal limits  ?COMPREHENSIVE METABOLIC PANEL - Abnormal; Notable for the following components:  ? Sodium 134 (*)   ? Glucose, Bld 109 (*)   ? AST 12 (*)   ? Total Bilirubin 1.3 (*)   ? All other components within normal limits  ?RESP  PANEL BY RT-PCR (FLU A&B, COVID) [id number]  ?CULTURE, BLOOD (ROUTINE X 2)  ?CULTURE, BLOOD (ROUTINE X 2)  ?URINE CULTURE  ?LIPASE, BLOOD  ?PROCALCITONIN  ? ? ? ?EKG ? ? ? ? ?RADIOLOGY ? ?Personally reviewed and interpreted the patient's CT imaging of the abdomen pelvis for gross pathology, noted is what appears to be a kidney stone on the left distal ureter ? ?DG Chest 2 View ? ?Result Date: 10/27/2021 ?CLINICAL DATA:  Concern for infection EXAM: CHEST - 2 VIEW COMPARISON:  None. FINDINGS: The heart size and mediastinal contours are within normal limits. Subtle nodularity of the left lower lobe. Lungs otherwise clear. The visualized skeletal structures are unremarkable. IMPRESSION: Subtle nodularity of the left lower lobe, better evaluated on same-day CT and likely due to aspiration or airways centered infection. Electronically Signed   By: Yetta Glassman M.D.   On: 10/27/2021 10:09  ? ?CT Renal Stone Study ? IMPRESSION: 1. 4 mm partially obstructing calculus in the distal third of the left ureter shortly before the left ureterovesicular junction with minimal fullness of the left  ureter and left renal collecting system indicative of very mild obstruction. 2. Patchy peribronchovascular ground-glass attenuation micro nodularity in the base of the left lower lobe and inferior segment of the lingula. This could reflect sequela of mild aspiration if the patient has had recent history of emesis. Alternatively, developing bronchopneumonia should be considered. Electronically Signed   By: Vinnie Langton M.D.   On: 10/27/2021 08:54   ? ? ? ? ? ?PROCEDURES: ? ?Critical Care performed: No ? ?Procedures ? ? ?MEDICATIONS ORDERED IN ED: ?Medications  ?ondansetron (ZOFRAN) injection 4 mg (0 mg Intravenous Hold 10/27/21 0845)  ?cefTRIAXone (ROCEPHIN) 2 g in sodium chloride 0.9 % 100 mL IVPB (0 g Intravenous Stopped 10/27/21 0956)  ?sodium chloride 0.9 % bolus 1,000 mL (0 mLs Intravenous Stopped 10/27/21 0955)  ?ketorolac (TORADOL) 30 MG/ML injection 30 mg (30 mg Intravenous Given 10/27/21 1034)  ? ? ? ?IMPRESSION / MDM / ASSESSMENT AND PLAN / ED COURSE  ?I reviewed the triage vital signs and the nursing notes. ?             ?               ? ?Differential diagnosis includes, but is not limited to, urinary tract infection, pyelonephritis nephrolithiasis or other acute intra-abdominal pathology.  Given the patient's urinary symptoms and flank pain and discomfort high suspicion for urinary tract infection or nephrolithiasis or combination of both. Differential diagnosis includes but is not limited to, abdominal perforation, aortic dissection, cholecystitis, appendicitis, diverticulitis, colitis, esophagitis/gastritis, kidney stone, pyelonephritis, urinary tract infection, aortic aneurysm. All are considered in decision and treatment plan. Based upon the patient's presentation and risk factors, we will proceed with CT imaging of the abdomen pelvis. ? ?Also based on urinalysis from yesterday with leukocytes blood and elevated white blood cells will dose Rocephin.  Patient reports he is not allergic to penicillin  but only allergic to clavulanate.  IV fluid antiemetic and Toradol.  Labs notable for leukocytosis mild anemia.  Normal renal function. ? ?No clinical evidence to support severe sepsis or septic shock.  Initially slightly tachycardic on presentation but afebrile.  After receiving fluids and pain control heart rate normalized blood pressure normalized temperature normalized. ? ?The patient is on the cardiac monitor to evaluate for evidence of arrhythmia and/or significant heart rate changes. ? ?Clinical Course as of 10/27/21 1234  ?Thu Oct 27, 2021  ?[phone]  Consult paged to Dr. Diamantina [place] [MQ]  ?0947 Discussed the patient's case and clinical imaging with urologist Dr. Diamantina [place], he advises acceptable to give dose of Toradol for pain relief, and he will have his team see the patient and provide further consultation as to recommended treatments and disposition [MQ]  ?0948 Also, patient CT imaging shows possible bronchopneumonia, discussed with patient and he is not having any dyspnea cough or symptomatology would be highly suggestive of pneumonia.  I will add on a procalcitonin, COVID test, and a chest x-ray to further evaluate. [MQ]  ?1223 Patient resting comfortably.  Normal vital signs.  Reviewed careful return precautions regarding kidney stone urinary tract infection and pneumonia.  He is comfortable with follow-up, will prescribe amoxicillin which she reports he is not allergic to.  Additionally advised on careful follow-up. [MQ]  ?1223 I will prescribe the patient a narcotic pain medicine due to their condition which I anticipate will cause at least moderate pain short term. I discussed with the patient safe use of narcotic pain medicines, and that they are not to drive, work in dangerous areas, or ever take more than prescribed (no more than 1 pill every 6 hours). We discussed that this is the type of medication that can be  overdosed on and the risks of this type of medicine. Patient is very agreeable to only use as  prescribed and to never use more than prescribed. ? [MQ]  ?  ?Clinical Course User Index ?[MQ] Delman Kitten, MD  ? ?----------------------------------------- ?10:29 AM on 10/27/2021 ?------------------------

## 2021-10-27 NOTE — Consult Note (Signed)
?  ? ?Urology Consult  ? ?I have been asked to see the patient by Dr. Jacqualine Code, for evaluation and management of left flank pain. ? ?Chief Complaint: Left flank pain ? ?HPI:  ?Damon Blair is a 31 y.o. year old M with history of MS who presents with 1 week of left sided flank pain.  He originally presented to his PCP on 10/20/2021 with some abdominal pain and dark-colored urine, and urinalysis was completely benign at that time.  He had worsening pain and presented to the ER overnight, but left before being seen.  He returned this morning with ongoing left-sided flank pain.  He reports some subjective possible fevers at home.  CT today shows a 4 mm left distal ureteral stone with minimal hydronephrosis.  He denies any nausea or vomiting over the last few days.  He is having some mild urinary symptoms of weak stream and some occasional dysuria at the end of urination. He denies any prior stone episodes. No prior urologic surgeries.  ? ?Normal vital signs here with temperature of 99 degrees, normal blood pressure, heart rate 88. ? ?UA last night with 0 squamous cells, 0-5 RBCs ,>50 WBCs, large leukocytes, nitrite negative, no bacteria seen ? ?PMH: ?Past Medical History:  ?Diagnosis Date  ? MS (multiple sclerosis) ([place])   ? ? ?Surgical History: ?Past Surgical History:  ?Procedure Laterality Date  ? FRACTURE SURGERY Left   ? ? ? ?Allergies:  ?Allergies  ?Allergen Reactions  ? Augmentin [Amoxicillin-Pot Clavulanate] Rash  ? ? ?Family History: ?No family history on file. ? ?Social History:  reports that he has never smoked. He has never used smokeless tobacco. He reports that he does not currently use alcohol. He reports that he does not currently use drugs. ? ?ROS: ?Negative aside from those stated in the HPI. ? ?Physical Exam: ?BP 107/67   Pulse 82   Temp 98.7 ?F (37.1 ?C) (Oral)   Resp 18   Ht 5\' 9"  (1.753 m)   Wt 81.6 kg   SpO2 100%   BMI 26.57 kg/m?   ? ?Constitutional:  Alert and oriented, No acute  distress. ?Cardiovascular: RRR ?Respiratory: CTA bilaterally ?GI: Abdomen is soft, nontender, nondistended, no abdominal masses ?GU: L CVA tenderness ?Lymph: No cervical or inguinal lymphadenopathy. ?Skin: No rashes, bruises or suspicious lesions. ?Neurologic: Grossly intact, no focal deficits, moving all 4 extremities. ?Psychiatric: Normal mood and affect. ? ?Laboratory Data: ?Reviewed, see HPI ? ?Pertinent Imaging: ?I have personally reviewed the CT showing a 3-4 mm left distal ureteral stone with minimal hydronephrosis.  Additionally, there is some patchy attenuation in the base of the left lower lobe worrisome for possible aspiration versus early pneumonia. ? ?Assessment & Plan:   ?31 year old healthy male with history of MS who presents with 1 week of left-sided flank pain, as well as some subjective possible fevers at home.  CT here shows a 4 mm left distal ureteral stone with minimal hydronephrosis, urinalysis equivocal with no bacteria, but pyuria.  Afebrile here, mild leukocytosis to 14k, also has questionable nodularity left lower lobe consistent with possible aspiration versus pneumonia.  Clinically he appears well and is afebrile with normal vital signs at this time. ? ?We discussed options at length including proceeding to the OR for cystoscopy and ureteral stent placement versus attempted ureteroscopy if no definitive evidence of infection, versus trial of medical expulsive therapy and antibiotics for possible UTI versus pneumonia.  We reviewed the risks and benefits at length.  We discussed stent related  symptoms including urgency, frequency, dysuria, and left-sided flank pain, as well as possible need for staged procedure with stent placement and follow-up ureteroscopy in 2 to 3 weeks after treatment with antibiotics.  Rate of spontaneous passage with distal 101mm stone ~90%. We discussed the risks of discharge on medical expulsive therapy and antibiotics including fever,  infection/pyelonephritis/sepsis, but potential ability to avoid any surgical intervention and stent placement. ? ?Using shared decision making, he opted for outpatient management with Flomax, Bactrim DS twice daily for questionable urine, Toradol and narcotics for pain control, and close follow-up with urology next week to confirm stone passage.  We discussed return precautions extensively including high fever or uncontrolled pain.  I think he has a very good understanding of his situation and lab findings, and a trial of medical expulsive therapy and antibiotics is not unreasonable. ? ? ?[name], MD ? ?Total time spent on the floor was 60 minutes, with greater than 50% spent in counseling and coordination of care with the patient regarding left distal ureteral stone, equivocal UA, and options of medical expulsive therapy vs cystoscopy and ureteral stent placement or ureteroscopy and laser lithotripsy. ? ?[name] ?[address], [address] ?[place], [place] [address] ?[phone] ? ?

## 2021-10-27 NOTE — ED Triage Notes (Signed)
Pt comes into the ED via POV c/o left lower back pain and dysuria.  Pt was seen last night where we obtained blood and urine.  Pt left before seeing the physician.  Pt states the pain is still there.  Denies any h/o Kidney stone.  Pt states that he does have MS.  ?

## 2021-10-29 LAB — URINE CULTURE: Culture: >=100000 — AB

## 2021-10-30 ENCOUNTER — Telehealth: Payer: Self-pay | Admitting: Emergency Medicine

## 2021-10-30 NOTE — Progress Notes (Signed)
ED Antimicrobial Stewardship Positive Culture Follow Up  ? ?Pieter Fooks is an 31 y.o. male who presented to [facility] on 10/27/2021 with a chief complaint of dysuria. PMH MS. Found to have stone on CT with hydronephrosis. ?Chief Complaint  ?Patient presents with  ? Dysuria  ?   ?  ? Back Pain  ? ? ?Recent Results (from the past 720 hour(s))  ?Urine Culture     Status: Abnormal  ? Collection Time: 10/26/21  9:17 PM  ? Specimen: Urine, Clean Catch  ?Result Value Ref Range Status  ? Specimen Description   Final  ?  URINE, CLEAN CATCH ?Performed at [facility], [address] [place], [place] [address] ?  ? Special Requests   Final  ?  NONE ?Performed at [facility], [address], [place], [place] [address] ?  ? Culture >=100,000 COLONIES/mL ESCHERICHIA COLI (A)  Final  ? Report Status 10/29/2021 FINAL  Final  ? Organism ID, Bacteria ESCHERICHIA COLI (A)  Final  ?    Susceptibility  ? Escherichia coli - MIC*  ?  AMPICILLIN >=32 RESISTANT Resistant   ?  CEFAZOLIN <=4 SENSITIVE Sensitive   ?  CEFEPIME <=0.12 SENSITIVE Sensitive   ?  CEFTRIAXONE <=0.25 SENSITIVE Sensitive   ?  CIPROFLOXACIN <=0.25 SENSITIVE Sensitive   ?  GENTAMICIN <=1 SENSITIVE Sensitive   ?  IMIPENEM <=0.25 SENSITIVE Sensitive   ?  NITROFURANTOIN <=16 SENSITIVE Sensitive   ?  TRIMETH/SULFA <=20 SENSITIVE Sensitive   ?  AMPICILLIN/SULBACTAM >=32 RESISTANT Resistant   ?  PIP/TAZO <=4 SENSITIVE Sensitive   ?  * >=100,000 COLONIES/mL ESCHERICHIA COLI  ?Blood culture (routine x 2)     Status: None (Preliminary result)  ? Collection Time: 10/27/21  8:42 AM  ? Specimen: BLOOD  ?Result Value Ref Range Status  ? Specimen Description BLOOD LEFT AC  Final  ? Special Requests   Final  ?  BOTTLES DRAWN AEROBIC AND ANAEROBIC Blood Culture adequate volume  ? Culture   Final  ?  NO GROWTH 3 DAYS ?Performed at [facility], [address], [place], [place] [address] ?  ? Report Status PENDING  Incomplete  ?Blood culture (routine  x 2)     Status: None (Preliminary result)  ? Collection Time: 10/27/21  8:42 AM  ? Specimen: BLOOD LEFT HAND  ?Result Value Ref Range Status  ? Specimen Description BLOOD LEFT HAND  Final  ? Special Requests   Final  ?  BOTTLES DRAWN AEROBIC AND ANAEROBIC Blood Culture results may not be optimal due to an inadequate volume of blood received in culture bottles  ? Culture   Final  ?  NO GROWTH 3 DAYS ?Performed at [facility], [address] [place], [place] [address] ?  ? Report Status PENDING  Incomplete  ?Resp Panel by RT-PCR (Flu A&B, Covid) Nasopharyngeal Swab     Status: None  ? Collection Time: 10/27/21 10:32 AM  ? Specimen: Nasopharyngeal Swab; Nasopharyngeal(NP) swabs in vial transport medium  ?Result Value Ref Range Status  ? SARS Coronavirus 2 by RT PCR NEGATIVE NEGATIVE Final  ?  Comment: (NOTE) ?SARS-CoV-2 target nucleic acids are NOT DETECTED. ? ?The SARS-CoV-2 RNA is generally detectable in upper respiratory ?specimens during the acute phase of infection. The lowest ?concentration of SARS-CoV-2 viral copies this assay can detect is ?138 copies/mL. A negative result does not preclude SARS-Cov-2 ?infection and should not be used as the sole basis for treatment or ?other  patient management decisions. A negative result may occur with  ?improper specimen collection/handling, submission of specimen other ?than nasopharyngeal swab, presence of viral mutation(s) within the ?areas targeted by this assay, and inadequate number of viral ?copies(<138 copies/mL). A negative result must be combined with ?clinical observations, patient history, and epidemiological ?information. The expected result is Negative. ? ?Fact Sheet for Patients:  ?BloggerCourse.com ? ?Fact Sheet for Healthcare Providers:  ?SeriousBroker.it ? ?This test is no t yet approved or cleared by the [place] FDA and  ?has been authorized for detection and/or diagnosis of SARS-CoV-2  by ?FDA under an Emergency Use Authorization (EUA). This EUA will remain  ?in effect (meaning this test can be used) for the duration of the ?COVID-19 declaration under Section 564(b)(1) of the Act, 21 ?U.S.C.section 360bbb-3(b)(1), unless the authorization is terminated  ?or revoked sooner.  ? ? ?  ? Influenza A by PCR NEGATIVE NEGATIVE Final  ? Influenza B by PCR NEGATIVE NEGATIVE Final  ?  Comment: (NOTE) ?The Xpert Xpress SARS-CoV-2/FLU/RSV plus assay is intended as an aid ?in the diagnosis of influenza from Nasopharyngeal swab specimens and ?should not be used as a sole basis for treatment. Nasal washings and ?aspirates are unacceptable for Xpert Xpress SARS-CoV-2/FLU/RSV ?testing. ? ?Fact Sheet for Patients: ?BloggerCourse.com ? ?Fact Sheet for Healthcare Providers: ?SeriousBroker.it ? ?This test is not yet approved or cleared by the [place] FDA and ?has been authorized for detection and/or diagnosis of SARS-CoV-2 by ?FDA under an Emergency Use Authorization (EUA). This EUA will remain ?in effect (meaning this test can be used) for the duration of the ?COVID-19 declaration under Section 564(b)(1) of the Act, 21 U.S.C. ?section 360bbb-3(b)(1), unless the authorization is terminated or ?revoked. ? ?Performed at [facility], [address] [place], ?[place] [address] ?  ? ? ?[x]  Treated with amoxicillin, organism resistant to prescribed antimicrobial ?[]  Patient discharged originally without antimicrobial agent and treatment is now indicated ? ?New antibiotic prescription: cephalexin 500 mg BID x 7 days ? ?Patient plans to attend urology follow up visit on Tuesday 4/18. Called in prescription to [facility]. Patient informed by Dr. via telephone visit. ? ?ED Provider: Dr. [id number] ? ?Fanny Bien, PharmD ?Pharmacy Resident  ?10/30/2021 ?1:55 PM ?Monday - Friday phone -  [phone] ?Saturday - Sunday phone - [phone] ? ?

## 2021-10-30 NOTE — Telephone Encounter (Signed)
Please allow patient out of work until Monday, April 17. ?

## 2021-10-31 ENCOUNTER — Other Ambulatory Visit: Payer: Self-pay

## 2021-10-31 DIAGNOSIS — N200 Calculus of kidney: Secondary | ICD-10-CM

## 2021-11-01 ENCOUNTER — Ambulatory Visit (INDEPENDENT_AMBULATORY_CARE_PROVIDER_SITE_OTHER): Payer: Managed Care, Other (non HMO) | Admitting: Urology

## 2021-11-01 ENCOUNTER — Other Ambulatory Visit: Payer: Managed Care, Other (non HMO)

## 2021-11-01 ENCOUNTER — Encounter: Payer: Self-pay | Admitting: Urology

## 2021-11-01 VITALS — BP 143/77 | HR 91 | Ht 69.0 in | Wt 192.0 lb

## 2021-11-01 DIAGNOSIS — N200 Calculus of kidney: Secondary | ICD-10-CM | POA: Diagnosis not present

## 2021-11-01 DIAGNOSIS — N3000 Acute cystitis without hematuria: Secondary | ICD-10-CM

## 2021-11-01 LAB — CULTURE, BLOOD (ROUTINE X 2)
Culture: NO GROWTH
Culture: NO GROWTH
Special Requests: ADEQUATE

## 2021-11-01 NOTE — Progress Notes (Signed)
? ?  11/01/2021 ?2:57 PM  ? ?Damon Blair ?Aug 03, 1990 ?[id number] ? ?Reason for visit: Follow up left ureteral stone, UTI ? ?HPI: ?I saw [name] back for follow-up of the above issues.  I saw him as a consult in the ER on 10/27/2021 when he was having some left-sided flank pain and urinary symptoms.  UA showed pyuria but no bacteria, and we had discussed a trial of antibiotics and medical expulsive therapy versus stent placement and he opted for antibiotics and medical expulsive therapy after an extensive conversation of risks and benefits.  He was discharged on amoxicillin based on a possible pneumonia seen on CT. ? ?He reports he feels significantly improved from hospitalization.  His antibiotics were changed to Keflex based on resistant E. coli on urine culture, and blood cultures were negative.  He denies any fevers.  He still has some occasional left-sided flank pain, but urinary symptoms have improved. ? ?I personally viewed and interpreted the CT scan showing a 3 mm left distal ureteral stone with mild hydronephrosis, as well as the ER notes and final urine culture results. ? ?We again discussed options including ureteroscopy and laser lithotripsy, temporary stent placement with delayed management, or continuing medical expulsive therapy.  He clinically appears well and would like to continue with medical expulsive therapy, which I think is reasonable.  Return precautions discussed extensively including uncontrolled left-sided flank pain or fevers. ? ?-RTC 1 week UA and KUB, if persistent stone at that time likely pursue left ureteroscopy, laser lithotripsy, stent placement ?-Finish course of Keflex, continue Flomax ? ? ?Sondra Come, MD ? ?[facility] ?[address], [address] ?[place], [place] [address] ?[phone] ? ?

## 2021-11-01 NOTE — Patient Instructions (Signed)
Laser Therapy for Kidney Stones ?Laser therapy for kidney stones is a procedure to break up small, hard mineral deposits that form in the kidney (kidney stones). The procedure is done using a device that produces a focused beam of light (laser). The laser breaks up kidney stones into pieces that are small enough to be passed out of the body through urination or removed from the body during the procedure. You may need laser therapy if you have kidney stones that are painful or block your urinary tract. ?This procedure is done by inserting a tube (ureteroscope) into your kidney through the urethral opening. The urethra is the part of the body that drains urine from the bladder. In women, the urethra opens above the vaginal opening. In men, the urethra opens at the tip of the penis. The ureteroscope is inserted through the urethra, and surgical instruments are moved through the bladder and the muscular tube that connects the kidney to the bladder (ureter) until they reach the kidney. ?Tell a health care provider about: ?Any allergies you have. ?All medicines you are taking, including vitamins, herbs, eye drops, creams, and over-the-counter medicines. ?Any problems you or family members have had with anesthetic medicines. ?Any blood disorders you have. ?Any surgeries you have had. ?Any medical conditions you have. ?Whether you are pregnant or may be pregnant. ?What are the risks? ?Generally, this is a safe procedure. However, problems may occur, including: ?Infection. ?Bleeding. ?Allergic reactions to medicines. ?Damage to the urethra, bladder, or ureter. ?Urinary tract infection (UTI). ?Narrowing of the urethra (urethral stricture). ?Difficulty passing urine. ?Blockage of the kidney caused by a fragment of kidney stone. ?What happens before the procedure? ?Medicines ?Ask your health care provider about: ?Changing or stopping your regular medicines. This is especially important if you are taking diabetes medicines or  blood thinners. ?Taking medicines such as aspirin and ibuprofen. These medicines can thin your blood. Do not take these medicines unless your health care provider tells you to take them. ?Taking over-the-counter medicines, vitamins, herbs, and supplements. ?Eating and drinking ?Follow instructions from your health care provider about eating and drinking, which may include: ?8 hours before the procedure - stop eating heavy meals or foods, such as meat, fried foods, or fatty foods. ?6 hours before the procedure - stop eating light meals or foods, such as toast or cereal. ?6 hours before the procedure - stop drinking milk or drinks that contain milk. ?2 hours before the procedure - stop drinking clear liquids. ?Staying hydrated ?Follow instructions from your health care provider about hydration, which may include: ?Up to 2 hours before the procedure - you may continue to drink clear liquids, such as water, clear fruit juice, black coffee, and plain tea. ? ?General instructions ?You may have a physical exam before the procedure. You may also have tests, such as imaging tests and blood or urine tests. ?If your ureter is too narrow, your health care provider may place a soft, flexible tube (stent) inside of it. The stent may be placed days or weeks before your laser therapy procedure. ?Plan to have someone take you home from the hospital or clinic. ?If you will be going home right after the procedure, plan to have someone stay with you for 24 hours. ?Do not use any products that contain nicotine or tobacco for at least 4 weeks before the procedure. These products include cigarettes, e-cigarettes, and chewing tobacco. If you need help quitting, ask your health care provider. ?Ask your health care provider: ?How your   surgical site will be marked or identified. ?What steps will be taken to help prevent infection. These may include: ?Removing hair at the surgery site. ?Washing skin with a germ-killing soap. ?Taking antibiotic  medicine. ?What happens during the procedure? ? ?An IV will be inserted into one of your veins. ?You will be given one or more of the following: ?A medicine to help you relax (sedative). ?A medicine to numb the area (local anesthetic). ?A medicine to make you fall asleep (general anesthetic). ?A ureteroscope will be inserted into your urethra. The ureteroscope will send images to a video screen in the operating room to guide your surgeon to the area of your kidney that will be treated. ?A small, flexible tube will be threaded through the ureteroscope and into your bladder and ureter, up to your kidney. ?The laser device will be inserted into your kidney through the tube. Your surgeon will pulse the laser on and off to break up kidney stones. ?A surgical instrument that has a tiny wire basket may be inserted through the tube into your kidney to remove the pieces of broken kidney stone. ?The procedure may vary among health care providers and hospitals. ?What happens after the procedure? ?Your blood pressure, heart rate, breathing rate, and blood oxygen level will be monitored until you leave the hospital or clinic. ?You will be given pain medicine as needed. ?You may continue to receive antibiotics. ?You may have a stent temporarily placed in your ureter. ?Do not drive for 24 hours if you were given a sedative during your procedure. ?You may be given a strainer to collect any stone fragments that you pass in your urine. Your health care provider may have these tested. ?Summary ?Laser therapy for kidney stones is a procedure to break up kidney stones into pieces that are small enough to be passed out of the body through urination or removed during the procedure. ?Follow instructions from your health care provider about eating and drinking before the procedure. ?During the procedure, the ureteroscope will send images to a video screen to guide your surgeon to the area of your kidney that will be treated. ?Do not drive  for 24 hours if you were given a sedative during your procedure. ?This information is not intended to replace advice given to you by your health care provider. Make sure you discuss any questions you have with your health care provider. ?Document Revised: 03/07/2021 Document Reviewed: 03/07/2021 ?Elsevier Patient Education ? 2023 Elsevier Inc. ? ?Ureteral Stent Implantation ? ?Ureteral stent implantation is a procedure to insert (implant) a flexible, soft, plastic tube (stent) into a ureter. Ureters are the tube-like parts of the body that drain urine from the kidneys. The stent supports the ureter while it heals and helps to drain urine. You may have a ureteral stent implanted after having a procedure to remove a blockage from the ureter (ureterolysis or pyeloplasty). You may also have a stent implanted to open the flow of urine when you have a blockage caused by a kidney stone, tumor, blood clot, or infection. ?You have two ureters, one on each side of the body. The ureters connect the kidneys to the organ that holds urine until it passes out of the body (bladder). The stent is placed so that one end is in the kidney, and one end is in the bladder. The stent is usually taken out after your ureter has healed. Depending on your condition, you may have a stent for just a few weeks, or you may  have a long-term stent that will need to be replaced every few months. ?Tell a health care provider about: ?Any allergies you have. ?All medicines you are taking, including vitamins, herbs, eye drops, creams, and over-the-counter medicines. ?Any problems you or family members have had with anesthetic medicines. ?Any blood disorders you have. ?Any surgeries you have had. ?Any medical conditions you have. ?Whether you are pregnant or may be pregnant. ?What are the risks? ?Generally, this is a safe procedure. However, problems may occur, including: ?Infection. ?Bleeding. ?Allergic reactions to medicines. ?Damage to other structures  or organs. Tearing (perforation) of the ureter is possible. ?Movement of the stent away from where it is placed during surgery (migration). ?What happens before the procedure? ?Medicines ?Ask your heal

## 2021-11-09 ENCOUNTER — Encounter: Payer: Self-pay | Admitting: Urology

## 2021-11-09 ENCOUNTER — Ambulatory Visit (INDEPENDENT_AMBULATORY_CARE_PROVIDER_SITE_OTHER): Payer: Managed Care, Other (non HMO) | Admitting: Urology

## 2021-11-09 ENCOUNTER — Ambulatory Visit
Admission: RE | Admit: 2021-11-09 | Discharge: 2021-11-09 | Disposition: A | Discharge status: Home / Self Care | Payer: Managed Care, Other (non HMO) | Source: Ambulatory Visit | Attending: Urology | Admitting: Urology

## 2021-11-09 VITALS — BP 143/85 | HR 80 | Ht 69.0 in | Wt 190.0 lb

## 2021-11-09 DIAGNOSIS — N200 Calculus of kidney: Secondary | ICD-10-CM | POA: Diagnosis present

## 2021-11-09 LAB — URINALYSIS, COMPLETE
Bilirubin, UA: NEGATIVE
Glucose, UA: NEGATIVE
Ketones, UA: NEGATIVE
Leukocytes,UA: NEGATIVE
Nitrite, UA: NEGATIVE
Protein,UA: NEGATIVE
RBC, UA: NEGATIVE
Specific Gravity, UA: 1.02 (ref 1.005–1.030)
Urobilinogen, Ur: 0.2 mg/dL (ref 0.2–1.0)
pH, UA: 6 (ref 5.0–7.5)

## 2021-11-09 LAB — MICROSCOPIC EXAMINATION: Bacteria, UA: NONE SEEN

## 2021-11-09 IMAGING — CR DG ABDOMEN 1V
1 series · 2 of 2 positions shown · non-contrast
Comparison: [DATE]

CLINICAL DATA: History of left nephrolithiasis

EXAM:
ABDOMEN - 1 VIEW

[Series 1: dg abd 1 view · 0.14mm/px · 2 of 2 slices shown]
[im 1/2]
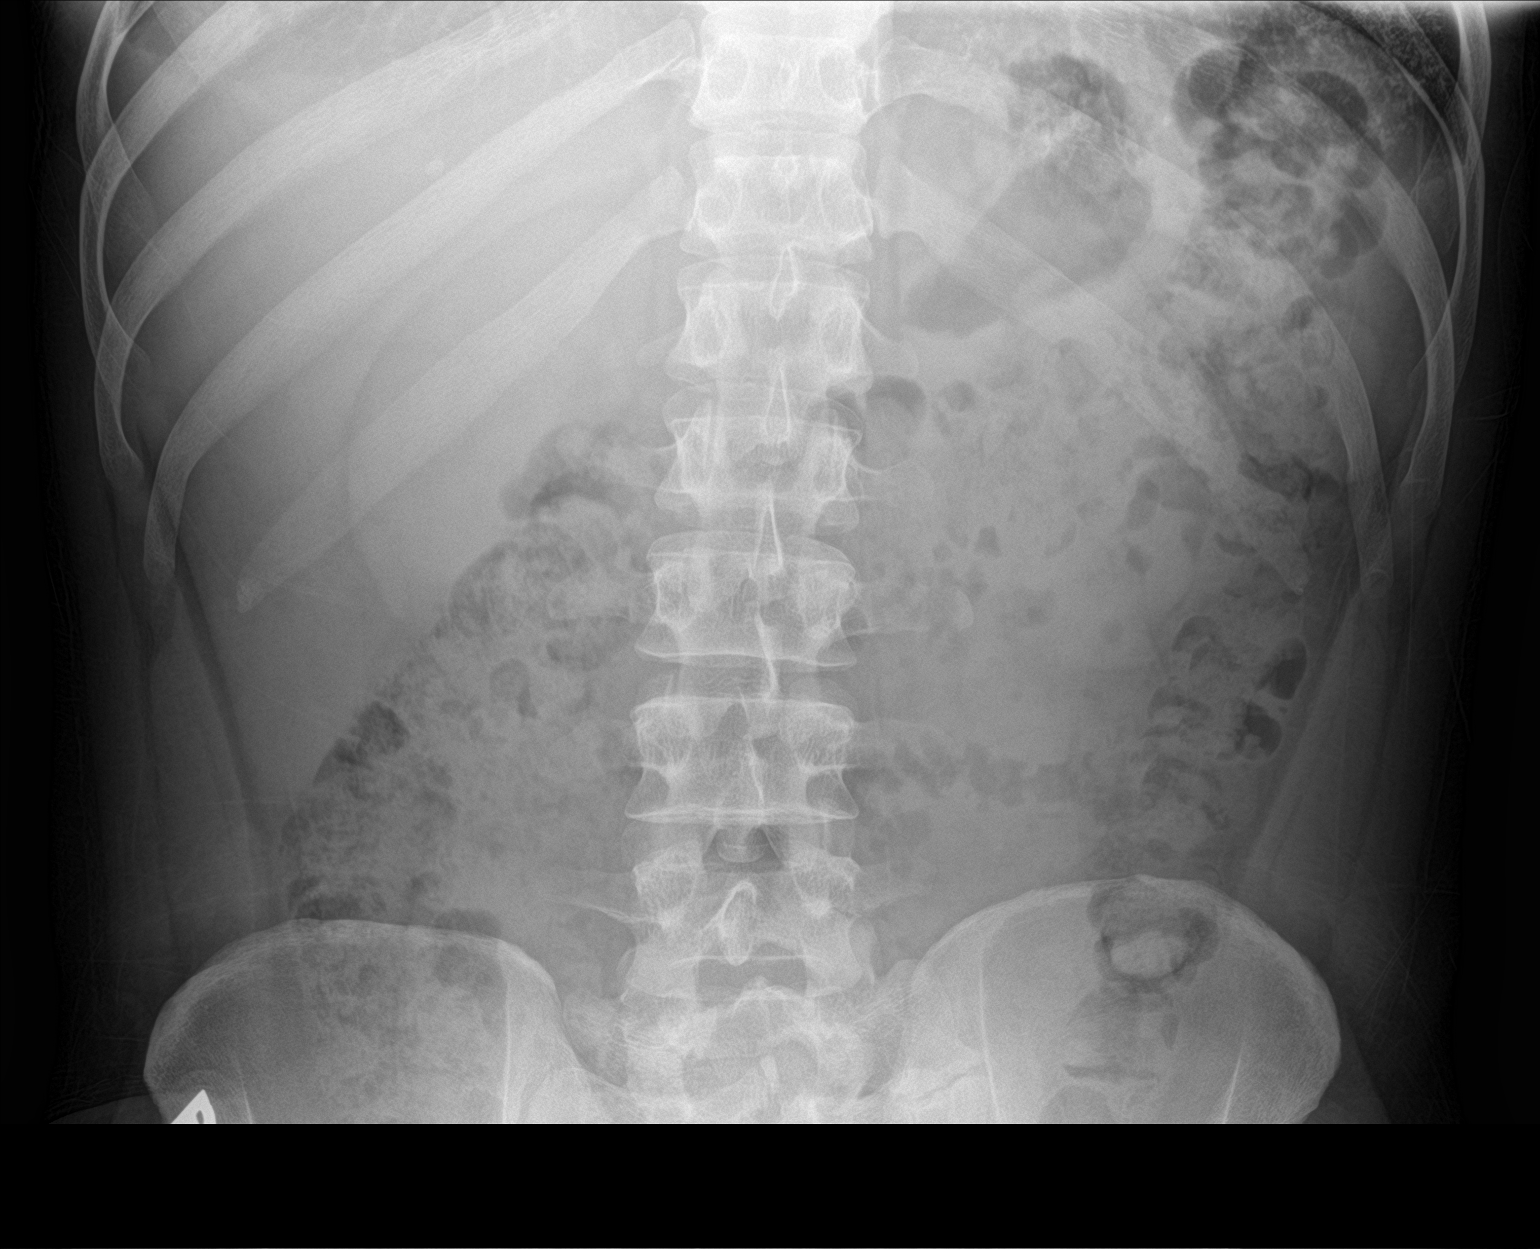
[im 2/2]
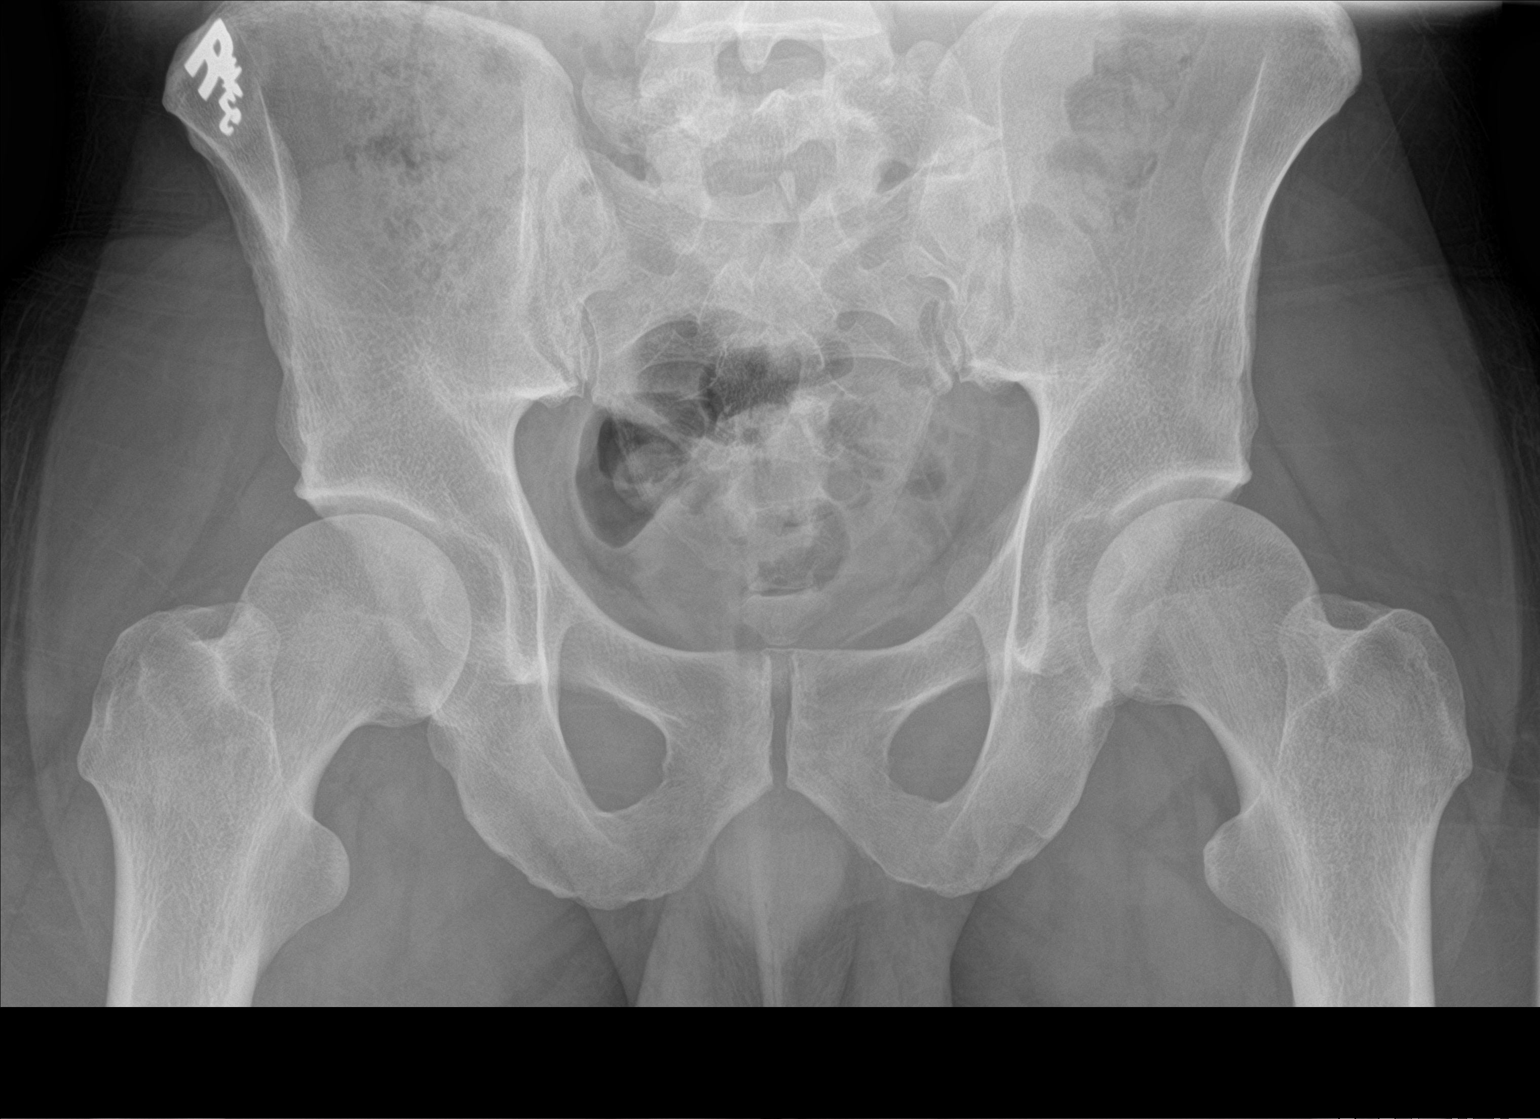

[2 of 2 positions shown; findings below may reference images not displayed]

FINDINGS: Renal shadows are obscured by stool and air pattern. No large
radiopaque urinary tract calculi appreciated. The previously
described 4 mm left distal ureteral calculus close to the UVJ is not
appreciated by plain radiography.

No osseous abnormality.  Nonobstructive bowel gas pattern.
IMPRESSION: No significant finding by plain radiography

## 2021-11-09 NOTE — Patient Instructions (Signed)
Dietary Guidelines to Help Prevent Kidney Stones Kidney stones are deposits of minerals and salts that form inside your kidneys. Your risk of developing kidney stones may be greater depending on your diet, your lifestyle, the medicines you take, and whether you have certain medical conditions. Most people can lower their chances of developing kidney stones by following the instructions below. Your dietitian may give you more specific instructions depending on your overall health and the type of kidney stones you tend to develop. What are tips for following this plan? Reading food labels  Choose foods with "no salt added" or "low-salt" labels. Limit your salt (sodium) intake to less than 1,500 mg a day. Choose foods with calcium for each meal and snack. Try to eat about 300 mg of calcium at each meal. Foods that contain 200-500 mg of calcium a serving include: 8 oz (237 mL) of milk, calcium-fortifiednon-dairy milk, and calcium-fortifiedfruit juice. Calcium-fortified means that calcium has been added to these drinks. 8 oz (237 mL) of kefir, yogurt, and soy yogurt. 4 oz (114 g) of tofu. 1 oz (28 g) of cheese. 1 cup (150 g) of dried figs. 1 cup (91 g) of cooked broccoli. One 3 oz (85 g) can of sardines or mackerel. Most people need 1,000-1,500 mg of calcium a day. Talk to your dietitian about how much calcium is recommended for you. Shopping Buy plenty of fresh fruits and vegetables. Most people do not need to avoid fruits and vegetables, even if these foods contain nutrients that may contribute to kidney stones. When shopping for convenience foods, choose: Whole pieces of fruit. Pre-made salads with dressing on the side. Low-fat fruit and yogurt smoothies. Avoid buying frozen meals or prepared deli foods. These can be high in sodium. Look for foods with live cultures, such as yogurt and kefir. Choose high-fiber grains, such as whole-wheat breads, oat bran, and wheat cereals. Cooking Do not add  salt to food when cooking. Place a salt shaker on the table and allow each person to add his or her own salt to taste. Use vegetable protein, such as beans, textured vegetable protein (TVP), or tofu, instead of meat in pasta, casseroles, and soups. Meal planning Eat less salt, if told by your dietitian. To do this: Avoid eating processed or pre-made food. Avoid eating fast food. Eat less animal protein, including cheese, meat, poultry, or fish, if told by your dietitian. To do this: Limit the number of times you have meat, poultry, fish, or cheese each week. Eat a diet free of meat at least 2 days a week. Eat only one serving each day of meat, poultry, fish, or seafood. When you prepare animal protein, cut pieces into small portion sizes. For most meat and fish, one serving is about the size of the palm of your hand. Eat at least five servings of fresh fruits and vegetables each day. To do this: Keep fruits and vegetables on hand for snacks. Eat one piece of fruit or a handful of berries with breakfast. Have a salad and fruit at lunch. Have two kinds of vegetables at dinner. Limit foods that are high in a substance called oxalate. These include: Spinach (cooked), rhubarb, beets, sweet potatoes, and Swiss chard. Peanuts. Potato chips, french fries, and baked potatoes with skin on. Nuts and nut products. Chocolate. If you regularly take a diuretic medicine, make sure to eat at least 1 or 2 servings of fruits or vegetables that are high in potassium each day. These include: Avocado. Banana. Orange, prune,   carrot, or tomato juice. Baked potato. Cabbage. Beans and split peas. Lifestyle  Drink enough fluid to keep your urine pale yellow. This is the most important thing you can do. Spread your fluid intake throughout the day. If you drink alcohol: Limit how much you use to: 0-1 drink a day for women who are not pregnant. 0-2 drinks a day for men. Be aware of how much alcohol is in your  drink. In the [place] one drink equals one 12 oz bottle of beer (355 mL), one 5 oz glass of wine (148 mL), or one 1 oz glass of hard liquor (44 mL). Lose weight if told by your health care provider. Work with your dietitian to find an eating plan and weight loss strategies that work best for you. General information Talk to your health care provider and dietitian about taking daily supplements. You may be told the following depending on your health and the cause of your kidney stones: Not to take supplements with vitamin C. To take a calcium supplement. To take a daily probiotic supplement. To take other supplements such as magnesium, fish oil, or vitamin B6. Take over-the-counter and prescription medicines only as told by your health care provider. These include supplements. What foods should I limit? Limit your intake of the following foods, or eat them as told by your dietitian. Vegetables Spinach. Rhubarb. Beets. Canned vegetables. Pickles. Olives. Baked potatoes with skin. Grains Wheat bran. Baked goods. Salted crackers. Cereals high in sugar. Meats and other proteins Nuts. Nut butters. Large portions of meat, poultry, or fish. Salted, precooked, or cured meats, such as sausages, meat loaves, and hot dogs. Dairy Cheese. Beverages Regular soft drinks. Regular vegetable juice. Seasonings and condiments Seasoning blends with salt. Salad dressings. Soy sauce. Ketchup. Barbecue sauce. Other foods Canned soups. Canned pasta sauce. Casseroles. Pizza. Lasagna. Frozen meals. Potato chips. French fries. The items listed above may not be a complete list of foods and beverages you should limit. Contact a dietitian for more information. What foods should I avoid? Talk to your dietitian about specific foods you should avoid based on the type of kidney stones you have and your overall health. Fruits Grapefruit. The item listed above may not be a complete list of foods and beverages you should  avoid. Contact a dietitian for more information. Summary Kidney stones are deposits of minerals and salts that form inside your kidneys. You can lower your risk of kidney stones by making changes to your diet. The most important thing you can do is drink enough fluid. Drink enough fluid to keep your urine pale yellow. Talk to your dietitian about how much calcium you should have each day, and eat less salt and animal protein as told by your dietitian. This information is not intended to replace advice given to you by your health care provider. Make sure you discuss any questions you have with your health care provider. Document Revised: 03/14/2021 Document Reviewed: 03/14/2021 Elsevier Patient Education  2023 Elsevier Inc.  

## 2021-11-09 NOTE — Progress Notes (Signed)
? ?  11/09/2021 ?11:14 AM  ? ?Damon Blair ?11/15/1990 ?[id number] ? ?Reason for visit: Follow up ureteral stone ? ?HPI: ?Healthy 31 year old male with history of MS who originally presented to the ER on 10/27/2021 with some left-sided flank pain, CT showed a 3 mm left distal ureteral stone, and he was discharged with medical expulsive therapy.  His urine culture ultimately grew E. coli, and he was treated with culture appropriate Keflex. ? ?He reports resolution of his left-sided flank pain since our last visit, and denies any urinary complaints or fevers.  He denies any gross hematuria or dysuria.  Urinalysis today is completely benign.  I personally viewed and interpreted his KUB today that shows no evidence of persistent left distal ureteral stone.  We discussed the limitations of KUB, but with his resolution of symptoms, benign urinalysis, normal KUB, suspect he passed his stone.  Return precautions were discussed. ? ?We discussed general stone prevention strategies including adequate hydration with goal of producing 2.5 L of urine daily, increasing citric acid intake, increasing calcium intake during high oxalate meals, minimizing animal protein, and decreasing salt intake. Information about dietary recommendations given today.  ? ?Follow-up with urology as needed ? ? ?[name], MD ? ?[facility] ?[address], [address] ?[place], [place] [address] ?[phone] ? ?

## 2021-11-15 ENCOUNTER — Other Ambulatory Visit: Payer: Self-pay

## 2021-11-15 ENCOUNTER — Ambulatory Visit: Payer: Managed Care, Other (non HMO) | Admitting: Urology

## 2021-11-15 ENCOUNTER — Telehealth: Payer: Self-pay | Admitting: *Deleted

## 2021-11-15 ENCOUNTER — Other Ambulatory Visit
Admission: RE | Admit: 2021-11-15 | Discharge: 2021-11-15 | Disposition: A | Discharge status: Home / Self Care | Payer: Managed Care, Other (non HMO) | Source: Home / Self Care | Attending: Urology | Admitting: Urology

## 2021-11-15 ENCOUNTER — Emergency Department: Payer: Managed Care, Other (non HMO)

## 2021-11-15 ENCOUNTER — Encounter: Payer: Self-pay | Admitting: Emergency Medicine

## 2021-11-15 ENCOUNTER — Observation Stay
Admission: EM | Admit: 2021-11-15 | Discharge: 2021-11-16 | Disposition: A | Discharge status: Home / Self Care | Payer: Managed Care, Other (non HMO) | Attending: Internal Medicine | Admitting: Internal Medicine

## 2021-11-15 DIAGNOSIS — G35 Multiple sclerosis: Secondary | ICD-10-CM

## 2021-11-15 DIAGNOSIS — R109 Unspecified abdominal pain: Secondary | ICD-10-CM | POA: Diagnosis present

## 2021-11-15 DIAGNOSIS — N10 Acute tubulo-interstitial nephritis: Secondary | ICD-10-CM | POA: Diagnosis present

## 2021-11-15 DIAGNOSIS — Z79899 Other long term (current) drug therapy: Secondary | ICD-10-CM | POA: Diagnosis not present

## 2021-11-15 DIAGNOSIS — N132 Hydronephrosis with renal and ureteral calculous obstruction: Principal | ICD-10-CM

## 2021-11-15 DIAGNOSIS — N12 Tubulo-interstitial nephritis, not specified as acute or chronic: Secondary | ICD-10-CM

## 2021-11-15 DIAGNOSIS — R300 Dysuria: Secondary | ICD-10-CM | POA: Insufficient documentation

## 2021-11-15 DIAGNOSIS — N200 Calculus of kidney: Secondary | ICD-10-CM

## 2021-11-15 HISTORY — DX: Tubulo-interstitial nephritis, not specified as acute or chronic: N12

## 2021-11-15 HISTORY — DX: Hydronephrosis with renal and ureteral calculous obstruction: N13.2

## 2021-11-15 HISTORY — DX: Acute pyelonephritis: N10

## 2021-11-15 LAB — COMPREHENSIVE METABOLIC PANEL
ALT: 16 U/L (ref 0–44)
AST: 18 U/L (ref 15–41)
Albumin: 4.2 g/dL (ref 3.5–5.0)
Alkaline Phosphatase: 49 U/L (ref 38–126)
Anion gap: 11 (ref 5–15)
BUN: 7 mg/dL (ref 6–20)
CO2: 27 mmol/L (ref 22–32)
Calcium: 9.7 mg/dL (ref 8.9–10.3)
Chloride: 100 mmol/L (ref 98–111)
Creatinine, Ser: 0.85 mg/dL (ref 0.61–1.24)
GFR, Estimated: >60 mL/min (ref >60)
Glucose, Bld: 108 mg/dL — ABNORMAL HIGH (ref 70–99)
Potassium: 4 mmol/L (ref 3.5–5.1)
Sodium: 138 mmol/L (ref 135–145)
Total Bilirubin: 0.9 mg/dL (ref 0.3–1.2)
Total Protein: 7.4 g/dL (ref 6.5–8.1)

## 2021-11-15 LAB — CBC WITH DIFFERENTIAL/PLATELET
Abs Immature Granulocytes: 0.02 10*3/uL (ref 0.00–0.07)
Basophils Absolute: 0.1 10*3/uL (ref 0.0–0.1)
Basophils Relative: 1 %
Eosinophils Absolute: 0.3 10*3/uL (ref 0.0–0.5)
Eosinophils Relative: 4 %
HCT: 36.5 % — ABNORMAL LOW (ref 39.0–52.0)
Hemoglobin: 12.2 g/dL — ABNORMAL LOW (ref 13.0–17.0)
Immature Granulocytes: 0 %
Lymphocytes Relative: 21 %
Lymphs Abs: 1.8 10*3/uL (ref 0.7–4.0)
MCH: 27.9 pg (ref 26.0–34.0)
MCHC: 33.4 g/dL (ref 30.0–36.0)
MCV: 83.5 fL (ref 80.0–100.0)
Monocytes Absolute: 0.8 10*3/uL (ref 0.1–1.0)
Monocytes Relative: 9 %
Neutro Abs: 5.6 10*3/uL (ref 1.7–7.7)
Neutrophils Relative %: 65 %
Platelets: 188 10*3/uL (ref 150–400)
RBC: 4.37 MIL/uL (ref 4.22–5.81)
RDW: 12.2 % (ref 11.5–15.5)
WBC: 8.6 10*3/uL (ref 4.0–10.5)
nRBC: 0 % (ref 0.0–0.2)

## 2021-11-15 LAB — URINALYSIS, ROUTINE W REFLEX MICROSCOPIC
Bilirubin Urine: NEGATIVE
Glucose, UA: NEGATIVE mg/dL
Ketones, ur: NEGATIVE mg/dL
Nitrite: NEGATIVE
Protein, ur: 100 mg/dL — AB
Specific Gravity, Urine: 1.004 — ABNORMAL LOW (ref 1.005–1.030)
pH: 6 (ref 5.0–8.0)

## 2021-11-15 LAB — URINALYSIS, COMPLETE (UACMP) WITH MICROSCOPIC
Bilirubin Urine: NEGATIVE
Glucose, UA: NEGATIVE mg/dL
Ketones, ur: NEGATIVE mg/dL
Nitrite: NEGATIVE
Specific Gravity, Urine: 1.01 (ref 1.005–1.030)
pH: 6.5 (ref 5.0–8.0)

## 2021-11-15 LAB — CHLAMYDIA/NGC RT PCR (ARMC ONLY)
Chlamydia Tr: NOT DETECTED
N gonorrhoeae: NOT DETECTED

## 2021-11-15 LAB — LIPASE, BLOOD: Lipase: 33 U/L (ref 11–51)

## 2021-11-15 LAB — LACTIC ACID, PLASMA: Lactic Acid, Venous: 0.7 mmol/L (ref 0.5–1.9)

## 2021-11-15 LAB — PROCALCITONIN: Procalcitonin: <0.1 ng/mL

## 2021-11-15 IMAGING — CT CT RENAL STONE PROTOCOL
2 of 4 series · 16 of 46 positions shown, 18 images · non-contrast
Comparison: CT [DATE]

CLINICAL DATA: Flank pain dysuria



[Series 2: ap without · axial · non-contrast · 0.76mm/px · z∈[-1027,-632]mm · 13 of 91 slices shown, 15 images]
[im 6/91  soft-tissue]
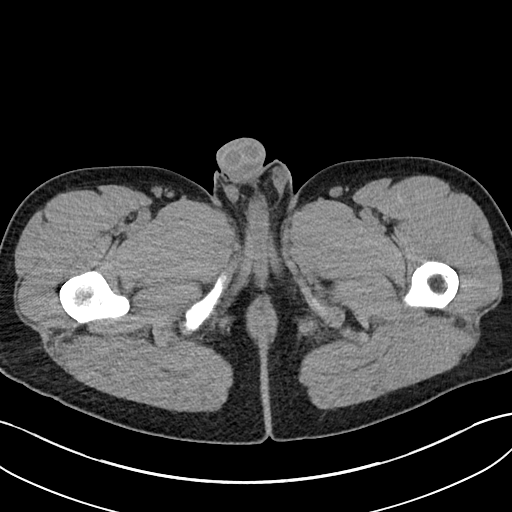
[im 6/91  bone]
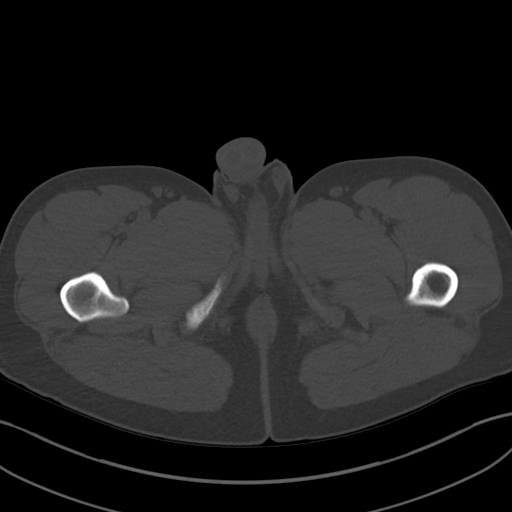
[im 11/91  soft-tissue]
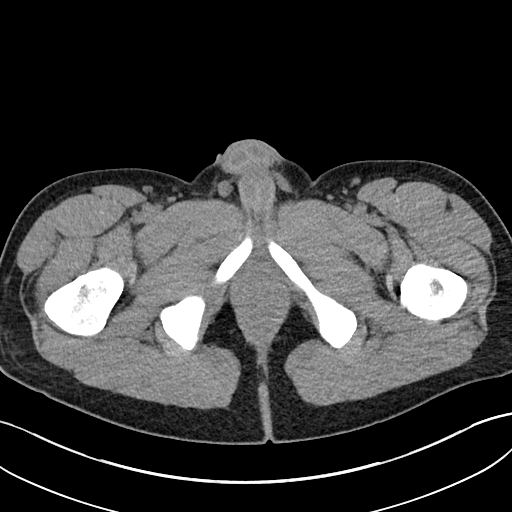
[im 22/91  soft-tissue]
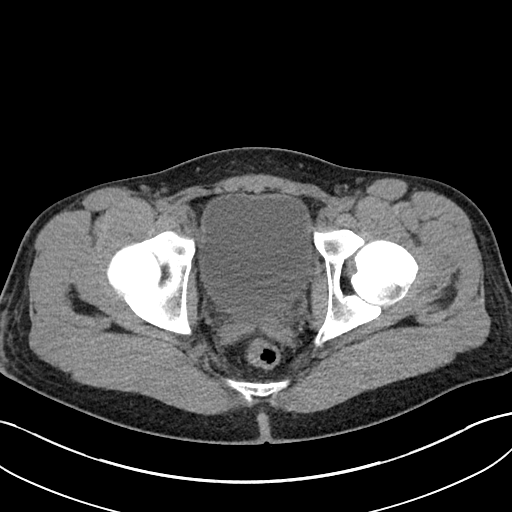
[im 27/91  soft-tissue]
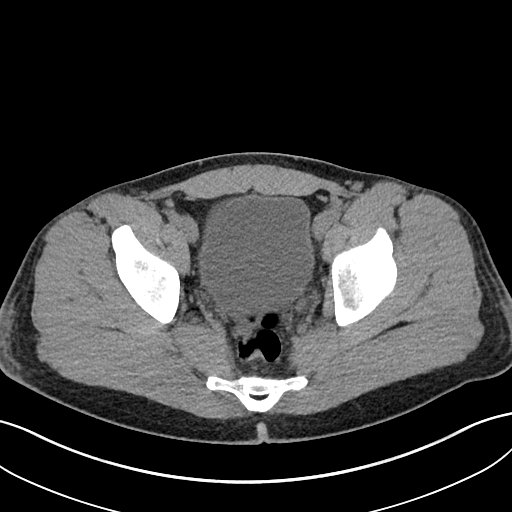
[im 32/91  soft-tissue]
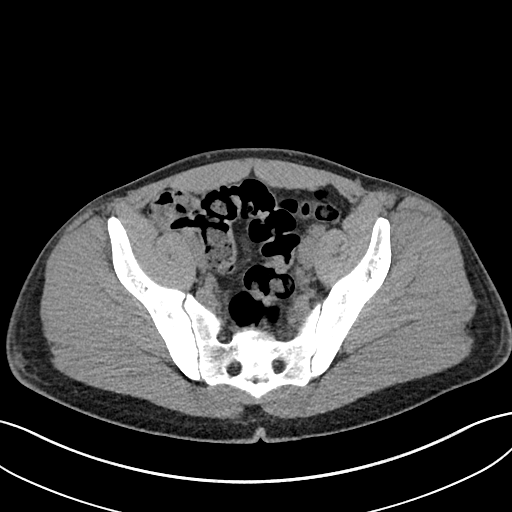
[im 38/91  soft-tissue]
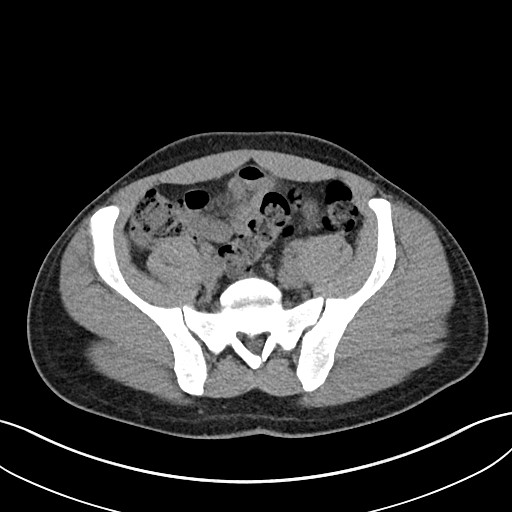
[im 48/91  soft-tissue]
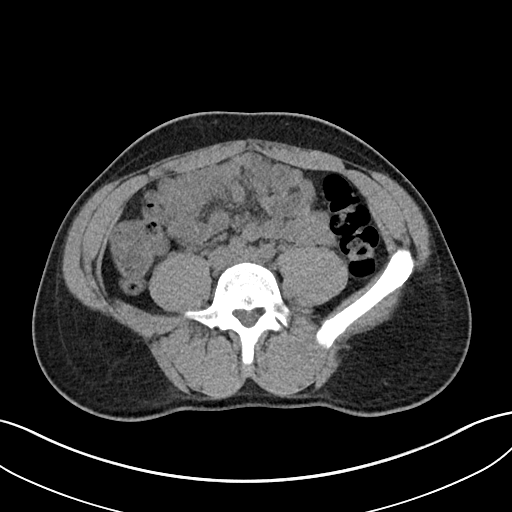
[im 53/91  soft-tissue]
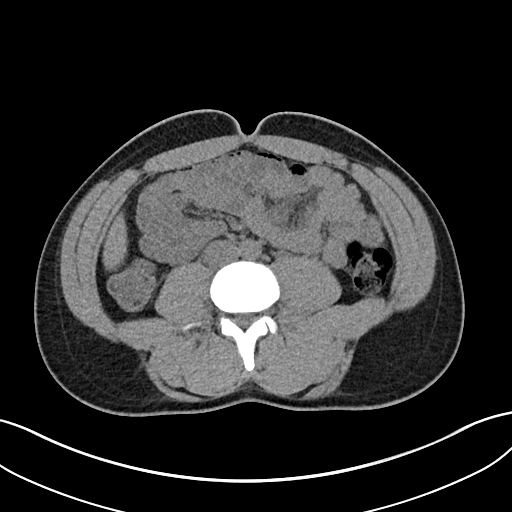
[im 59/91  soft-tissue]
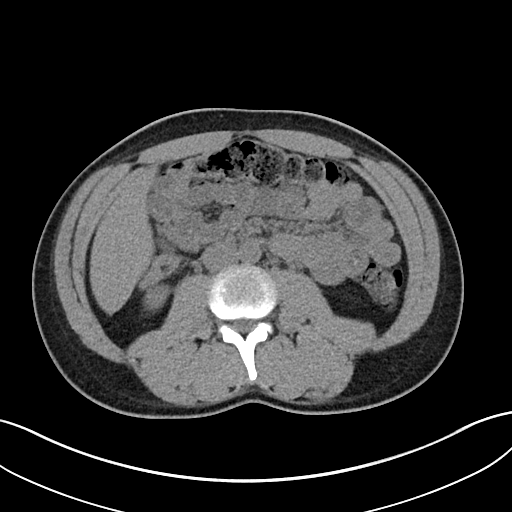
[im 59/91  bone]
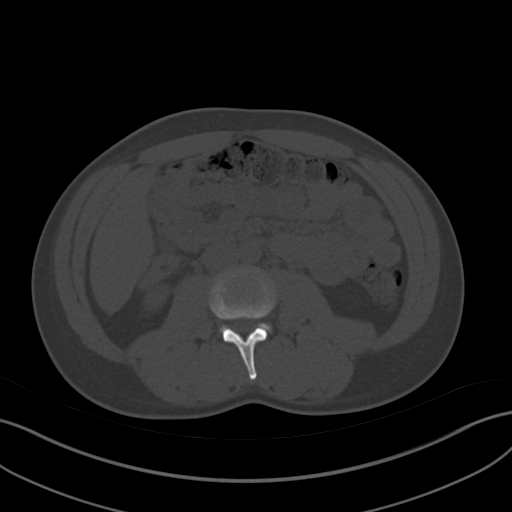
[im 64/91  soft-tissue]
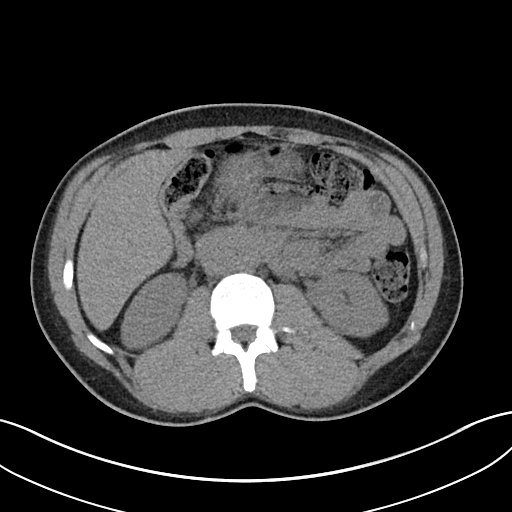
[im 69/91  soft-tissue]
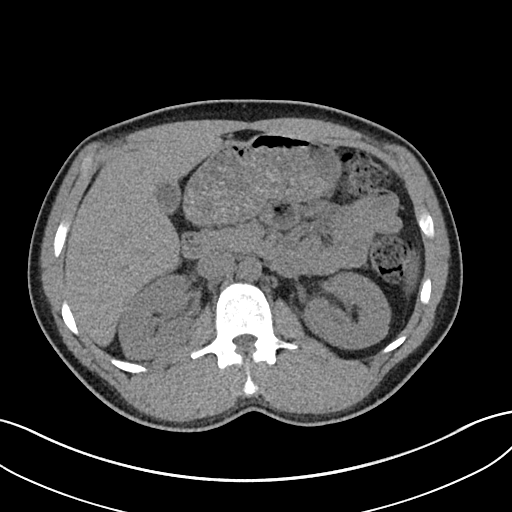
[im 80/91  soft-tissue]
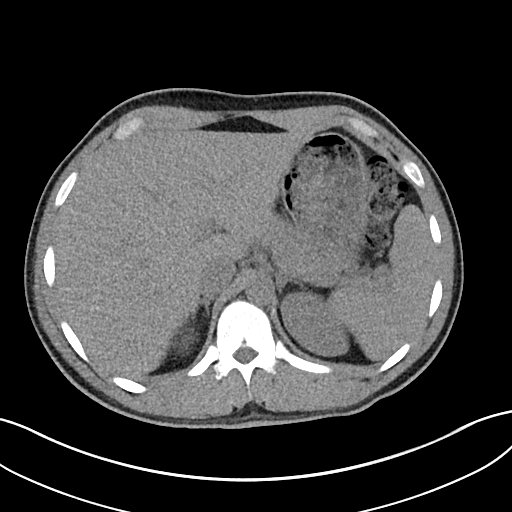
[im 85/91  soft-tissue]
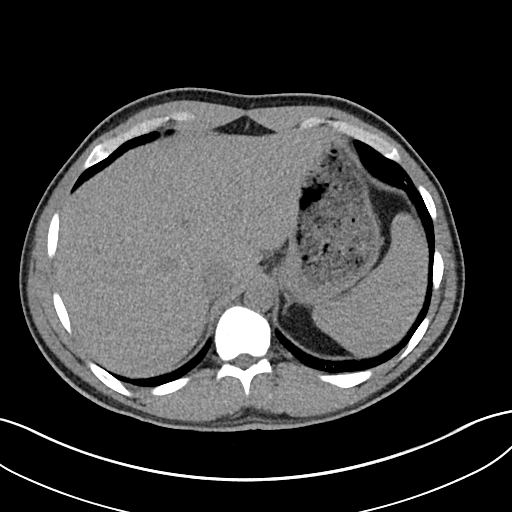

[Series 5: cor · coronal · 0.75mm/px · 3 of 87 slices shown]
[im 29/87  soft-tissue]
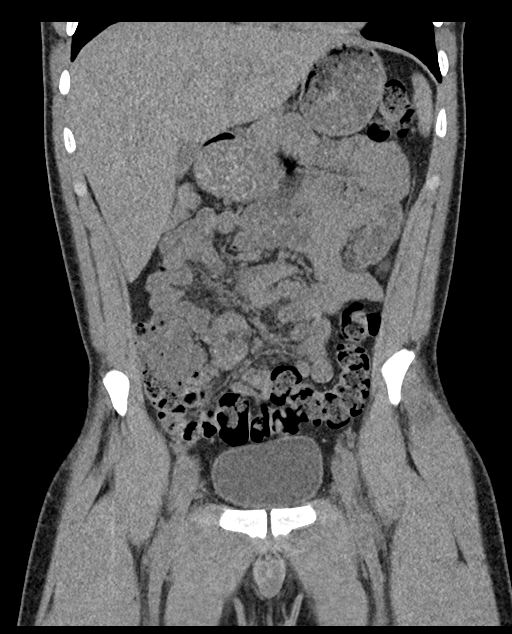
[im 39/87  soft-tissue]
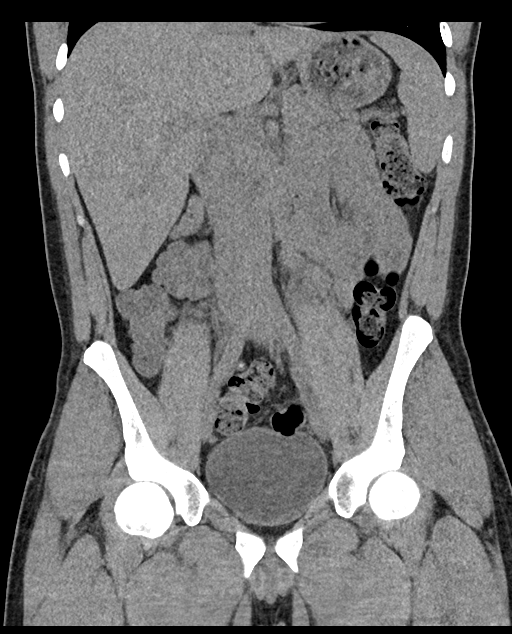
[im 48/87  soft-tissue]
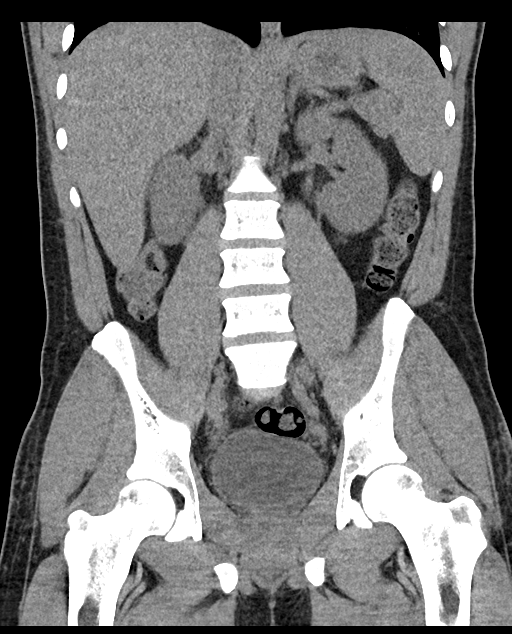

[16 of 46 positions shown; findings below may reference images not displayed]

FINDINGS: Lower chest: Lung bases demonstrate no consolidation or effusion.
Redemonstrated micronodularity at the lingula and left base with
considerations as previously described.

Hepatobiliary: No focal liver abnormality is seen. No gallstones,
gallbladder wall thickening, or biliary dilatation.

Pancreas: Unremarkable. No pancreatic ductal dilatation or
surrounding inflammatory changes.

Spleen: Normal in size without focal abnormality.

Adrenals/Urinary Tract: Adrenal glands are normal. No significant
hydronephrosis. Mild soft tissue stranding about the left renal
pelvis and ureter. Faint 3-4 mm stone at or just proximal to the
left UVJ. The bladder is unremarkable.

Stomach/Bowel: Stomach is within normal limits. Appendix appears
normal. No evidence of bowel wall thickening, distention, or
inflammatory changes.

Vascular/Lymphatic: No significant vascular findings are present. No
enlarged abdominal or pelvic lymph nodes.

Reproductive: Prostate is unremarkable.

Other: Negative for pelvic effusion or free air

Musculoskeletal: No acute or significant osseous findings.
IMPRESSION: 1. 3-4 mm stone in the distal left ureter at or just proximal to the
left UVJ. There is mild left periureteral soft tissue stranding but
not much hydronephrosis or hydroureter.

## 2021-11-15 MED ORDER — SODIUM CHLORIDE 0.9 % IV SOLN
1.0000 g | Freq: Once | INTRAVENOUS | Status: DC
  Filled 2021-11-15: qty 10

## 2021-11-15 MED ORDER — MORPHINE SULFATE (PF) 2 MG/ML IV SOLN
2.0000 mg | INTRAVENOUS | Status: DC | PRN
  Administered 2021-11-15 – 2021-11-16 (×4): 2 mg via INTRAVENOUS
  Filled 2021-11-15 (×4): qty 1

## 2021-11-15 MED ORDER — SODIUM CHLORIDE 0.9 % IV SOLN: 1.0000 g | Freq: Once | INTRAVENOUS | Status: DC

## 2021-11-15 MED ORDER — ACETAMINOPHEN 325 MG PO TABS: 650.0000 mg | ORAL_TABLET | Freq: Four times a day (QID) | ORAL | Status: DC | PRN

## 2021-11-15 MED ORDER — CEFTRIAXONE SODIUM 1 G IJ SOLR: 1.0000 g | INTRAMUSCULAR | Status: DC

## 2021-11-15 MED ORDER — ONDANSETRON HCL 4 MG/2ML IJ SOLN
4.0000 mg | Freq: Four times a day (QID) | INTRAMUSCULAR | Status: DC | PRN
Start: 2021-11-15 — End: 2021-11-16
  Administered 2021-11-15 – 2021-11-16 (×2): 4 mg via INTRAVENOUS
  Filled 2021-11-15 (×2): qty 2

## 2021-11-15 MED ORDER — OXYCODONE-ACETAMINOPHEN 5-325 MG PO TABS
1.0000 | ORAL_TABLET | Freq: Four times a day (QID) | ORAL | Status: DC | PRN
  Administered 2021-11-15 – 2021-11-16 (×2): 1 via ORAL
  Filled 2021-11-15 (×2): qty 1

## 2021-11-15 MED ORDER — SODIUM CHLORIDE 0.9 % IV SOLN: INTRAVENOUS | Status: DC

## 2021-11-15 MED ORDER — SODIUM CHLORIDE 0.9 % IV BOLUS
1000.0000 mL | Freq: Once | INTRAVENOUS | Status: AC
  Administered 2021-11-15: 1000 mL via INTRAVENOUS

## 2021-11-15 MED ORDER — ACETAMINOPHEN 650 MG RE SUPP: 650.0000 mg | Freq: Four times a day (QID) | RECTAL | Status: DC | PRN

## 2021-11-15 MED ORDER — SODIUM CHLORIDE 0.9 % IV SOLN
2.0000 g | Freq: Once | INTRAVENOUS | Status: AC
  Administered 2021-11-15: 2 g via INTRAVENOUS
  Filled 2021-11-15: qty 20

## 2021-11-15 MED ORDER — CEFTRIAXONE SODIUM 1 G IJ SOLR
1.0000 g | Freq: Once | INTRAMUSCULAR | Status: DC
  Filled 2021-11-15: qty 10

## 2021-11-15 MED ORDER — ONDANSETRON HCL 4 MG PO TABS: 4.0000 mg | ORAL_TABLET | Freq: Four times a day (QID) | ORAL | Status: DC | PRN

## 2021-11-15 NOTE — Assessment & Plan Note (Signed)
N.p.o. after midnight in case of urologic procedure ?Urology consult ? ?

## 2021-11-15 NOTE — H&P (Signed)
?History and Physical  ? ? ?Patient: Damon Blair [id number]:[id number] DOB: Jan 24, 1991 ?DOA: 11/15/2021 ?DOS: the patient was seen and examined on 11/15/2021 ?PCP: Enid Baas, MD  ?Patient coming from: Home ? ?Chief Complaint:  ?Chief Complaint  ?Patient presents with  ? Flank Pain  ? ? ?HPI: Damon Blair is a 31 y.o. male with medical history significant for Multiple sclerosis, relapsing remitting, followed by neurology, diagnosed on 4/13 with left ureteral calculus and E. coli UTI, seen by urology and treated with Flomax and a 10-day course of Keflex who returns to the ED with sudden onset nausea vomiting and recurrent left-sided flank pain after an initial period of improvement.  He denies fever and chills. ?ED course and data review: Vitals in the ED within normal limits.  WBC 8600 with lactic acid 0.7 and procalcitonin less than 0.1.  CBC, CMP and lipase otherwise unremarkable.  Urine chlamydia and GC screen negative.  Urinalysis showed large leukocyte esterases and moderate blood.  Renal stone CT showed the following: ?IMPRESSION: ?1. 3-4 mm stone in the distal left ureter at or just proximal to the ?left UVJ. There is mild left periureteral soft tissue stranding but ?not much hydronephrosis or hydroureter ? ? ?The ED provider spoke with urologist, Dr. Lonna Cobb who recommended admission for antibiotics and possible stent placement in the a.m.  Patient started on Rocephin.  Hospitalist consulted for admission.  ? ?Review of Systems: As mentioned in the history of present illness. All other systems reviewed and are negative. ?Past Medical History:  ?Diagnosis Date  ? MS (multiple sclerosis) (HCC)   ? ?Past Surgical History:  ?Procedure Laterality Date  ? FRACTURE SURGERY Left   ? ?Social History:  reports that he has never smoked. He has never been exposed to tobacco smoke. He has never used smokeless tobacco. He reports that he does not currently use alcohol. He reports that he does not currently use  drugs. ? ?Allergies  ?Allergen Reactions  ? Amoxicillin-Pot Clavulanate Rash  ?  Rash ?  ? Clavulanic Acid   ? ? ?No family history on file. ? ?Prior to Admission medications   ?Medication Sig Start Date End Date Taking? Authorizing Provider  ?amphetamine-dextroamphetamine (ADDERALL) 5 MG tablet Take 5 mg by mouth daily. 08/30/20   [provider]  ?baclofen (LIORESAL) 20 MG tablet Take 20 mg by mouth 3 (three) times daily.    [provider]  ?baclofen (LIORESAL) 20 MG tablet Take by mouth. 09/21/21 12/20/21  [provider]  ?cephALEXin (KEFLEX) 500 MG capsule Take 500 mg by mouth 2 (two) times daily. 10/30/21   [provider]  ?cetirizine (ZYRTEC) 10 MG tablet Take 10 mg by mouth as needed.    [provider]  ?diazepam (VALIUM) 5 MG tablet Take 5 mg by mouth as needed for anxiety.    [provider]  ?diazepam (VALIUM) 5 MG tablet Take 1 tablet by mouth every 2-3 days for severe MS spasticity; Must last 30 days 09/15/21   [provider]  ?dronabinol (MARINOL) 5 MG capsule Take 5 mg by mouth 3 (three) times daily. 09/08/20   [provider]  ?glatiramer (COPAXONE) 20 MG/ML SOSY injection Inject 20 mg into the skin every other day. Inject 20mg  every other day ONLY three times weekly.    [provider]  ?imiquimod ) 5 % cream SMARTSIG:1 Packet(s) Topical 3 Times a Week 10/24/21   [provider]  ?oxyCODONE-acetaminophen (PERCOCET/ROXICET) 5-325 MG tablet Take 1 tablet by mouth every  6 (six) hours as needed for severe pain. 10/27/21   Sharyn Creamer, MD  ?pregabalin (LYRICA) 200 MG capsule Take 200 mg by mouth in the morning, at noon, and at bedtime.    [provider]  ?pregabalin (LYRICA) 200 MG capsule Take by mouth. 10/05/21 10/05/22  [provider]  ?rosuvastatin (CRESTOR) 10 MG tablet Take 1 tablet by mouth daily. 08/08/21   [provider]  ?tamsulosin (FLOMAX) 0.4 MG CAPS capsule Take 1 capsule  (0.4 mg total) by mouth daily. 10/27/21   Sharyn Creamer, MD  ? ? ?Physical Exam: ?Vitals:  ? 11/15/21 1758 11/15/21 1803 11/15/21 2136  ?BP:  127/82 102/74  ?Pulse:  93 75  ?Resp:  16 20  ?Temp:  98.2 ?F (36.8 ?C)   ?SpO2:  98% 98%  ?Weight: 86.1 kg    ?Height: 5\' 9"  (1.753 m)    ? ?Physical Exam ?Vitals and nursing note reviewed.  ?Constitutional:   ?   General: He is not in acute distress. ?HENT:  ?   Head: Normocephalic and atraumatic.  ?Cardiovascular:  ?   Rate and Rhythm: Normal rate and regular rhythm.  ?   Pulses: Normal pulses.  ?   Heart sounds: Normal heart sounds.  ?Pulmonary:  ?   Effort: Pulmonary effort is normal.  ?   Breath sounds: Normal breath sounds.  ?Abdominal:  ?   Palpations: Abdomen is soft.  ?   Tenderness: There is no abdominal tenderness. There is right CVA tenderness.  ?Neurological:  ?   Mental Status: Mental status is at baseline.  ? ? ? ?Data Reviewed: ?Relevant notes from primary care and specialist visits, past discharge summaries as available in EHR, including Care Everywhere. ?Prior diagnostic testing as pertinent to current admission diagnoses ?Updated medications and problem lists for reconciliation ?ED course, including vitals, labs, imaging, treatment and response to treatment ?Triage notes, nursing and pharmacy notes and ED provider's notes ?Notable results as noted in HPI ? ? ?Assessment and Plan: ?* Pyelonephritis of left kidney ?Continue Rocephin.  Urine culture from 4/13 grew E. coli ?We will keep n.p.o. after midnight ?IV fluids, IV antiemetics, IV pain meds and supportive care ? ? ?Hydronephrosis with urinary obstruction due to ureteral calculus ?N.p.o. after midnight in case of urologic procedure ?Urology consult ? ? ?MS (multiple sclerosis) (HCC) ?Followed by neurology.  On Copaxone ? ? ? ? ? ? ?Advance Care Planning:   Code Status: Not on file  ? ?Consults: Urologist, Dr. 5/13 ? ?Family Communication: none ? ?Severity of Illness: ?The appropriate patient status for  this patient is OBSERVATION. Observation status is judged to be reasonable and necessary in order to provide the required intensity of service to ensure the patient's safety. The patient's presenting symptoms, physical exam findings, and initial radiographic and laboratory data in the context of their medical condition is felt to place them at decreased risk for further clinical deterioration. Furthermore, it is anticipated that the patient will be medically stable for discharge from the hospital within 2 midnights of admission.  ? ?Author: ?Lonna Cobb, MD ?11/15/2021 9:49 PM ? ?For on call review [email or web address].  ?

## 2021-11-15 NOTE — Telephone Encounter (Signed)
Would recommend visit today or tomorrow for urinalysis to evaluate for UTI or microscopic hematuria.  I can see him today in [place], or look at PA schedule today and tomorrow in [place] ? ?Legrand Rams, MD ?11/15/2021 ? ?

## 2021-11-15 NOTE — Telephone Encounter (Signed)
Error

## 2021-11-15 NOTE — Assessment & Plan Note (Signed)
Continue Rocephin.  Urine culture from 4/13 grew E. coli ?We will keep n.p.o. after midnight ?IV fluids, IV antiemetics, IV pain meds and supportive care ? ?

## 2021-11-15 NOTE — Telephone Encounter (Signed)
Urine shows possible infection, but no blood. Please start Bactrim DS bid x 5 days, we may need to consider CT to confirm stone passage per Dr .Sung Amabile . Talked with patient and he states he is going to Er . I did not send in Bactrim ?

## 2021-11-15 NOTE — ED Triage Notes (Signed)
Presents with flank pain and dysuria  states he was recently seen for same    states he finished antibiotics   . sx's returned ?

## 2021-11-15 NOTE — Assessment & Plan Note (Signed)
Followed by neurology.  On Copaxone ?

## 2021-11-15 NOTE — ED Provider Notes (Signed)
? ?[facility] ?Provider Note ? ?Patient Contact: 7:28 PM (approximate) ? ? ?History  ? ?Flank Pain ? ? ?HPI ? ?Damon Blair is a 31 y.o. male with a history of nephrolithiasis and recent UTI diagnosed on 4/13 presents to the emergency department with bilateral flank pain, right worse than left, dysuria, changes in urinary stream and testicular tenderness.  Patient states that he has had fever and chills at home.  Patient had a follow-up appointment with urology, Dr. Irish Elders on 426 and was advised that he had passed a 3 cm kidney stone.  Patient reports that he was asymptomatic at that time and had recently finished Keflex twice daily dosing for his UTI which grew E. coli.  He states that he has had some nausea but no vomiting.  No chest pain, chest tightness or shortness of breath. ? ?  ? ? ?Physical Exam  ? ?Triage Vital Signs: ?ED Triage Vitals  ?Enc Vitals Group  ?   BP 11/15/21 1803 127/82  ?   Pulse Rate 11/15/21 1803 93  ?   Resp 11/15/21 1803 16  ?   Temp 11/15/21 1803 98.2 ?F (36.8 ?C)  ?   Temp src --   ?   SpO2 11/15/21 1803 98 %  ?   Weight 11/15/21 1758 189 lb 13.1 oz (86.1 kg)  ?   Height 11/15/21 1758 5\' 9"  (1.753 m)  ?   Head Circumference --   ?   Peak Flow --   ?   Pain Score --   ?   Pain Loc --   ?   Pain Edu? --   ?   Excl. in GC? --   ? ? ?Most recent vital signs: ?Vitals:  ? 11/15/21 1803 11/15/21 2136  ?BP: 127/82 102/74  ?Pulse: 93 75  ?Resp: 16 20  ?Temp: 98.2 ?F (36.8 ?C)   ?SpO2: 98% 98%  ? ? ? ?General: Alert and in no acute distress. ?Eyes:  PERRL. EOMI. ?Head: No acute traumatic findings ?ENT: ?     Ears: Tms are pearly.  ?     Nose: No congestion/rhinnorhea. ?     Mouth/Throat: Mucous membranes are moist. ?Neck: No stridor. No cervical spine tenderness to palpation. ?Cardiovascular:  Good peripheral perfusion ?Respiratory: Normal respiratory effort without tachypnea or retractions. Lungs CTAB. Good air entry to the bases with no decreased or absent breath  sounds. ?Gastrointestinal: Bowel sounds ?4 quadrants. Soft and nontender to palpation. No guarding or rigidity. No palpable masses. No distention. No CVA tenderness. Patient has bilateral CVA tenderness.  ?Musculoskeletal: Full range of motion to all extremities.  ?Neurologic:  No gross focal neurologic deficits are appreciated.  ?Skin:   No rash noted ?Other: ? ? ?ED Results / Procedures / Treatments  ? ?Labs ?(all labs ordered are listed, but only abnormal results are displayed) ?Labs Reviewed  ?URINALYSIS, ROUTINE W REFLEX MICROSCOPIC - Abnormal; Notable for the following components:  ?    Result Value  ? Color, Urine YELLOW (*)   ? APPearance HAZY (*)   ? Specific Gravity, Urine 1.004 (*)   ? Hgb urine dipstick MODERATE (*)   ? Protein, ur 100 (*)   ? Leukocytes,Ua LARGE (*)   ? Bacteria, UA RARE (*)   ? All other components within normal limits  ?CBC WITH DIFFERENTIAL/PLATELET - Abnormal; Notable for the following components:  ? Hemoglobin 12.2 (*)   ? HCT 36.5 (*)   ? All other components within normal  limits  ?COMPREHENSIVE METABOLIC PANEL - Abnormal; Notable for the following components:  ? Glucose, Bld 108 (*)   ? All other components within normal limits  ?CHLAMYDIA/NGC RT PCR ([facility] ONLY)            ?LIPASE, BLOOD  ?LACTIC ACID, PLASMA  ?PROCALCITONIN  ?PROCALCITONIN  ?HIV ANTIBODY (ROUTINE TESTING W REFLEX)  ? ? ? ? ? ? ?RADIOLOGY ? ?I personally viewed and evaluated these images as part of my medical decision making, as well as reviewing the written report by the radiologist. ? ?ED Provider Interpretation: I personally reviewed CT renal stone study.  Patient has 3 to 4 mm stone in the distal left ureter. ? ? ?PROCEDURES: ? ?Critical Care performed: No ? ?Procedures ? ? ?MEDICATIONS ORDERED IN ED: ?Medications  ?oxyCODONE-acetaminophen (PERCOCET/ROXICET) 5-325 MG per tablet 1 tablet (has no administration in time range)  ?acetaminophen (TYLENOL) tablet 650 mg (has no administration in time range)  ?  Or   ?acetaminophen (TYLENOL) suppository 650 mg (has no administration in time range)  ?ondansetron (ZOFRAN) tablet 4 mg (has no administration in time range)  ?  Or  ?ondansetron (ZOFRAN) injection 4 mg (has no administration in time range)  ?0.9 %  sodium chloride infusion (has no administration in time range)  ?morphine (PF) 2 MG/ML injection 2 mg (has no administration in time range)  ?cefTRIAXone (ROCEPHIN) 1 g in sodium chloride 0.9 % 100 mL IVPB (has no administration in time range)  ?sodium chloride 0.9 % bolus 1,000 mL (0 mLs Intravenous Stopped 11/15/21 2137)  ?cefTRIAXone (ROCEPHIN) 2 g in sodium chloride 0.9 % 100 mL IVPB (0 g Intravenous Stopped 11/15/21 2210)  ? ? ? ?IMPRESSION / MDM / ASSESSMENT AND PLAN / ED COURSE  ?I reviewed the triage vital signs and the nursing notes. ?             ?               ? ?Assessment and plan:  ?Nephrolithiasis ?31 year old male presents to the emergency department with concern for fever, chills, bilateral flank pain, testicular soreness, dysuria and changes in urinary stream. ? ?Vital signs are reassuring at triage.  On physical exam, patient was alert, active and nontoxic-appearing.  Urinalysis concerning for UTI with rare bacteria and large leukocytes as well as moderate blood. ? ?CBC and CMP unremarkable.  Procalcitonin and lactic acid within range.  Lipase within range.  CT renal stone study shows a 3 mm stone near the UVJ.  I reached out to the urologist on-call, Dr. Lonna Cobb who recommended admission for IV antibiotics with a plan for possible stone removal tomorrow. ?  ? ? ?FINAL CLINICAL IMPRESSION(S) / ED DIAGNOSES  ? ?Final diagnoses:  ?Nephrolithiasis  ? ? ? ?Rx / DC Orders  ? ?ED Discharge Orders   ? ? None  ? ?  ? ? ? ?Note:  This document was prepared using Dragon voice recognition software and may include unintentional dictation errors. ?  ?Orvil Feil, PA-C ?11/15/21 2247 ? ?  ?Gilles Chiquito, MD ?11/15/21 2323 ? ?

## 2021-11-16 ENCOUNTER — Other Ambulatory Visit: Payer: Self-pay | Admitting: Physician Assistant

## 2021-11-16 ENCOUNTER — Telehealth: Payer: Self-pay

## 2021-11-16 DIAGNOSIS — N201 Calculus of ureter: Secondary | ICD-10-CM

## 2021-11-16 DIAGNOSIS — N390 Urinary tract infection, site not specified: Secondary | ICD-10-CM

## 2021-11-16 LAB — HIV ANTIBODY (ROUTINE TESTING W REFLEX): HIV Screen 4th Generation wRfx: NONREACTIVE

## 2021-11-16 LAB — PROCALCITONIN: Procalcitonin: <0.1 ng/mL

## 2021-11-16 MED ORDER — CEPHALEXIN 500 MG PO CAPS: 1000.0000 mg | ORAL_CAPSULE | Freq: Four times a day (QID) | ORAL | 0 refills | Status: AC

## 2021-11-16 MED ORDER — ACETAMINOPHEN 325 MG PO TABS: 650.0000 mg | ORAL_TABLET | Freq: Four times a day (QID) | ORAL | Status: DC | PRN

## 2021-11-16 MED ORDER — OXYCODONE-ACETAMINOPHEN 5-325 MG PO TABS: 1.0000 | ORAL_TABLET | Freq: Four times a day (QID) | ORAL | 0 refills | Status: DC | PRN

## 2021-11-16 MED ORDER — ONDANSETRON HCL 4 MG PO TABS
4.0000 mg | ORAL_TABLET | Freq: Four times a day (QID) | ORAL | 0 refills | Status: DC | PRN
Start: 2021-11-16 — End: 2023-02-07

## 2021-11-16 NOTE — ED Notes (Signed)
Pt will be discharged once printed prescription is signed by attending provider.  ?

## 2021-11-16 NOTE — Progress Notes (Signed)
Admission profile updated. ?

## 2021-11-16 NOTE — ED Notes (Signed)
Urology at bedside. Pt up to use bathroom. Steady gait noted.  ?

## 2021-11-16 NOTE — Progress Notes (Signed)
Surgical Physician Order Form [facility] [place] ? ?Dr. Diamantina Providence * Scheduling expectation :  11/18/2021 ? ?*Length of Case:  ? ?*Clearance needed: no ? ?*Anticoagulation Instructions: N/A ? ?*Aspirin Instructions: N/A ? ?*Post-op visit Date/Instructions:  1 month with RUS prior ? ?*Diagnosis: Left Ureteral Stone ? ?*Procedure: left Ureteroscopy w/laser lithotripsy & stent placement [id number]) ? ? ?Additional orders: N/A ? ?-Admit type: OUTpatient ? ?-Anesthesia: General ? ?-VTE Prophylaxis Standing Order SCD?s    ?   ?Other:  ? ?-Standing Lab Orders Per Anesthesia   ? ?Lab other: None ? ?-Standing Test orders EKG/Chest x-ray per Anesthesia      ? ?Test other:  ? ?- Medications:  Ancef 2gm IV ? ?-Other orders:  N/A ? ? ? ?   ?

## 2021-11-16 NOTE — ED Notes (Signed)
Pt called for RN stating his pain has increased since walking to urinate. States urination is painful and stream is weak. Pt asking again if can have PO oxycodone. Had messaged provider earlier who said pt is strict NPO, which was prior to urology seeing pt at bedside. Urology told pt he may discharge today and f/u outpatient. Provider messaged again re: PO oxy given this change of plan. ?

## 2021-11-16 NOTE — Progress Notes (Signed)
[facility] Posting Form  ? ?Surgery Date/Time: Date: 11/18/2021 ? ?Surgeon: Dr. Nickolas Madrid, MD ? ?Surgery Location: Day Surgery ? ?Inpt ( No  )   Outpt (Yes)   Obs ( No  )  ? ?Diagnosis: N20.1 Left Ureteral Stone ? ?-CPT: [id number] ? ?Surgery: Left Ureteroscopy with laser lithotripsy and stent placement ? ?Stop Anticoagulations: N/A ? ?Cardiac/Medical/Pulmonary Clearance needed: no ? ?*Orders entered into EPIC  Date: 11/16/21  ? ?*Case booked in [place]  Date: 11/16/21 ? ?*Notified pt of Surgery: Date: 11/16/21 ? ?PRE-OP UA & CX: no ? ?*Placed into Prior Authorization Work Que Date: 11/16/21 ? ? ?Assistant/laser/rep:No ? ? ? ? ? ? ? ? ? ? ? ? ? ? ? ?

## 2021-11-16 NOTE — Consult Note (Addendum)
? ?Urology Consult ? ?I have been asked to see the patient by Dr. Para March, for evaluation and management of ureteral calculus. ? ?Chief Complaint: Left renal colic ? ?History of Present Illness: Damon Blair is a 31 y.o. year old male with PMH MS and a previously identified 4 mm distal left ureteral stone associated with E. coli UTI that was presumed to have passed who presented to the emergency department last night with reports of return of left flank pain. ? ?Admission CT stone study revealed a persistent 4 mm distal left ureteral stone associated with mild periureteral stranding without significant hydronephrosis or perinephric stranding.  UA with no nitrites, 6-10 RBCs/hpf, 21-50 WBCs/hpf, and rare bacteria, urine culture pending.  White count is normal at 8.6.  Creatinine normal at baseline, 0.85.  Lactate normal at 0.7.  He has been afebrile, VSS. ? ?Today he reports his left flank pain returned 2 nights ago and was accompanied by subjective fever, chills, and nausea without vomiting.  He reports some mild flank discomfort and dysuria this morning but overall is rather comfortable appearing. He has received antibiotics as below. ? ?Anti-infectives (From admission, onward)  ? ? Start     Dose/Rate Route Frequency Ordered Stop  ? 11/16/21 2200  cefTRIAXone (ROCEPHIN) 1 g in sodium chloride 0.9 % 100 mL IVPB       ? 1 g ?200 mL/hr over 30 Minutes Intravenous Every 24 hours 11/15/21 2209    ? 11/15/21 2130  cefTRIAXone (ROCEPHIN) 1 g in sodium chloride 0.9 % 100 mL IVPB  Status:  Discontinued       ? 1 g ?200 mL/hr over 30 Minutes Intravenous  Once 11/15/21 2121 11/15/21 2123  ? 11/15/21 2130  cefTRIAXone (ROCEPHIN) 2 g in sodium chloride 0.9 % 100 mL IVPB       ? 2 g ?200 mL/hr over 30 Minutes Intravenous  Once 11/15/21 2123 11/15/21 2210  ? 11/15/21 2100  cefTRIAXone (ROCEPHIN) 1 g in sodium chloride 0.9 % 100 mL IVPB  Status:  Discontinued       ? 1 g ?200 mL/hr over 30 Minutes Intravenous  Once 11/15/21  2049 11/15/21 2123  ? 11/15/21 2045  cefTRIAXone (ROCEPHIN) injection 1 g  Status:  Discontinued       ? 1 g Intramuscular  Once 11/15/21 2031 11/15/21 2049  ? ?  ?  ? ?Past Medical History:  ?Diagnosis Date  ? MS (multiple sclerosis) (HCC)   ? ? ?Past Surgical History:  ?Procedure Laterality Date  ? FRACTURE SURGERY Left   ? ? ?Home Medications:  ?Current Meds  ?Medication Sig  ? amoxicillin (AMOXIL) 500 MG capsule Take 500 mg by mouth 3 (three) times daily.  ? ? ?Allergies:  ?Allergies  ?Allergen Reactions  ? Amoxicillin-Pot Clavulanate Rash  ?  Rash ?  ? Clavulanic Acid   ? ? ?No family history on file. ? ?Social History:  reports that he has never smoked. He has never been exposed to tobacco smoke. He has never used smokeless tobacco. He reports that he does not currently use alcohol. He reports that he does not currently use drugs. ? ?ROS: ?A complete review of systems was performed.  All systems are negative except for pertinent findings as noted. ? ?Physical Exam:  ?Vital signs in last 24 hours: ?Temp:  [98.2 ?F (36.8 ?C)-98.6 ?F (37 ?C)] 98.6 ?F (37 ?C) (05/02 2254) ?Pulse Rate:  [61-93] 63 (05/03 0745) ?Resp:  [14-20] 14 (05/03 0745) ?BP: (102-127)/(65-82) 114/65 (  05/03 0745) ?SpO2:  [98 %-99 %] 99 % (05/03 0745) ?Weight:  [86.1 kg] 86.1 kg (05/02 1758) ?Constitutional:  Alert and oriented, no acute distress ?HEENT: [place] AT, moist mucus membranes ?Cardiovascular: No clubbing, cyanosis, or edema ?Respiratory: Normal respiratory effort ?Skin: No rashes, bruises or suspicious lesions ?Neurologic: Grossly intact, no focal deficits, moving all 4 extremities ?Psychiatric: Normal mood and affect ? ?Laboratory Data:  ?Recent Labs  ?  11/15/21 ?1900  ?WBC 8.6  ?HGB 12.2*  ?HCT 36.5*  ? ?Recent Labs  ?  11/15/21 ?1900  ?NA 138  ?K 4.0  ?CL 100  ?CO2 27  ?GLUCOSE 108*  ?BUN 7  ?CREATININE 0.85  ?CALCIUM 9.7  ? ?Urinalysis ?   ?Component Value Date/Time  ? COLORURINE YELLOW (A) 11/15/2021 1814  ? APPEARANCEUR HAZY (A)  11/15/2021 1814  ? APPEARANCEUR Clear 11/09/2021 0806  ? LABSPEC 1.004 (L) 11/15/2021 1814  ? PHURINE 6.0 11/15/2021 1814  ? GLUCOSEU NEGATIVE 11/15/2021 1814  ? HGBUR MODERATE (A) 11/15/2021 1814  ? BILIRUBINUR NEGATIVE 11/15/2021 1814  ? BILIRUBINUR Negative 11/09/2021 0806  ? [name] NEGATIVE 11/15/2021 1814  ? PROTEINUR 100 (A) 11/15/2021 1814  ? NITRITE NEGATIVE 11/15/2021 1814  ? LEUKOCYTESUR LARGE (A) 11/15/2021 1814  ? ?Results for orders placed or performed during the hospital encounter of 11/15/21  ?Chlamydia/NGC rt PCR ([facility] only)     Status: None  ? Collection Time: 11/15/21  6:40 PM  ?Result Value Ref Range Status  ? Specimen source GC/Chlam URINE, RANDOM  Final  ? Chlamydia Tr NOT DETECTED NOT DETECTED Final  ? N gonorrhoeae NOT DETECTED NOT DETECTED Final  ?  Comment: (NOTE) ?This CT/NG assay has not been evaluated in patients with a history of  ?hysterectomy. ?Performed at [facility], [address] [place], ?[place] [address] ?  ? ? ?Radiologic Imaging: ?CT Renal Stone Study ? ?Result Date: 11/15/2021 ?CLINICAL DATA:  Flank pain dysuria EXAM: CT ABDOMEN AND PELVIS WITHOUT CONTRAST TECHNIQUE: Multidetector CT imaging of the abdomen and pelvis was performed following the standard protocol without IV contrast. RADIATION DOSE REDUCTION: This exam was performed according to the departmental dose-optimization program which includes automated exposure control, adjustment of the mA and/or kV according to patient size and/or use of iterative reconstruction technique. COMPARISON:  CT 10/27/2021 FINDINGS: Lower chest: Lung bases demonstrate no consolidation or effusion. Redemonstrated micronodularity at the lingula and left base with considerations as previously described. Hepatobiliary: No focal liver abnormality is seen. No gallstones, gallbladder wall thickening, or biliary dilatation. Pancreas: Unremarkable. No pancreatic ductal dilatation or surrounding inflammatory changes. Spleen: Normal in  size without focal abnormality. Adrenals/Urinary Tract: Adrenal glands are normal. No significant hydronephrosis. Mild soft tissue stranding about the left renal pelvis and ureter. Faint 3-4 mm stone at or just proximal to the left UVJ. The bladder is unremarkable. Stomach/Bowel: Stomach is within normal limits. Appendix appears normal. No evidence of bowel wall thickening, distention, or inflammatory changes. Vascular/Lymphatic: No significant vascular findings are present. No enlarged abdominal or pelvic lymph nodes. Reproductive: Prostate is unremarkable. Other: Negative for pelvic effusion or free air Musculoskeletal: No acute or significant osseous findings. IMPRESSION: 1. 3-4 mm stone in the distal left ureter at or just proximal to the left UVJ. There is mild left periureteral soft tissue stranding but not much hydronephrosis or hydroureter. Electronically Signed   By: Jasmine Pang M.D.   On: 11/15/2021 21:05   ? ?Assessment & Plan:  ?31 year old male with MS and a persistent 4 mm  distal left ureteral stone with UTI.  No evidence of systemic infection secondary to UTI, so no indication for urgent intervention at this time. ? ?Given that his pain is well controlled, okay to discharge from the ED today.  We discussed that we recommend proceeding with ureteroscopy with laser lithotripsy and stent placement for definitive management of the stone within the next week, and Drs. Stoioff and Richardo Hanks are coordinating timing for this.  Notably, patient reports he will have difficulty with transportation tomorrow, but he would be available on Friday, 5/5 to do this. ? ?We discussed return precautions including fever greater than 101 ?F, uncontrollable pain, and uncontrollable nausea/vomiting.  He expressed understanding. ? ?Recommendations: ?-Okay to advance diet, no plans for urgent intervention today ?-Okay for discharge with p.o. antibiotics, antiemetics, and pain medications ?-Follow urine cultures ?-Outpatient  ureteroscopy with laser lithotripsy and stent placement within the next week, our surgical scheduler to contact him to arrange ? ?Thank you for involving me in this patient's care, please page with any fu

## 2021-11-16 NOTE — ED Notes (Signed)
Pt provided with printed rx and discharged with AVS. Pt walked out with partner. Steady gait noted. ?

## 2021-11-16 NOTE — Progress Notes (Signed)
IV removed from patient. Discharge instructions given. Verbalized understanding. No acute distress at this time. Printed prescription handed to patient. Visitor at beside and will transport patient home.  ?

## 2021-11-16 NOTE — Telephone Encounter (Signed)
I spoke with Damon Blair. We have discussed possible surgery dates and Friday May 5th, 2023 was agreed upon by all parties. Patient given information about surgery date, what to expect pre-operatively and post operatively.  ?We discussed that a Pre-Admission Testing office will be calling to set up the pre-op visit that will take place prior to surgery, and that these appointments are typically done over the phone with a Pre-Admissions RN. ? Informed patient that our office will communicate any additional care to be provided after surgery. Patients questions or concerns were discussed during our call. Advised to call our office should there be any additional information, questions or concerns that arise. Patient verbalized understanding.  ? ?

## 2021-11-18 ENCOUNTER — Encounter
Admission: RE | Disposition: A | Discharge status: Home / Self Care | Payer: Self-pay | Source: Ambulatory Visit | Attending: Urology

## 2021-11-18 ENCOUNTER — Ambulatory Visit: Payer: Managed Care, Other (non HMO) | Admitting: Anesthesiology

## 2021-11-18 ENCOUNTER — Ambulatory Visit: Payer: Managed Care, Other (non HMO)

## 2021-11-18 ENCOUNTER — Encounter: Payer: Self-pay | Admitting: Urology

## 2021-11-18 ENCOUNTER — Ambulatory Visit
Admission: RE | Admit: 2021-11-18 | Discharge: 2021-11-18 | Disposition: A | Discharge status: Home / Self Care | Payer: Managed Care, Other (non HMO) | Source: Ambulatory Visit | Attending: Urology | Admitting: Urology

## 2021-11-18 ENCOUNTER — Other Ambulatory Visit: Payer: Self-pay

## 2021-11-18 DIAGNOSIS — Q648 Other specified congenital malformations of urinary system: Secondary | ICD-10-CM | POA: Diagnosis not present

## 2021-11-18 DIAGNOSIS — G35 Multiple sclerosis: Secondary | ICD-10-CM | POA: Diagnosis not present

## 2021-11-18 DIAGNOSIS — Z87891 Personal history of nicotine dependence: Secondary | ICD-10-CM | POA: Insufficient documentation

## 2021-11-18 DIAGNOSIS — N201 Calculus of ureter: Secondary | ICD-10-CM | POA: Diagnosis present

## 2021-11-18 HISTORY — PX: CYSTOSCOPY/URETEROSCOPY/HOLMIUM LASER/STENT PLACEMENT: SHX6546

## 2021-11-18 HISTORY — PX: CYSTOSCOPY W/ RETROGRADES: SHX1426

## 2021-11-18 LAB — URINE CULTURE: Culture: >=100000 — AB

## 2021-11-18 SURGERY — CYSTOSCOPY/URETEROSCOPY/HOLMIUM LASER/STENT PLACEMENT
Anesthesia: General | Laterality: Left

## 2021-11-18 MED ORDER — LACTATED RINGERS IV SOLN: INTRAVENOUS | Status: DC

## 2021-11-18 MED ORDER — MIDAZOLAM HCL 2 MG/2ML IJ SOLN
INTRAMUSCULAR | Status: AC
  Filled 2021-11-18: qty 2

## 2021-11-18 MED ORDER — PROPOFOL 500 MG/50ML IV EMUL
INTRAVENOUS | Status: AC
  Filled 2021-11-18: qty 50

## 2021-11-18 MED ORDER — LIDOCAINE HCL (PF) 2 % IJ SOLN
INTRAMUSCULAR | Status: AC
  Filled 2021-11-18: qty 5

## 2021-11-18 MED ORDER — KETOROLAC TROMETHAMINE 30 MG/ML IJ SOLN
INTRAMUSCULAR | Status: DC | PRN
  Administered 2021-11-18: 15 mg via INTRAVENOUS

## 2021-11-18 MED ORDER — SODIUM CHLORIDE 0.9 % IR SOLN
Status: DC | PRN
Start: 2021-11-18 — End: 2021-11-18
  Administered 2021-11-18: 3000 mL

## 2021-11-18 MED ORDER — OXYCODONE HCL 5 MG PO TABS
5.0000 mg | ORAL_TABLET | Freq: Once | ORAL | Status: AC | PRN
  Administered 2021-11-18: 5 mg via ORAL

## 2021-11-18 MED ORDER — MIDAZOLAM HCL 2 MG/2ML IJ SOLN
INTRAMUSCULAR | Status: DC | PRN
  Administered 2021-11-18: 2 mg via INTRAVENOUS

## 2021-11-18 MED ORDER — ACETAMINOPHEN 10 MG/ML IV SOLN: 1000.0000 mg | Freq: Once | INTRAVENOUS | Status: DC | PRN

## 2021-11-18 MED ORDER — IOHEXOL 180 MG/ML  SOLN
INTRAMUSCULAR | Status: DC | PRN
  Administered 2021-11-18: 20 mL

## 2021-11-18 MED ORDER — KETOROLAC TROMETHAMINE 10 MG PO TABS: 10.0000 mg | ORAL_TABLET | Freq: Four times a day (QID) | ORAL | 0 refills | Status: DC | PRN

## 2021-11-18 MED ORDER — PROPOFOL 10 MG/ML IV BOLUS
INTRAVENOUS | Status: AC
  Filled 2021-11-18: qty 20

## 2021-11-18 MED ORDER — ORAL CARE MOUTH RINSE: 15.0000 mL | Freq: Once | OROMUCOSAL | Status: DC

## 2021-11-18 MED ORDER — ONDANSETRON HCL 4 MG/2ML IJ SOLN
INTRAMUSCULAR | Status: AC
Start: 2021-11-18 — End: ?
  Filled 2021-11-18: qty 2

## 2021-11-18 MED ORDER — PROPOFOL 10 MG/ML IV BOLUS
INTRAVENOUS | Status: DC | PRN
  Administered 2021-11-18: 150 mg via INTRAVENOUS
  Administered 2021-11-18: 50 mg via INTRAVENOUS
  Administered 2021-11-18: 80 mg via INTRAVENOUS
  Administered 2021-11-18: 100 mg via INTRAVENOUS
  Administered 2021-11-18 (×2): 50 mg via INTRAVENOUS
  Administered 2021-11-18: 70 mg via INTRAVENOUS

## 2021-11-18 MED ORDER — OXYCODONE HCL 5 MG PO TABS
ORAL_TABLET | ORAL | Status: DC
Start: 2021-11-18 — End: 2021-11-18
  Filled 2021-11-18: qty 1

## 2021-11-18 MED ORDER — CEFAZOLIN SODIUM-DEXTROSE 2-4 GM/100ML-% IV SOLN
2.0000 g | INTRAVENOUS | Status: AC
  Administered 2021-11-18: 2 g via INTRAVENOUS

## 2021-11-18 MED ORDER — FENTANYL CITRATE (PF) 100 MCG/2ML IJ SOLN
INTRAMUSCULAR | Status: AC
  Filled 2021-11-18: qty 2

## 2021-11-18 MED ORDER — DEXAMETHASONE SODIUM PHOSPHATE 10 MG/ML IJ SOLN
INTRAMUSCULAR | Status: DC | PRN
  Administered 2021-11-18: 10 mg via INTRAVENOUS

## 2021-11-18 MED ORDER — LIDOCAINE HCL (CARDIAC) PF 100 MG/5ML IV SOSY
PREFILLED_SYRINGE | INTRAVENOUS | Status: DC | PRN
  Administered 2021-11-18: 80 mg via INTRAVENOUS

## 2021-11-18 MED ORDER — ONDANSETRON HCL 4 MG/2ML IJ SOLN: 4.0000 mg | Freq: Once | INTRAMUSCULAR | Status: DC | PRN

## 2021-11-18 MED ORDER — PROPOFOL 500 MG/50ML IV EMUL
INTRAVENOUS | Status: DC | PRN
  Administered 2021-11-18: 200 ug/kg/min via INTRAVENOUS

## 2021-11-18 MED ORDER — FENTANYL CITRATE (PF) 100 MCG/2ML IJ SOLN
INTRAMUSCULAR | Status: AC
Start: 2021-11-18 — End: ?
  Filled 2021-11-18: qty 2

## 2021-11-18 MED ORDER — DEXAMETHASONE SODIUM PHOSPHATE 10 MG/ML IJ SOLN
INTRAMUSCULAR | Status: AC
  Filled 2021-11-18: qty 1

## 2021-11-18 MED ORDER — FENTANYL CITRATE (PF) 100 MCG/2ML IJ SOLN: 25.0000 ug | INTRAMUSCULAR | Status: DC | PRN

## 2021-11-18 MED ORDER — KETOROLAC TROMETHAMINE 30 MG/ML IJ SOLN
INTRAMUSCULAR | Status: AC
  Filled 2021-11-18: qty 1

## 2021-11-18 MED ORDER — OXYCODONE HCL 5 MG/5ML PO SOLN: 5.0000 mg | Freq: Once | ORAL | Status: AC | PRN

## 2021-11-18 MED ORDER — CHLORHEXIDINE GLUCONATE 0.12 % MT SOLN: 15.0000 mL | Freq: Once | OROMUCOSAL | Status: DC

## 2021-11-18 MED ORDER — ONDANSETRON HCL 4 MG/2ML IJ SOLN
INTRAMUSCULAR | Status: DC | PRN
  Administered 2021-11-18: 4 mg via INTRAVENOUS

## 2021-11-18 MED ORDER — DEXMEDETOMIDINE (PRECEDEX) IN NS 20 MCG/5ML (4 MCG/ML) IV SYRINGE
PREFILLED_SYRINGE | INTRAVENOUS | Status: DC | PRN
  Administered 2021-11-18 (×2): 8 ug via INTRAVENOUS
  Administered 2021-11-18: 12 ug via INTRAVENOUS
  Administered 2021-11-18: 8 ug via INTRAVENOUS

## 2021-11-18 MED ORDER — FENTANYL CITRATE (PF) 100 MCG/2ML IJ SOLN
INTRAMUSCULAR | Status: DC | PRN
  Administered 2021-11-18 (×3): 50 ug via INTRAVENOUS
  Administered 2021-11-18 (×2): 25 ug via INTRAVENOUS

## 2021-11-18 SURGICAL SUPPLY — 36 items
ADH LQ OCL WTPRF AMP STRL LF (MISCELLANEOUS)
ADHESIVE MASTISOL STRL (MISCELLANEOUS) IMPLANT
BAG DRAIN CYSTO-URO LG1000N (MISCELLANEOUS) ×3 IMPLANT
BASKET ZERO TIP 1.9FR (BASKET) ×1 IMPLANT
BRUSH SCRUB EZ 1% IODOPHOR (MISCELLANEOUS) ×2 IMPLANT
BSKT STON RTRVL ZERO TP 1.9FR (BASKET) ×2
CATH URET FLEX-TIP 2 LUMEN 10F (CATHETERS) IMPLANT
CATH URETL OPEN 5X70 (CATHETERS) ×1 IMPLANT
CNTNR SPEC 2.5X3XGRAD LEK (MISCELLANEOUS)
CONT SPEC 4OZ STER OR WHT (MISCELLANEOUS)
CONT SPEC 4OZ STRL OR WHT (MISCELLANEOUS)
CONTAINER SPEC 2.5X3XGRAD LEK (MISCELLANEOUS) IMPLANT
DRAPE UTILITY 15X26 TOWEL STRL (DRAPES) ×3 IMPLANT
DRSG TEGADERM 2-3/8X2-3/4 SM (GAUZE/BANDAGES/DRESSINGS) IMPLANT
GLOVE SURG UNDER POLY LF SZ7.5 (GLOVE) ×3 IMPLANT
GOWN STRL REUS W/ TWL LRG LVL3 (GOWN DISPOSABLE) ×2 IMPLANT
GOWN STRL REUS W/ TWL XL LVL3 (GOWN DISPOSABLE) ×2 IMPLANT
GOWN STRL REUS W/TWL LRG LVL3 (GOWN DISPOSABLE) ×3
GOWN STRL REUS W/TWL XL LVL3 (GOWN DISPOSABLE) ×3
GUIDEWIRE GREEN .038 145CM (MISCELLANEOUS) IMPLANT
GUIDEWIRE STR DUAL SENSOR (WIRE) ×3 IMPLANT
GUIDEWIRE STR ZIPWIRE 035X150 (MISCELLANEOUS) ×1 IMPLANT
INFUSOR MANOMETER BAG 3000ML (MISCELLANEOUS) ×3 IMPLANT
IV NS IRRIG 3000ML ARTHROMATIC (IV SOLUTION) ×3 IMPLANT
KIT TURNOVER CYSTO (KITS) ×3 IMPLANT
PACK CYSTO AR (MISCELLANEOUS) ×3 IMPLANT
SET CYSTO W/LG BORE CLAMP LF (SET/KITS/TRAYS/PACK) ×3 IMPLANT
SHEATH NAVIGATOR HD 12/14X36 (SHEATH) IMPLANT
STENT URET 6FRX24 CONTOUR (STENTS) IMPLANT
STENT URET 6FRX26 CONTOUR (STENTS) ×1 IMPLANT
SURGILUBE 2OZ TUBE FLIPTOP (MISCELLANEOUS) ×3 IMPLANT
SYR 10ML LL (SYRINGE) ×3 IMPLANT
TRACTIP FLEXIVA PULSE ID 200 (Laser) ×1 IMPLANT
VALVE UROSEAL ADJ ENDO (VALVE) ×1 IMPLANT
WATER STERILE IRR 500ML POUR (IV SOLUTION) ×3 IMPLANT
zipwire glide/slick ×1 IMPLANT

## 2021-11-18 NOTE — Op Note (Signed)
Date of procedure: 11/18/21 ? ?Preoperative diagnosis:  ?Left distal ureteral stone ? ?Postoperative diagnosis:  ?Left distal ureteral stone ?Duplicated left collecting system ? ?Procedure: ?Cystoscopy, left ureteroscopy, laser lithotripsy, left retrograde pyelogram with intraoperative interpretation, left ureteral stent placement ? ?Surgeon: Legrand Rams, MD ? ?Anesthesia: General ? ?Complications: None ? 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 ?EBL: Minimal ? ?Specimens: None ? ?Drains: Left 6 French by 26 cm ureteral stent with Dangler ? ?Indication: Damon Blair is a 31 y.o. patient with ongoing left flank pain and recurrent UTI secondary to a minimally obstructing 3 mm left distal ureteral stone who failed a trial of medical expulsive therapy.  After reviewing the management options for treatment, they elected to proceed with the above surgical procedure(s). We have discussed the potential benefits and risks of the procedure, side effects of the proposed treatment, the likelihood of the patient achieving the goals of the procedure, and any potential problems that might occur during the procedure or recuperation. Informed consent has been obtained. ? ?Description of procedure: ? ?The patient was taken to the operating room and general anesthesia was induced. SCDs were placed for DVT prophylaxis.  The patient was placed in the dorsal lithotomy position, prepped and draped in the usual sterile fashion, and preoperative antibiotics(Ancef) were administered. A preoperative time-out was performed.  ? ?The 67 French rigid cystoscope was used to intubate the urethra and a normal-appearing urethra was followed proximally to the bladder.  The prostate was small.  Thorough cystoscopy  showed no abnormal bladder findings.  The right ureteral orifice was orthotopic, and there was a completely duplicated left collecting system with 2 ureteral orifices on the left side. ? ?I started with the more proximal ureteral orifice and a sensor wire advanced easily up to the kidney under fluoroscopic vision.  A semirigid ureteroscope advanced alongside the wire and no stones were identified.  A digital single-channel flexible ureteroscope then advanced over the wire and thorough pyeloscopy of what appeared to be the lower pole system showed no evidence of stone.  A retrograde pyelogram was performed from the proximal ureter and showed no extravasation or filling defects.  Careful pullback ureteroscopy showed no abnormalities.  Contrast drained briskly on fluoroscopy and under direct vision, no stent was placed. ? ?I then turned my attention to the more distal ureteral orifice which drained the smaller upper pole collecting system.  I was unable to advance a sensor wire alongside the stone and ultimately with the aid of a 5 French access catheter and Glidewire I was able to navigate the wire past the stone up to the kidney.  Semirigid ureteroscope was then advanced alongside the wire and there was a 3 mm yellow stone lodged in the distal ureter.  A 200 ?m laser fiber and settings of 1.0 J and 10 Hz was used to methodically fragment the stone, and all pieces were irrigated free from the ureter.  A retrograde pyelogram was performed from the mid ureter which showed a small upper pole collecting system with no extravasation or filling defects.  A sensor wire was advanced over the wire up to the kidney and a second retrograde pyelogram was performed to confirm no other abnormalities and this continues to show a small upper pole collecting system with no extravasation or filling defects.  A sensor wire was replaced into the kidney. ? ?The rigid cystoscope was backloaded over the wire, and a 6 [place] by 26 cm  ureteral stent was uneventfully placed.  Secondary to the small size of the collecting system there was not a curl proximally, but stent appeared to be positioned appropriately, and there is an excellent curl in the bladder under direct vision.  The bladder was drained, and the Dangler was secured to the phallus using Mastisol and Tegaderm. ? ? ?Disposition: Stable to PACU ? ?Plan: ?Remove stent at home on Monday morning ?Follow-up in urology clinic in 3 months for symptom check ? ?Legrand Rams, MD ? ?

## 2021-11-18 NOTE — Anesthesia Postprocedure Evaluation (Signed)
Anesthesia Post Note ? ?Patient: Damon Blair ? ?Procedure(s) Performed: CYSTOSCOPY/URETEROSCOPY/HOLMIUM LASER/STENT PLACEMENT (Left) ?CYSTOSCOPY WITH RETROGRADE PYELOGRAM ? ?Patient location during evaluation: PACU ?Anesthesia Type: General ?Level of consciousness: awake and alert, oriented and patient cooperative ?Pain management: pain level controlled ?Vital Signs Assessment: post-procedure vital signs reviewed and stable ?Respiratory status: spontaneous breathing, nonlabored ventilation and respiratory function stable ?Cardiovascular status: blood pressure returned to baseline and stable ?Postop Assessment: adequate PO intake ?Anesthetic complications: no ? ? ?No notable events documented. ? ? ?Last Vitals:  ?Vitals:  ? 11/18/21 1215 11/18/21 1237  ?BP: 108/73 123/84  ?Pulse: 63 (!) 54  ?Resp: 12 16  ?Temp: (!) 36.3 ?C 36.4 ?C  ?SpO2: 97% 100%  ?  ?Last Pain:  ?Vitals:  ? 11/18/21 1237  ?TempSrc: Temporal  ?PainSc: 6   ? ? ?  ?  ?  ?  ?  ?  ? ?Damon Blair ? ? ? ? ?

## 2021-11-18 NOTE — Transfer of Care (Signed)
Immediate Anesthesia Transfer of Care Note ? ?Patient: Damon Blair ? ?Procedure(s) Performed: CYSTOSCOPY/URETEROSCOPY/HOLMIUM LASER/STENT PLACEMENT (Left) ?CYSTOSCOPY WITH RETROGRADE PYELOGRAM ? ?Patient Location: PACU ? ?Anesthesia Type:General ? ?Level of Consciousness: awake, alert  and oriented ? ?Airway & Oxygen Therapy: Patient Spontanous Breathing ? ?Post-op Assessment: Report given to RN and Post -op Vital signs reviewed and stable ? ?Post vital signs: Reviewed and stable ? ?Last Vitals:  ?Vitals Value Taken Time  ?BP 100/58 11/18/21 1143  ?Temp    ?Pulse 51 11/18/21 1144  ?Resp 8 11/18/21 1144  ?SpO2 100 % 11/18/21 1144  ?Vitals shown include unvalidated device data. ? ?Last Pain:  ?Vitals:  ? 11/18/21 1013  ?TempSrc: Oral  ?   ? ?  ? ?Complications: No notable events documented. ?

## 2021-11-18 NOTE — Discharge Instructions (Signed)
AMBULATORY SURGERY  ?DISCHARGE INSTRUCTIONS ? ? ?The drugs that you were given will stay in your system until tomorrow so for the next 24 hours you should not: ? ?Drive an automobile ?Make any legal decisions ?Drink any alcoholic beverage ? ? ?You may resume regular meals tomorrow.  Today it is better to start with liquids and gradually work up to solid foods. ? ?You may eat anything you prefer, but it is better to start with liquids, then soup and crackers, and gradually work up to solid foods. ? ? ?Please notify your doctor immediately if you have any unusual bleeding, trouble breathing, redness and pain at the surgery site, drainage, fever, or pain not relieved by medication. ? ? ? ?Additional Instructions: ? ? ? ?Please contact your physician with any problems or Same Day Surgery at [phone], Monday through Friday 6 am to 4 pm, or [place] [facility] Main number at [phone].  ?

## 2021-11-18 NOTE — Anesthesia Preprocedure Evaluation (Addendum)
Anesthesia Evaluation  ?Patient identified by MRN, date of birth, ID band ?Patient awake ? ? ? ?Reviewed: ?Allergy & Precautions, NPO status , Patient's Chart, lab work & pertinent test results ? ?History of Anesthesia Complications ?(+) PONV and history of anesthetic complications ? ?Airway ?Mallampati: III ? ? ?Neck ROM: Full ? ? ? Dental ?no notable dental hx. ? ?  ?Pulmonary ?former smoker (quit 4 years ago),  ?  ?Pulmonary exam normal ?breath sounds clear to auscultation ? ? ? ? ? ? Cardiovascular ?Exercise Tolerance: Good ?negative cardio ROS ?Normal cardiovascular exam ?Rhythm:Regular Rate:Normal ? ? ?  ?Neuro/Psych ? Neuromuscular disease (multiple sclerosis)   ? GI/Hepatic ?negative GI ROS,   ?Endo/Other  ?negative endocrine ROS ? Renal/GU ?Renal disease (nephrolithiasis)  ? ?  ?Musculoskeletal ? ? Abdominal ?  ?Peds ? Hematology ?negative hematology ROS ?(+)   ?Anesthesia Other Findings ? ? Reproductive/Obstetrics ? ?  ? ? ? ? ? ? ? ? ? ? ? ? ? ?  ?  ? ? ? ? ? ? ? ?Anesthesia Physical ?Anesthesia Plan ? ?ASA: 2 ? ?Anesthesia Plan: General  ? ?Post-op Pain Management:   ? ?Induction: Intravenous ? ?PONV Risk Score and Plan: 3 and Ondansetron, Dexamethasone, Treatment may vary due to age or medical condition, TIVA and Propofol infusion ? ?Airway Management Planned: LMA ? ?Additional Equipment:  ? ?Intra-op Plan:  ? ?Post-operative Plan: Extubation in OR ? ?Informed Consent: I have reviewed the patients History and Physical, chart, labs and discussed the procedure including the risks, benefits and alternatives for the proposed anesthesia with the patient or authorized representative who has indicated his/her understanding and acceptance.  ? ? ? ?Dental advisory given ? ?Plan Discussed with: CRNA ? ?Anesthesia Plan Comments: (Anesthetic considerations for multiple sclerosis: closely monitor body temperature to avoid increase above baseline, avoid succinylcholine, pt may have  altered sensitivity to NDMRs. ? ?Patient consented for risks of anesthesia including but not limited to:  ?- adverse reactions to medications ?- damage to eyes, teeth, lips or other oral mucosa ?- nerve damage due to positioning  ?- sore throat or hoarseness ?- damage to heart, brain, nerves, lungs, other parts of body or loss of life ? ?Informed patient about role of CRNA in peri- and intra-operative care.  Patient voiced understanding.)  ? ? ? ? ?Anesthesia Quick Evaluation ? ?

## 2021-11-18 NOTE — Anesthesia Procedure Notes (Signed)
Procedure Name: LMA Insertion ?Date/Time: 11/18/2021 10:44 AM ?Performed by: Irving Burton, CRNA ?Pre-anesthesia Checklist: Patient identified, Patient being monitored, Timeout performed, Emergency Drugs available and Suction available ?Patient Re-evaluated:Patient Re-evaluated prior to induction ?Oxygen Delivery Method: Circle system utilized ?Preoxygenation: Pre-oxygenation with 100% oxygen ?Induction Type: IV induction ?Ventilation: Mask ventilation without difficulty ?LMA: LMA inserted ?LMA Size: 4.5 ?Tube type: Oral ?Number of attempts: 1 ?Placement Confirmation: positive ETCO2 and breath sounds checked- equal and bilateral ?Tube secured with: Tape ?Dental Injury: Teeth and Oropharynx as per pre-operative assessment  ? ? ? ? ?

## 2021-11-18 NOTE — H&P (Signed)
? ?  11/18/21 ?10:09 AM  ? ?Damon Blair ?10/09/1990 ?[id number] ? ?CC: Left distal ureteral stone ? ?HPI: ?31 year old male who originally presented to the ER on 10/27/2021 with a 3 mm left distal ureteral stone and opted for medical expulsive therapy.  He also was found of a UTI at that time was treated with antibiotics.  I saw him back in clinic on 11/09/2021 and his pain had resolved, urinalysis was benign, and KUB showed no evidence of persistent stone.  He then had recurrence of his pain on 11/16/2021 and presented to the ER and CT showed a persistent 3 mm left distal ureteral stone.  Urinalysis appeared infected and he was treated with antibiotics, and culture ultimately grew E. coli.  As he was afebrile and clinically stable he was discharged on antibiotics with close follow-up for ureteroscopy.  He denies any fevers, chills, or UTI symptoms. ? ? ?PMH: ?Past Medical History:  ?Diagnosis Date  ? MS (multiple sclerosis) (HCC)   ? ? ?Surgical History: ?Past Surgical History:  ?Procedure Laterality Date  ? FRACTURE SURGERY Left   ? ? ? ? ?Family History: ?History reviewed. No pertinent family history. ? ?Social History:  reports that he has never smoked. He has never been exposed to tobacco smoke. He has never used smokeless tobacco. He reports that he does not currently use alcohol. He reports that he does not currently use drugs. ? ?Physical Exam: ?Constitutional:  Alert and oriented, No acute distress. ?Cardiovascular: Regular rate and rhythm ?Respiratory: Clear to auscultation bilaterally ?GI: Abdomen is soft, nontender, nondistended, no abdominal masses ? ? ?Laboratory Data: ?Reviewed, culture 11/15/2021 with E. coli, treated with Bactrim ? ?Assessment & Plan:   ?31 year old male with 3 mm left distal ureteral stone who has failed a trial of medical expulsive therapy, and opted for left ureteroscopy and laser lithotripsy.  Is been on culture appropriate antibiotics. ? ?We specifically discussed the risks  ureteroscopy including bleeding, infection/sepsis, stent related symptoms including flank pain/urgency/frequency/incontinence/dysuria, ureteral injury, inability to access stone, or need for staged or additional procedures. ? ?Left ureteroscopy, laser lithotripsy, stent placement today ? ? ?Legrand Rams, MD ?11/18/2021 ? ?[facility] ?[address], [address] ?[place], [place] [address] ?[phone] ? ?

## 2021-11-20 ENCOUNTER — Encounter: Payer: Self-pay | Admitting: Emergency Medicine

## 2021-11-20 ENCOUNTER — Other Ambulatory Visit: Payer: Self-pay

## 2021-11-20 ENCOUNTER — Emergency Department: Payer: Managed Care, Other (non HMO)

## 2021-11-20 ENCOUNTER — Emergency Department
Admission: EM | Admit: 2021-11-20 | Discharge: 2021-11-20 | Disposition: A | Discharge status: Home / Self Care | Payer: Managed Care, Other (non HMO) | Attending: Student in an Organized Health Care Education/Training Program | Admitting: Student in an Organized Health Care Education/Training Program

## 2021-11-20 ENCOUNTER — Ambulatory Visit (INDEPENDENT_AMBULATORY_CARE_PROVIDER_SITE_OTHER)
Admission: EM | Admit: 2021-11-20 | Discharge: 2021-11-20 | Disposition: A | Discharge status: Other Acute Inpatient Hospital | Payer: Managed Care, Other (non HMO) | Source: Home / Self Care

## 2021-11-20 DIAGNOSIS — R110 Nausea: Secondary | ICD-10-CM | POA: Diagnosis not present

## 2021-11-20 DIAGNOSIS — R109 Unspecified abdominal pain: Secondary | ICD-10-CM | POA: Insufficient documentation

## 2021-11-20 DIAGNOSIS — N50819 Testicular pain, unspecified: Secondary | ICD-10-CM | POA: Diagnosis not present

## 2021-11-20 LAB — URINALYSIS, ROUTINE W REFLEX MICROSCOPIC
Bilirubin Urine: NEGATIVE
Bilirubin Urine: NEGATIVE
Glucose, UA: NEGATIVE mg/dL
Glucose, UA: NEGATIVE mg/dL
Ketones, ur: NEGATIVE mg/dL
Ketones, ur: NEGATIVE mg/dL
Leukocytes,Ua: NEGATIVE
Nitrite: NEGATIVE
Nitrite: NEGATIVE
Protein, ur: NEGATIVE mg/dL
Protein, ur: NEGATIVE mg/dL
Specific Gravity, Urine: <1.005 — ABNORMAL LOW (ref 1.005–1.030)
Specific Gravity, Urine: 1.001 — ABNORMAL LOW (ref 1.005–1.030)
Squamous Epithelial / HPF: NONE SEEN (ref 0–5)
pH: 6.5 (ref 5.0–8.0)
pH: 7 (ref 5.0–8.0)

## 2021-11-20 LAB — CBC
HCT: 32.6 % — ABNORMAL LOW (ref 39.0–52.0)
Hemoglobin: 10.8 g/dL — ABNORMAL LOW (ref 13.0–17.0)
MCH: 28.3 pg (ref 26.0–34.0)
MCHC: 33.1 g/dL (ref 30.0–36.0)
MCV: 85.6 fL (ref 80.0–100.0)
Platelets: 177 10*3/uL (ref 150–400)
RBC: 3.81 MIL/uL — ABNORMAL LOW (ref 4.22–5.81)
RDW: 12.6 % (ref 11.5–15.5)
WBC: 8.7 10*3/uL (ref 4.0–10.5)
nRBC: 0 % (ref 0.0–0.2)

## 2021-11-20 LAB — BASIC METABOLIC PANEL
Anion gap: 7 (ref 5–15)
BUN: 12 mg/dL (ref 6–20)
CO2: 29 mmol/L (ref 22–32)
Calcium: 8.7 mg/dL — ABNORMAL LOW (ref 8.9–10.3)
Chloride: 102 mmol/L (ref 98–111)
Creatinine, Ser: 1.23 mg/dL (ref 0.61–1.24)
GFR, Estimated: >60 mL/min (ref >60)
Glucose, Bld: 97 mg/dL (ref 70–99)
Potassium: 3.6 mmol/L (ref 3.5–5.1)
Sodium: 138 mmol/L (ref 135–145)

## 2021-11-20 LAB — URINALYSIS, MICROSCOPIC (REFLEX): WBC, UA: >50 WBC/hpf (ref 0–5)

## 2021-11-20 MED ORDER — KETOROLAC TROMETHAMINE 30 MG/ML IJ SOLN
15.0000 mg | Freq: Once | INTRAMUSCULAR | Status: AC
Start: 2021-11-20 — End: 2021-11-20
  Administered 2021-11-20: 15 mg via INTRAVENOUS
  Filled 2021-11-20: qty 1

## 2021-11-20 MED ORDER — ONDANSETRON HCL 4 MG/2ML IJ SOLN
4.0000 mg | Freq: Once | INTRAMUSCULAR | Status: AC
  Administered 2021-11-20: 4 mg via INTRAVENOUS
  Filled 2021-11-20: qty 2

## 2021-11-20 MED ORDER — OXYBUTYNIN CHLORIDE 5 MG PO TABS: 5.0000 mg | ORAL_TABLET | Freq: Three times a day (TID) | ORAL | 0 refills | Status: DC | PRN

## 2021-11-20 MED ORDER — OXYCODONE-ACETAMINOPHEN 5-325 MG PO TABS
1.0000 | ORAL_TABLET | Freq: Once | ORAL | Status: AC
  Administered 2021-11-20: 1 via ORAL
  Filled 2021-11-20: qty 1

## 2021-11-20 MED ORDER — CEPHALEXIN 500 MG PO CAPS: 500.0000 mg | ORAL_CAPSULE | Freq: Three times a day (TID) | ORAL | 0 refills | Status: AC

## 2021-11-20 MED ORDER — MORPHINE SULFATE (PF) 4 MG/ML IV SOLN
4.0000 mg | INTRAVENOUS | Status: DC | PRN
  Administered 2021-11-20: 4 mg via INTRAVENOUS
  Filled 2021-11-20: qty 1

## 2021-11-20 MED ORDER — OXYBUTYNIN CHLORIDE 5 MG PO TABS
5.0000 mg | ORAL_TABLET | Freq: Three times a day (TID) | ORAL | Status: DC
  Administered 2021-11-20: 5 mg via ORAL
  Filled 2021-11-20 (×2): qty 1

## 2021-11-20 NOTE — ED Notes (Signed)
Patient taken to imaging. 

## 2021-11-20 NOTE — ED Triage Notes (Signed)
Pt via POV from home. Pt had a surgery for kidney stone was on Friday and had a stent placed on his L side, pt was suppose to removed stent on Monday was instructed to removed stent if there was pain. Pt states he removed the stent this AM at 0300 and since then pt c/o NV. Pt c/o L testicle pain and LLQ pain. Denies any testicle swelling. Pt is A&Ox4 and NAD ?

## 2021-11-20 NOTE — ED Provider Notes (Signed)
?[facility] ? ? ? ?CSN: [id number] ?Arrival date & time: 11/20/21  1327 ? ? ?  ? ?History   ?Chief Complaint ?Chief Complaint  ?Patient presents with  ? Abdominal Pain  ? Testicle Pain  ? ? ?HPI ?Kelli Egolf is a 31 y.o. male.  ? ?HPI ? ?31 year old male here for evaluation of left leg pain. ? ?Patient presents for evaluation of significant left flank pain with radiation into his left testicle that started today.  He underwent surgery 2 days ago for the removal of this kidney stone and had a stent placed in his left ureter.  The instructions in epic were for the patient to remove the stent at home tomorrow 11/21/2021.  Patient removed the stent himself last night at home because he was unable to bear the pain of the stent.  The discharge instructions further state that if pain was not controlled by Toradol or anti-inflammatories that he was to call [facility] urology or present to the emergency department, neither of which she did.  Patient also reports that he is having difficulty urinating.  He had significant nausea and vomiting after removing the stent but is not having any at present. ? ?Past Medical History:  ?Diagnosis Date  ? MS (multiple sclerosis) (HCC)   ? ? ?Patient Active Problem List  ? Diagnosis Date Noted  ? Hydronephrosis with urinary obstruction due to ureteral calculus 11/15/2021  ? Pyelonephritis of left kidney 11/15/2021  ? Acute pyelonephritis 11/15/2021  ? Heroin abuse (HCC) 12/10/2020  ? Elevated blood pressure reading 05/27/2020  ? Urinary incontinence 11/08/2019  ? History of hepatitis C 10/08/2019  ? Spasticity 01/01/2019  ? Multiple sclerosis (HCC) 12/29/2018  ? Anxiety 05/31/2018  ? 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 ADHD (attention deficit hyperactivity disorder), combined type 12/22/2015  ? Continuous cannabis use 12/22/2015  ? Acute upper respiratory infection 03/01/2015  ? Need for diphtheria-tetanus-pertussis (Tdap) vaccine 03/01/2015  ? Open wound of  left hand 03/01/2015  ? Acute pharyngitis 03/01/2015  ? ? ?Past Surgical History:  ?Procedure Laterality Date  ? CYSTOSCOPY W/ RETROGRADES  11/18/2021  ? Procedure: CYSTOSCOPY WITH RETROGRADE PYELOGRAM;  Surgeon: Sondra Come, MD;  Location: [facility] ORS;  Service: Urology;;  ? CYSTOSCOPY/URETEROSCOPY/HOLMIUM LASER/STENT PLACEMENT Left 11/18/2021  ? Procedure: CYSTOSCOPY/URETEROSCOPY/HOLMIUM LASER/STENT PLACEMENT;  Surgeon: Sondra Come, MD;  Location: [facility] ORS;  Service: Urology;  Laterality: Left;  ? FRACTURE SURGERY Left   ? ? ? ? ? ?Home Medications   ? ?Prior to Admission medications   ?Medication Sig Start Date End Date Taking? Authorizing Provider  ?baclofen (LIORESAL) 20 MG tablet Take 20 mg by mouth 3 (three) times daily.   Yes [provider]  ?diazepam (VALIUM) 5 MG tablet Take 5 mg by mouth as needed for anxiety.   Yes [provider]  ?dronabinol (MARINOL) 5 MG capsule Take 5 mg by mouth 3 (three) times daily. 09/08/20  Yes [provider]  ?Glatiramer Acetate 40 MG/ML SOSY Inject into the skin. 07/23/20  Yes [provider]  ?oxyCODONE-acetaminophen (PERCOCET/ROXICET) 5-325 MG tablet Take 1 tablet by mouth every 6 (six) hours as needed for severe pain. 11/16/21  Yes Swayze, Ava, DO  ?pregabalin (LYRICA) 200 MG capsule Take 200 mg by mouth in the morning, at noon, and at bedtime.   Yes [provider]  ?tamsulosin (FLOMAX) 0.4 MG CAPS capsule Take 1 capsule (0.4 mg total) by mouth daily. 10/27/21  Yes Sharyn Creamer, MD  ?amphetamine-dextroamphetamine (ADDERALL) 5  MG tablet Take 5 mg by mouth daily. 08/30/20   [provider]  ?cephALEXin (KEFLEX) 500 MG capsule Take 2 capsules (1,000 mg total) by mouth 4 (four) times daily for 10 days. 11/16/21 11/26/21  Swayze, Ava, DO  ?cetirizine (ZYRTEC) 10 MG tablet Take 10 mg by mouth as needed.    [provider]  ?imiquimod Mathis Dad) 5 % cream SMARTSIG:1 Packet(s) Topical 3 Times a Week 10/24/21   [provider]  ?ketorolac (TORADOL) 10 MG tablet Take 1 tablet (10 mg total) by mouth every 6 (six) hours as needed. 11/18/21   Sondra Come, MD  ?ondansetron (ZOFRAN) 4 MG tablet Take 1 tablet (4 mg total) by mouth every 6 (six) hours as needed for nausea. 11/16/21   Swayze, Ava, DO  ? ? ?Family History ?No family history on file. ? ?Social History ?Social History  ? ?Tobacco Use  ? Smoking status: Never  ?  Passive exposure: Never  ? Smokeless tobacco: Never  ?Vaping Use  ? Vaping Use: Never used  ?Substance Use Topics  ? Alcohol use: Not Currently  ? Drug use: Not Currently  ? ? ? ?Allergies   ?Clavulanic acid ? ? ?Review of Systems ?Review of Systems  ?Constitutional:  Negative for fever.  ?Gastrointestinal:  Positive for abdominal pain, nausea and vomiting.  ?Genitourinary:  Positive for difficulty urinating, flank pain and testicular pain.  ?Hematological: Negative.   ?Psychiatric/Behavioral: Negative.    ? ? ?Physical Exam ?Triage Vital Signs ?ED Triage Vitals  ?Enc Vitals Group  ?   BP 11/20/21 1344 138/73  ?   Pulse Rate 11/20/21 1344 96  ?   Resp 11/20/21 1344 18  ?   Temp 11/20/21 1344 98 ?F (36.7 ?C)  ?   Temp Source 11/20/21 1344 Oral  ?   SpO2 11/20/21 1344 98 %  ?   Weight 11/20/21 1340 195 lb (88.5 kg)  ?   Height 11/20/21 1340 5\' 9"  (1.753 m)  ?   Head Circumference --   ?   Peak Flow --   ?   Pain Score 11/20/21 1334 7  ?   Pain Loc --   ?   Pain Edu? --   ?   Excl. in [place]? --   ? ?No data found. ? ?Updated Vital Signs ?BP 138/73 (BP Location: Left Arm)   Pulse 96   Temp 98 ?F (36.7 ?C) (Oral)   Resp 18   Ht 5\' 9"  (1.753 m)   Wt 195 lb (88.5 kg)   SpO2 98%   BMI 28.80 kg/m?  ? ?Visual Acuity ?Right Eye Distance:   ?Left Eye Distance:   ?Bilateral Distance:   ? ?Right Eye Near:   ?Left Eye Near:    ?Bilateral Near:    ? ?Physical Exam ?Vitals and nursing note reviewed.  ?Constitutional:   ?   General: He is in acute distress.  ?   Appearance: He is ill-appearing.  ?HENT:  ?   Head:  Normocephalic and atraumatic.  ?Skin: ?   General: Skin is warm and dry.  ?   Capillary Refill: Capillary refill takes less than 2 seconds.  ?   Findings: No erythema or rash.  ?Neurological:  ?   General: No focal deficit present.  ?   Mental Status: He is alert and oriented to person, place, and time.  ?Psychiatric:     ?   Mood and Affect: Mood normal.     ?  Behavior: Behavior normal.     ?   Thought Content: Thought content normal.     ?   Judgment: Judgment normal.  ? ? ? ?UC Treatments / Results  ?Labs ?(all labs ordered are listed, but only abnormal results are displayed) ?Labs Reviewed  ?URINALYSIS, ROUTINE W REFLEX MICROSCOPIC - Abnormal; Notable for the following components:  ?    Result Value  ? Specific Gravity, Urine <1.005 (*)   ? Hgb urine dipstick MODERATE (*)   ? Leukocytes,Ua MODERATE (*)   ? All other components within normal limits  ?URINALYSIS, MICROSCOPIC (REFLEX) - Abnormal; Notable for the following components:  ? Bacteria, UA FEW (*)   ? All other components within normal limits  ? ? ?EKG ? ? ?Radiology ?No results found. ? ?Procedures ?Procedures (including critical care time) ? ?Medications Ordered in UC ?Medications - No data to display ? ?Initial Impression / Assessment and Plan / UC Course  ?I have reviewed the triage vital signs and the nursing notes. ? ?Pertinent labs & imaging results that were available during my care of the patient were reviewed by me and considered in my medical decision making (see chart for details). ? ?Patient is a 31 year old male who appears to be in a moderate degree of pain and is ill-appearing who presents for evaluation of left flank pain radiating into his left testicle after removing his own ureteral stent last night at home following kidney stone surgery 2 days ago.  Patient is not febrile in clinic but he is having significant discomfort.  I have advised the patient that since he has fresh postop, having significant pain that I am unable to control,  and having pain in the left flank that he needs to go to the emergency department for evaluation.  Urinalysis was collected at triage which shows moderate hemoglobin, moderate leukocyte Estrace, greater than 50 WBCs, and WB

## 2021-11-20 NOTE — ED Triage Notes (Signed)
Patient is here for "had surgery on Friday to have a kidney stone removed, stint placed, pain last night was unbearable". Removed stint at home last night, nausea and vomiting after removing. "Now I have pain in my left flank and testicle pain and difficulty urinating". No fever.  ?

## 2021-11-20 NOTE — ED Notes (Signed)
Patient is being discharged from the Urgent Care and sent to the [facility] Emergency Department via private vehicle . Per Becky Augusta, NP, patient is in need of higher level of care due to post surgical infection . Patient is aware and verbalizes understanding of plan of care.  ?Vitals:  ? 11/20/21 1344  ?BP: 138/73  ?Pulse: 96  ?Resp: 18  ?Temp: 98 ?F (36.7 ?C)  ?SpO2: 98%  ?  ?

## 2021-11-20 NOTE — Discharge Instructions (Addendum)
Given the degree of your flank pain, your urinalysis results, and the fact that she removed your stent early following kidney stone removal I believe you would be best served to be evaluated in the emergency department as you need imaging of your abdomen to ensure that there is no damage to the ureter as well as antibiotic therapy for your infected urine.  Please go to [facility] ER now. ?

## 2021-11-20 NOTE — ED Notes (Signed)
Pt states he has surgery Friday for a kidney stone and now has increased pain in his groin area ?

## 2021-11-20 NOTE — Discharge Instructions (Signed)
Sent additional prescription for antibiotics to your pharmacy to cover you for 2 more days but please reach out to doctor that prescribed the initial antibiotic to see if they want you to be on it longer.  Please follow-up with PCP neurology as needed.  return to the ER for any fevers worsening pain or additional concerns or questions. ?

## 2021-11-20 NOTE — ED Provider Notes (Signed)
? ?[facility] ?Provider Note ? ? ? Event Date/Time  ? First MD Initiated Contact with Patient 11/20/21 1614   ?  (approximate) ? ? ?History  ? ?Testicle Pain and Abdominal Pain ? ? ?HPI ? ?Damon Blair is a 31 y.o. male status post recent ureteroscopy and stent placement presents to the ER for left flank pain radiating to his groin after the patient pulled out his ureteral stent early in the morning.  Is having nausea associated with pain similar to pain related to his stone.  No fevers or chills no chest pain or shortness of breath. ?  ? ? ?Physical Exam  ? ?Triage Vital Signs: ?ED Triage Vitals [11/20/21 1459]  ?Enc Vitals Group  ?   BP 127/77  ?   Pulse Rate 91  ?   Resp 20  ?   Temp 98.2 ?F (36.8 ?C)  ?   Temp Source Oral  ?   SpO2 95 %  ?   Weight   ?   Height   ?   Head Circumference   ?   Peak Flow   ?   Pain Score 10  ?   Pain Loc   ?   Pain Edu?   ?   Excl. in [place]?   ? ? ?Most recent vital signs: ?Vitals:  ? 11/20/21 1459 11/20/21 1755  ?BP: 127/77 122/74  ?Pulse: 91 81  ?Resp: 20 18  ?Temp: 98.2 ?F (36.8 ?C)   ?SpO2: 95% 97%  ? ? ? ?Constitutional: Alert  ?Eyes: Conjunctivae are normal.  ?Head: Atraumatic. ?Nose: No congestion/rhinnorhea. ?Mouth/Throat: Mucous membranes are moist.   ?Neck: Painless ROM.  ?Cardiovascular:   Good peripheral circulation. ?Respiratory: Normal respiratory effort.  No retractions.  ?Gastrointestinal: Soft and nontender. No guarding ?GU: Scrotum is benign.  Cremasteric reflex present bilaterally.  No testicular tenderness to palpation. ?Musculoskeletal:  no deformity ?Neurologic:  MAE spontaneously. No gross focal neurologic deficits are appreciated.  ?Skin:  Skin is warm, dry and intact. No rash noted. ?Psychiatric: Mood and affect are normal. Speech and behavior are normal. ? ? ? ?ED Results / Procedures / Treatments  ? ?Labs ?(all labs ordered are listed, but only abnormal results are displayed) ?Labs Reviewed  ?URINALYSIS, ROUTINE W REFLEX  MICROSCOPIC - Abnormal; Notable for the following components:  ?    Result Value  ? Color, Urine COLORLESS (*)   ? APPearance CLEAR (*)   ? Specific Gravity, Urine 1.001 (*)   ? Hgb urine dipstick MODERATE (*)   ? Bacteria, UA RARE (*)   ? All other components within normal limits  ?BASIC METABOLIC PANEL - Abnormal; Notable for the following components:  ? Calcium 8.7 (*)   ? All other components within normal limits  ?CBC - Abnormal; Notable for the following components:  ? RBC 3.81 (*)   ? Hemoglobin 10.8 (*)   ? HCT 32.6 (*)   ? All other components within normal limits  ? ? ? ?EKG ? ? ? ? ?RADIOLOGY ? ?Please see ED Course for my review and interpretation. ? ?I personally reviewed all radiographic images ordered to evaluate for the above acute complaints and reviewed radiology reports and findings.  These findings were personally discussed with the patient.  Please see medical record for radiology report. ? ? ?PROCEDURES: ? ?Critical Care performed:  ? ?Procedures ? ? ?MEDICATIONS ORDERED IN ED: ?Medications  ?morphine (PF) 4 MG/ML injection 4 mg (4 mg Intravenous Given 11/20/21 1650)  ?  oxybutynin (DITROPAN) tablet 5 mg (5 mg Oral Given 11/20/21 1755)  ?ketorolac (TORADOL) 30 MG/ML injection 15 mg (15 mg Intravenous Given 11/20/21 1652)  ?ondansetron [facility]) injection 4 mg (4 mg Intravenous Given 11/20/21 1648)  ?oxyCODONE-acetaminophen (PERCOCET/ROXICET) 5-325 MG per tablet 1 tablet (1 tablet Oral Given 11/20/21 1819)  ? ? ? ?IMPRESSION / MDM / ASSESSMENT AND PLAN / ED COURSE  ?I reviewed the triage vital signs and the nursing notes. ?             ?               ? ?Differential diagnosis includes, but is not limited to, stone, ureteral spasm, torsion, epididymitis, UTI, stent displacement ? ?Patient presented to the ER for evaluation of symptoms as described above.  He is nontoxic-appearing in no acute distress.  Is complaining of pain consistent with flank pain and recent stent placement I suspect ureteral spasm.   Will give pain medication IV Toradol as well as oxybutynin.  His urinalysis is without any evidence of infection.  No white count renal function normal.  Scrotal exam is benign not consistent with torsion. ? ? ?Clinical Course as of 11/20/21 1824  ?Sun Nov 20, 2021  ?1722 Blood work is reassuring.  Not consistent with UTI or pyelonephritis. [PR]  ?1722 X-ray on my review and interpretation without evidence of retained stent or stone.  Suspect his symptoms are related to ureteral spasm I have ordered oxybutynin. [PR]  ?1823 Patient reassessed improvement in symptoms.  Does appear stable and appropriate for outpatient follow-up. [PR]  ?  ?Clinical Course User Index ?[PR] Merlyn Lot, MD  ? ? ? ?FINAL CLINICAL IMPRESSION(S) / ED DIAGNOSES  ? ?Final diagnoses:  ?Flank pain  ? ? ? ?Rx / DC Orders  ? ?ED Discharge Orders   ? ?      Ordered  ?  cephALEXin (KEFLEX) 500 MG capsule  3 times daily       ? 11/20/21 1819  ?  oxybutynin (DITROPAN) 5 MG tablet  Every 8 hours PRN       ? 11/20/21 1819  ? ?  ?  ? ?  ? ? ? ?Note:  This document was prepared using Dragon voice recognition software and may include unintentional dictation errors. ? ?  ?Merlyn Lot, MD ?11/20/21 1824 ? ?

## 2021-11-21 ENCOUNTER — Telehealth: Payer: Self-pay

## 2021-11-21 NOTE — Telephone Encounter (Signed)
Spoke with pt. Pt. Advised to finish course.  ?

## 2021-11-21 NOTE — Telephone Encounter (Signed)
Patient called in this morning and reports that he went to the ED yesterday secondary to pain and also pulled his stent out yesterday am. States that ED physician was concerned that he did not receive enough Keflex when initially prescribed in the ER. Patient states that Rx was written for 2 tabs 4 times daily for 10 days with a quanity of 40, should be 80. He wanted to know if he needs to continue Keflex? Looks like ED gave him 500mg  1 TID x 3 days. States that he is still in pain but hasn't picked up Oxybutnin yet, told him this should help with his pain.  ?

## 2021-12-24 ENCOUNTER — Encounter: Payer: Self-pay | Admitting: Intensive Care

## 2021-12-24 ENCOUNTER — Emergency Department: Payer: Managed Care, Other (non HMO)

## 2021-12-24 ENCOUNTER — Emergency Department
Admission: EM | Admit: 2021-12-24 | Discharge: 2021-12-24 | Disposition: A | Discharge status: Home / Self Care | Payer: Managed Care, Other (non HMO) | Attending: Emergency Medicine | Admitting: Emergency Medicine

## 2021-12-24 ENCOUNTER — Other Ambulatory Visit: Payer: Self-pay

## 2021-12-24 DIAGNOSIS — R0989 Other specified symptoms and signs involving the circulatory and respiratory systems: Secondary | ICD-10-CM | POA: Diagnosis not present

## 2021-12-24 DIAGNOSIS — R059 Cough, unspecified: Secondary | ICD-10-CM | POA: Diagnosis not present

## 2021-12-24 IMAGING — CR DG CHEST 2V
2 series · 2 of 2 positions shown · non-contrast
Comparison: Chest x-ray [DATE]

CLINICAL DATA: Coughing

EXAM:
CHEST - 2 VIEW

[chest pa]
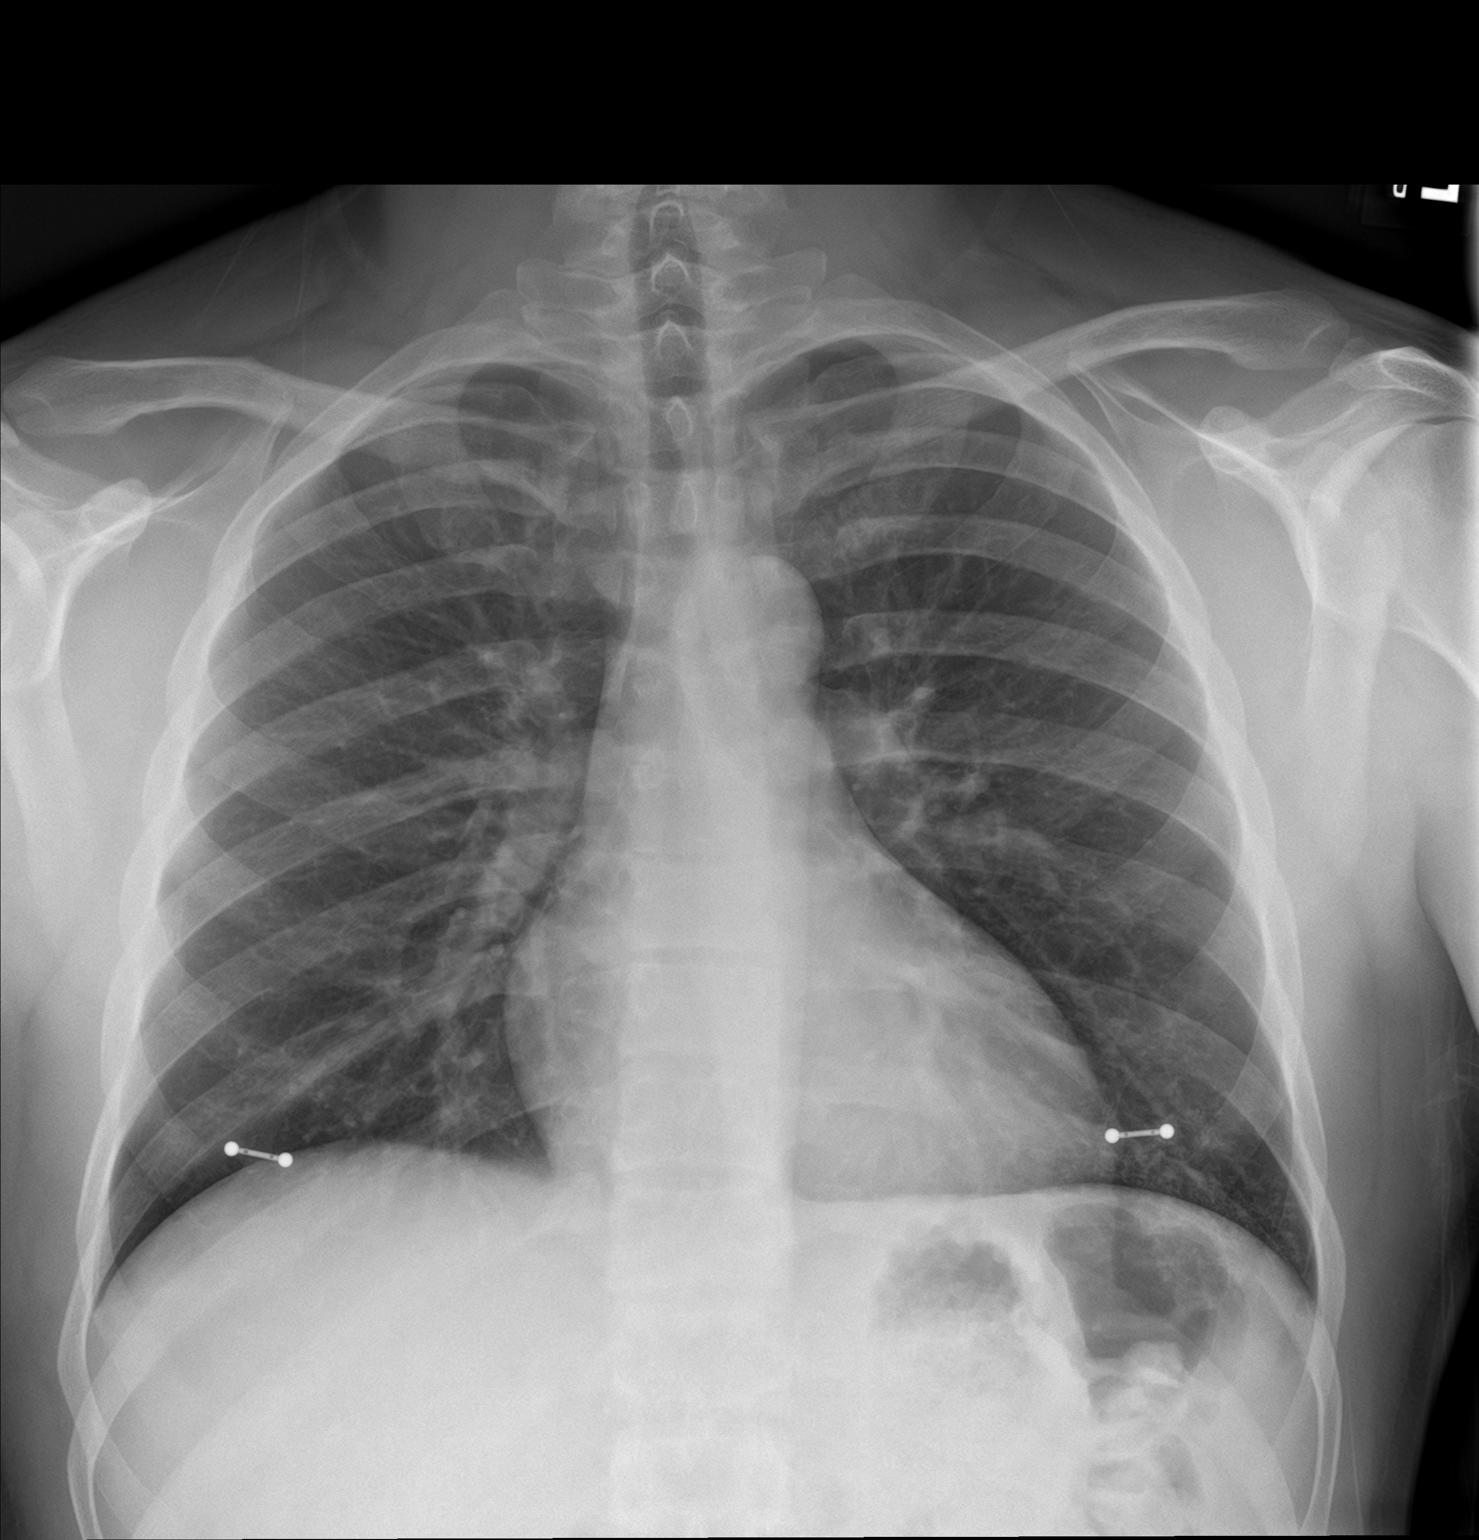

[chest lat]
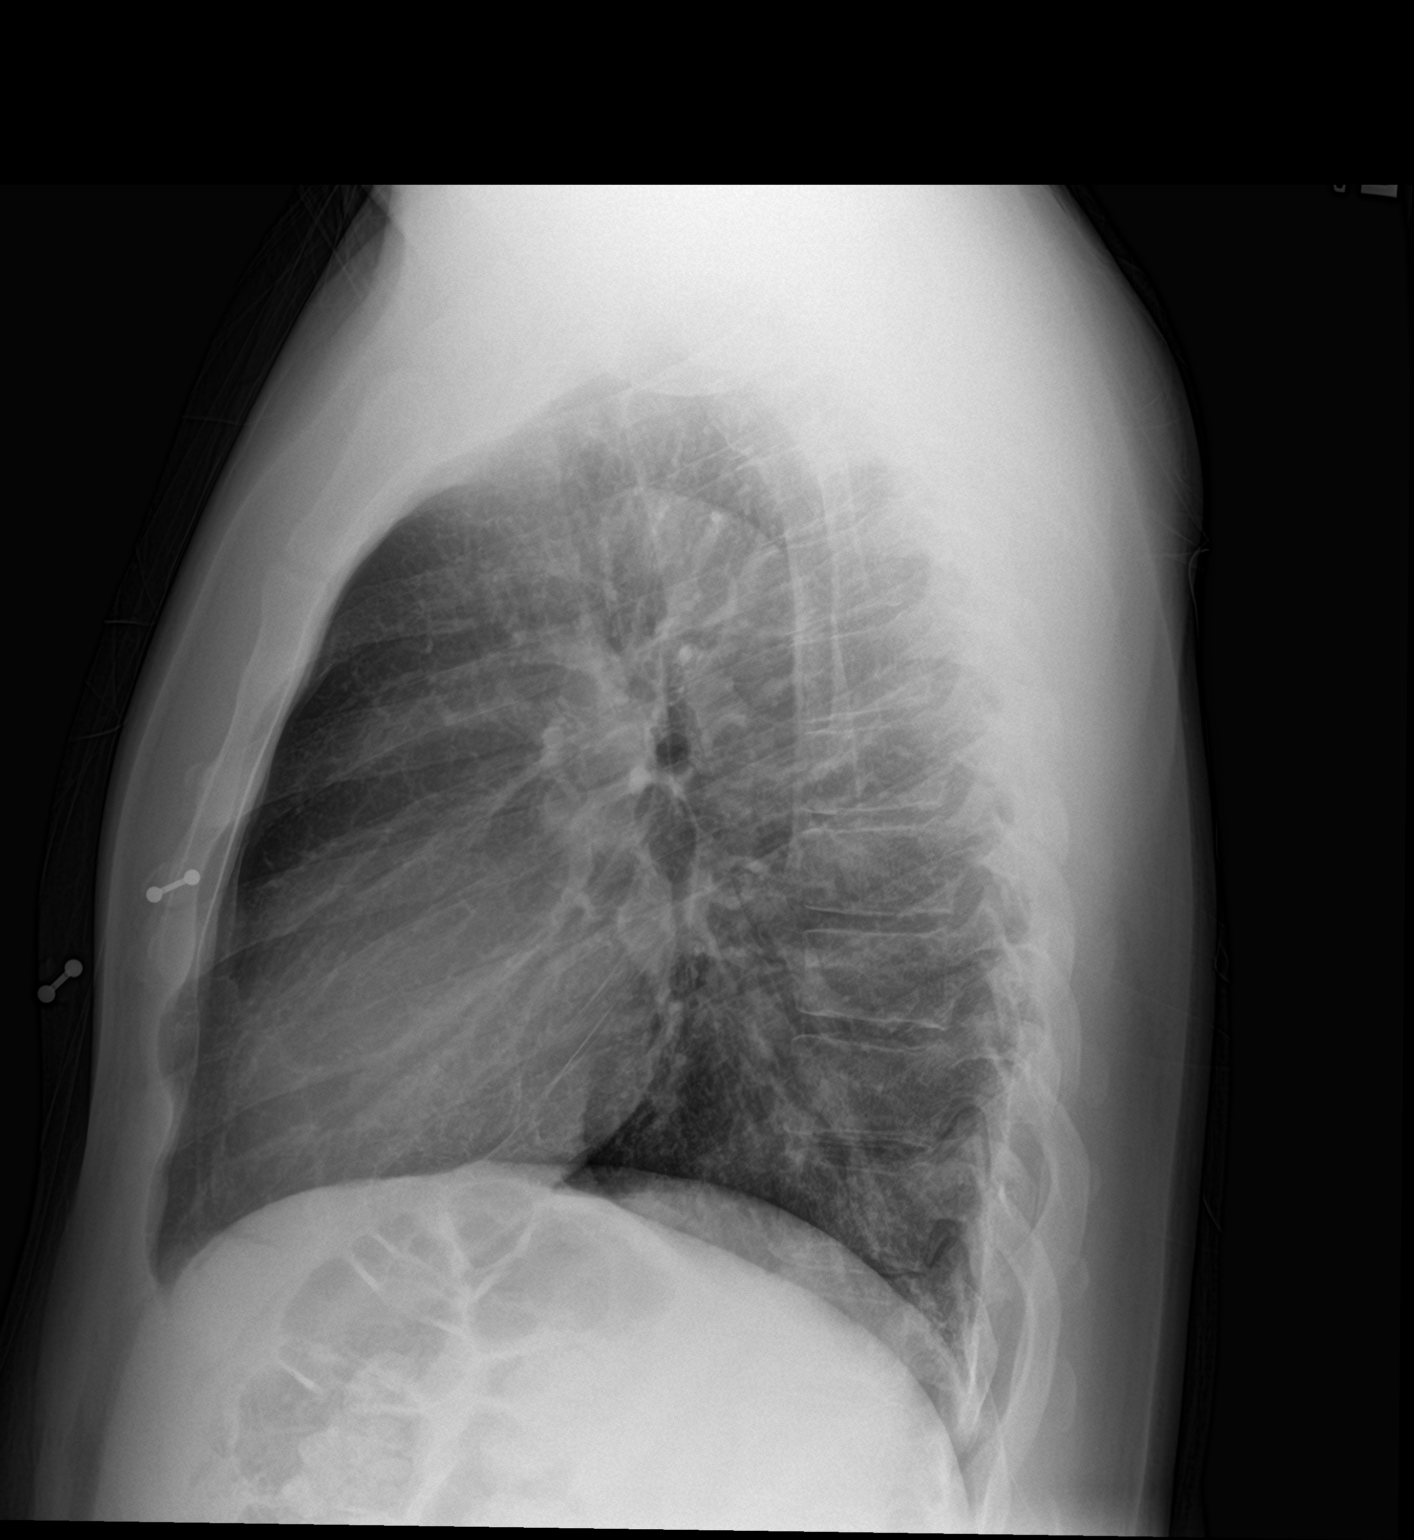

[2 of 2 positions shown; findings below may reference images not displayed]

FINDINGS: Heart size and mediastinal contours are within normal limits. No
suspicious pulmonary opacities identified.

No pleural effusion or pneumothorax visualized.

No acute osseous abnormality appreciated.
IMPRESSION: No acute intrathoracic process identified.

## 2021-12-24 MED ORDER — ALUM & MAG HYDROXIDE-SIMETH 200-200-20 MG/5ML PO SUSP
30.0000 mL | Freq: Once | ORAL | Status: AC
  Administered 2021-12-24: 30 mL via ORAL
  Filled 2021-12-24: qty 30

## 2021-12-24 MED ORDER — LIDOCAINE VISCOUS HCL 2 % MT SOLN
15.0000 mL | Freq: Once | OROMUCOSAL | Status: AC
  Administered 2021-12-24: 15 mL via ORAL
  Filled 2021-12-24: qty 15

## 2021-12-24 NOTE — ED Notes (Signed)
Pt denies nausea at this time, states that he vomited 2x approx 1700 today.  States that he still feels that there is something stuck ion his throat and believes it is part of the egg [employer] b/c his "burps smell like eggs."  Pt states that he has been able to eat and drink today without complication.  States that he also feels like left side nostril is partially blocked as well.

## 2021-12-24 NOTE — ED Provider Notes (Signed)
[facility] Provider Note    Event Date/Time   First MD Initiated Contact with Patient 12/24/21 1902     (approximate)  History   Chief Complaint: Foreign Body  HPI  Damon Blair is a 31 y.o. male presents to the emergency department today for a choking episode this morning and then continued coughing this afternoon.  According to the patient this morning while eating breakfast he feels like he choked on the part of an Egg McMuffin.  He states after that episode he was coughing for approximately 5 minutes before he felt like he got all the food out of his lungs.  Patient states this afternoon he began coughing again and was concerned so he came to the emergency department.  He states several times when he coughed he was able to expel some sputum and one time he had blood streaks in it.  Physical Exam   Triage Vital Signs: ED Triage Vitals  Enc Vitals Group     BP 12/24/21 1741 132/65     Pulse Rate 12/24/21 1741 85     Resp 12/24/21 1741 20     Temp 12/24/21 1741 98.4 F (36.9 C)     Temp Source 12/24/21 1741 Oral     SpO2 12/24/21 1741 95 %     Weight 12/24/21 1742 198 lb (89.8 kg)     Height 12/24/21 1742 5\' 9"  (1.753 m)     Head Circumference --      Peak Flow --      Pain Score 12/24/21 1741 0     Pain Loc --      Pain Edu? --      Excl. in [place]? --     Most recent vital signs: Vitals:   12/24/21 1741  BP: 132/65  Pulse: 85  Resp: 20  Temp: 98.4 F (36.9 C)  SpO2: 95%    General: Awake, no distress.  CV:  Good peripheral perfusion.  Regular rate and rhythm  Resp:  Normal effort.  Equal breath sounds bilaterally.  No wheeze rales or rhonchi Abd:  No distention.  Soft, nontender.  No rebound or guarding.   ED Results / Procedures / Treatments   MEDICATIONS ORDERED IN ED: Medications  alum & mag hydroxide-simeth (MAALOX/MYLANTA) 200-200-20 MG/5ML suspension 30 mL (has no administration in time range)    And  lidocaine  (XYLOCAINE) 2 % viscous mouth solution 15 mL (has no administration in time range)     IMPRESSION / MDM / ASSESSMENT AND PLAN / ED COURSE  I reviewed the triage vital signs and the nursing notes.  Patient's presentation is most consistent with acute presentation with potential threat to life or bodily function.  Patient presents to the emergency department for coughing/choking episode this morning.  Patient states he was feeling better after coughing for several minutes this morning when he choked on an Egg McMuffin however now he feels like he is coughing again and is worried something could be stuck in his airway.  Patient speaks without difficulty as well as secretions without difficulty.  Patient states he was trying to cough up anything left in his lungs and he did cough up a bit of sputum that had some blood streaked in it.  The blood streaking is very likely from pharyngeal irritation from the amount of coughing the patient had done.  Pharyngeal irritation could also be causing a foreign body sensation.  We will have the patient gargle and swallow a GI  cocktail.  We will obtain a chest x-ray and continue to closely monitor.  Overall the patient appears well, reassuring vitals.  I have reviewed the patient's chest x-ray images, no obvious pneumonia seen on my interpretation. Radiology is read the chest x-ray is negative.  Patient is feeling better after GI cocktail and negative chest x-ray.  We will discharge patient home.  Provided return precautions.  FINAL CLINICAL IMPRESSION(S) / ED DIAGNOSES   Coughing   Note:  This document was prepared using Dragon voice recognition software and may include unintentional dictation errors.   Harvest Dark, MD 12/24/21 2100

## 2021-12-24 NOTE — ED Triage Notes (Signed)
Patient reports feeling like a piece of food is stuck in his throat. Reports this morning while eating a egg mcmuffin he felt like food got stuck and then was able to get it down and felt fine up until 5pm again when he started having a coughing attack and had 1 episode of emesis. Reports some blood in emesis and keeps clearing throat with the feeling of something is stuck.

## 2021-12-25 ENCOUNTER — Emergency Department: Payer: Managed Care, Other (non HMO)

## 2021-12-25 ENCOUNTER — Emergency Department
Admission: EM | Admit: 2021-12-25 | Discharge: 2021-12-25 | Disposition: A | Discharge status: Home / Self Care | Payer: Managed Care, Other (non HMO) | Attending: Emergency Medicine | Admitting: Emergency Medicine

## 2021-12-25 ENCOUNTER — Encounter: Payer: Self-pay | Admitting: Emergency Medicine

## 2021-12-25 DIAGNOSIS — K529 Noninfective gastroenteritis and colitis, unspecified: Secondary | ICD-10-CM | POA: Insufficient documentation

## 2021-12-25 DIAGNOSIS — D649 Anemia, unspecified: Secondary | ICD-10-CM | POA: Diagnosis not present

## 2021-12-25 DIAGNOSIS — R1032 Left lower quadrant pain: Secondary | ICD-10-CM | POA: Diagnosis present

## 2021-12-25 LAB — COMPREHENSIVE METABOLIC PANEL
ALT: 19 U/L (ref 0–44)
AST: 25 U/L (ref 15–41)
Albumin: 4.4 g/dL (ref 3.5–5.0)
Alkaline Phosphatase: 52 U/L (ref 38–126)
Anion gap: 5 (ref 5–15)
BUN: 11 mg/dL (ref 6–20)
CO2: 27 mmol/L (ref 22–32)
Calcium: 9.3 mg/dL (ref 8.9–10.3)
Chloride: 109 mmol/L (ref 98–111)
Creatinine, Ser: 0.74 mg/dL (ref 0.61–1.24)
GFR, Estimated: >60 mL/min (ref >60)
Glucose, Bld: 132 mg/dL — ABNORMAL HIGH (ref 70–99)
Potassium: 4.1 mmol/L (ref 3.5–5.1)
Sodium: 141 mmol/L (ref 135–145)
Total Bilirubin: 0.8 mg/dL (ref 0.3–1.2)
Total Protein: 7.3 g/dL (ref 6.5–8.1)

## 2021-12-25 LAB — CBC
HCT: 39.1 % (ref 39.0–52.0)
Hemoglobin: 12.7 g/dL — ABNORMAL LOW (ref 13.0–17.0)
MCH: 27.5 pg (ref 26.0–34.0)
MCHC: 32.5 g/dL (ref 30.0–36.0)
MCV: 84.8 fL (ref 80.0–100.0)
Platelets: 162 10*3/uL (ref 150–400)
RBC: 4.61 MIL/uL (ref 4.22–5.81)
RDW: 12.5 % (ref 11.5–15.5)
WBC: 10 10*3/uL (ref 4.0–10.5)
nRBC: 0 % (ref 0.0–0.2)

## 2021-12-25 LAB — LIPASE, BLOOD: Lipase: 33 U/L (ref 11–51)

## 2021-12-25 IMAGING — CT CT ABD-PELV W/ CM
2 of 4 series · 16 of 46 positions shown, 18 images · IV contrast (agent unspecified)
Comparison: [DATE]

CLINICAL DATA: Pain left lower quadrant, vomiting

EXAM:
CT ABDOMEN AND PELVIS WITH CONTRAST
TECHNIQUE: Multidetector CT imaging of the abdomen and pelvis was performed
using the standard protocol following bolus administration of
intravenous contrast.

[Series 4: abdomen 5.0 · axial · 0.76mm/px · z∈[+995,+1420]mm · 13 of 97 slices shown, 15 images]
[im 6/97  soft-tissue]
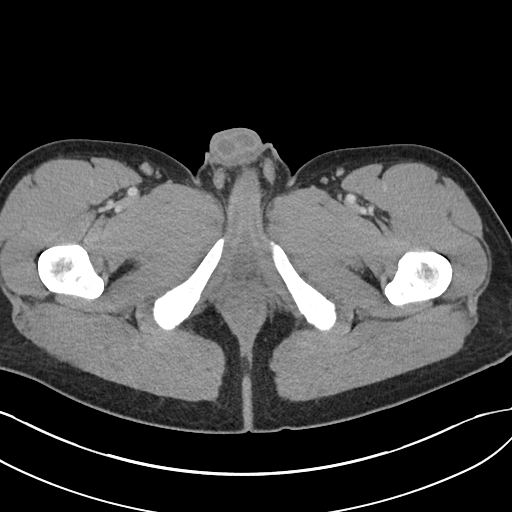
[im 6/97  bone]
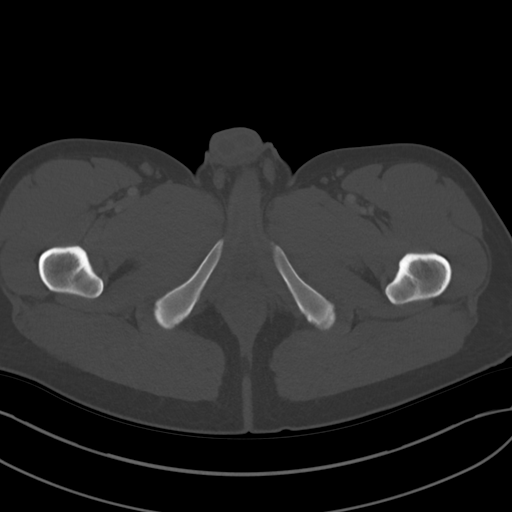
[im 16/97  soft-tissue]
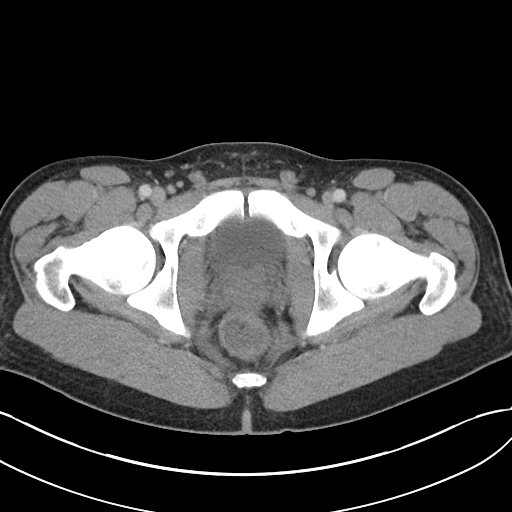
[im 21/97  soft-tissue]
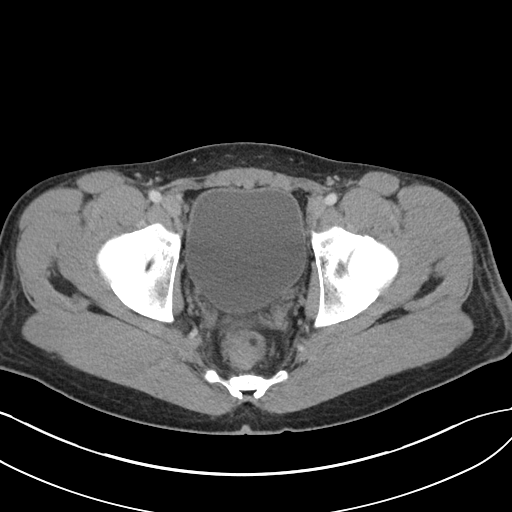
[im 26/97  soft-tissue]
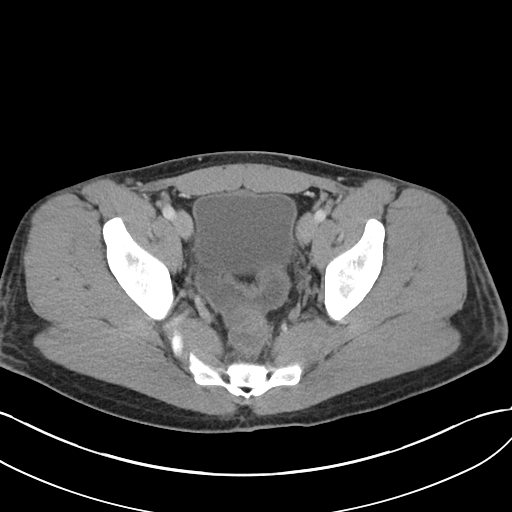
[im 36/97  soft-tissue]
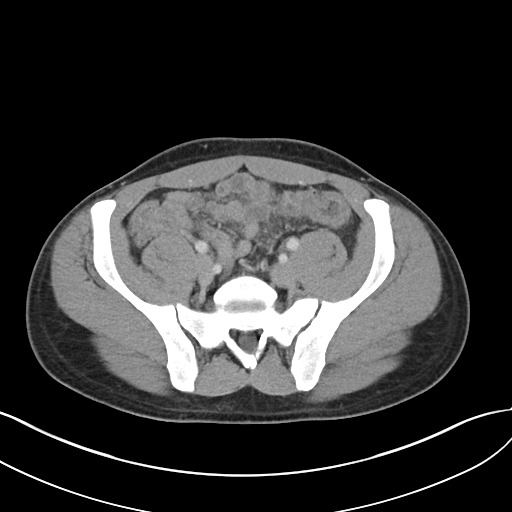
[im 41/97  soft-tissue]
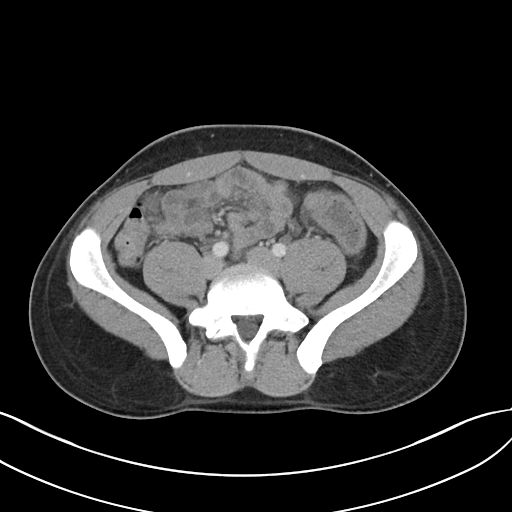
[im 51/97  soft-tissue]
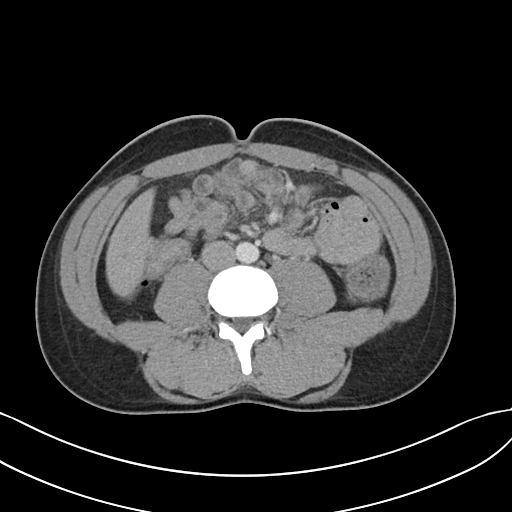
[im 56/97  soft-tissue]
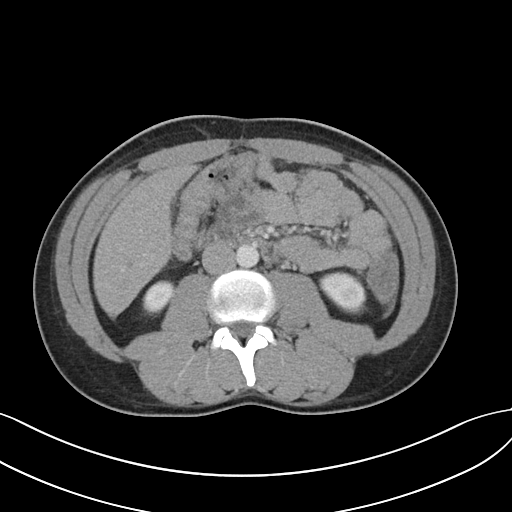
[im 61/97  soft-tissue]
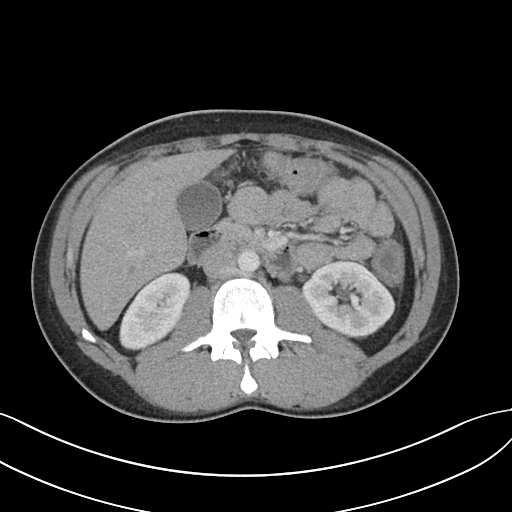
[im 61/97  bone]
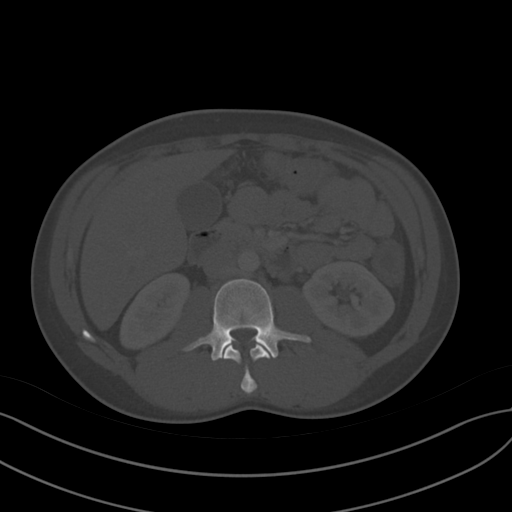
[im 71/97  soft-tissue]
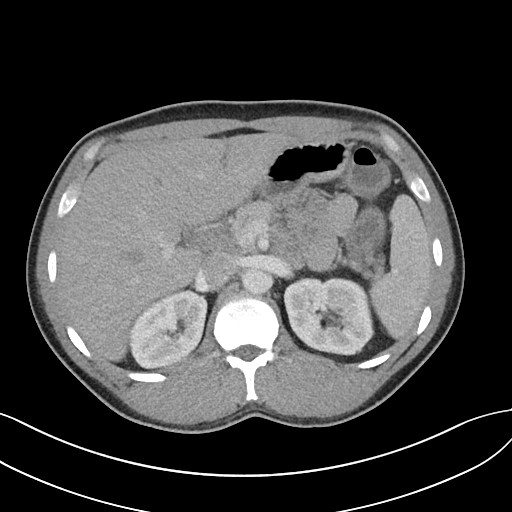
[im 76/97  soft-tissue]
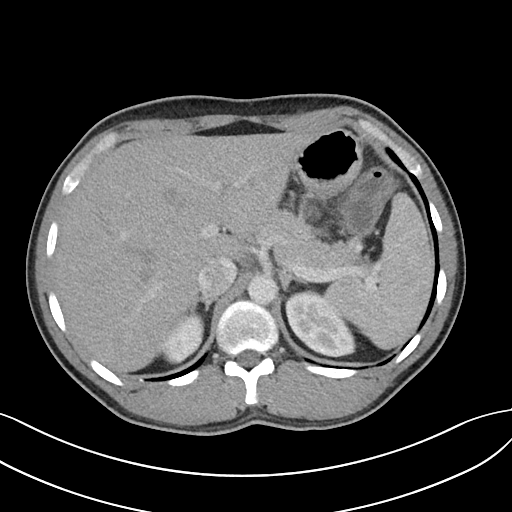
[im 81/97  soft-tissue]
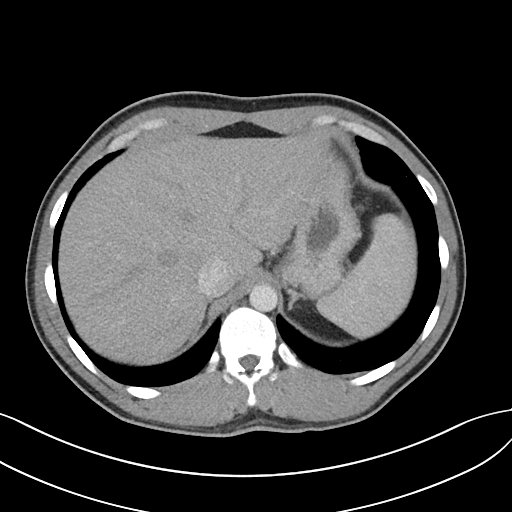
[im 91/97  soft-tissue]
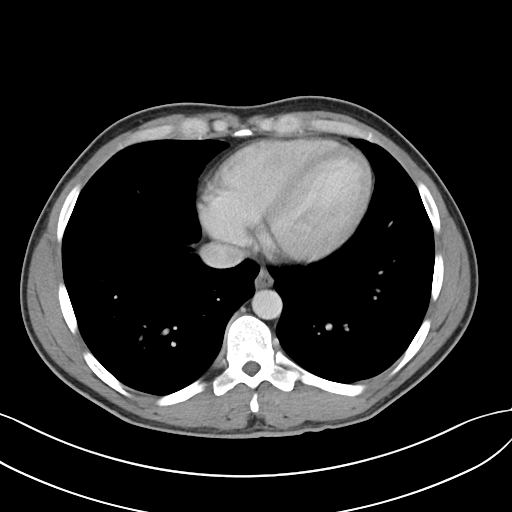

[Series 7: abdomen 3.0 mpr cor · coronal · 0.78mm/px · 3 of 84 slices shown]
[im 28/84  soft-tissue]
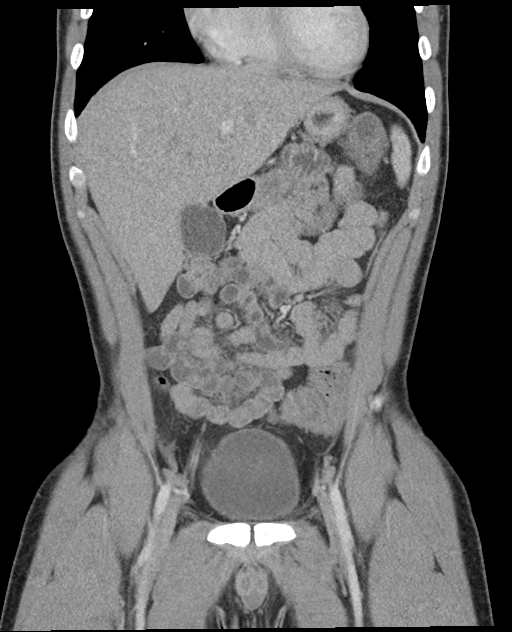
[im 37/84  soft-tissue]
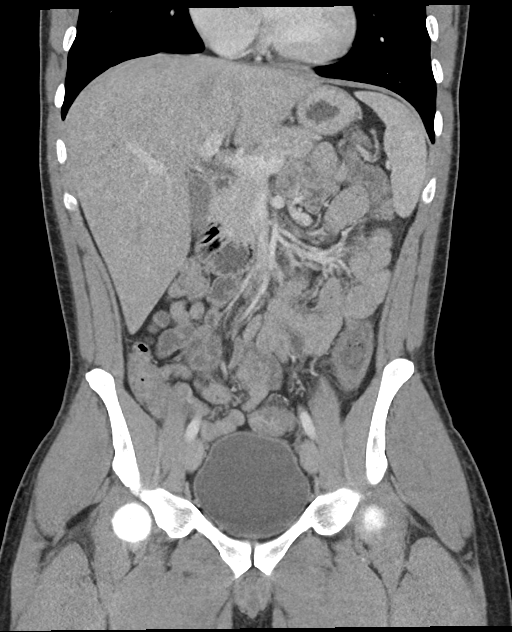
[im 47/84  soft-tissue]
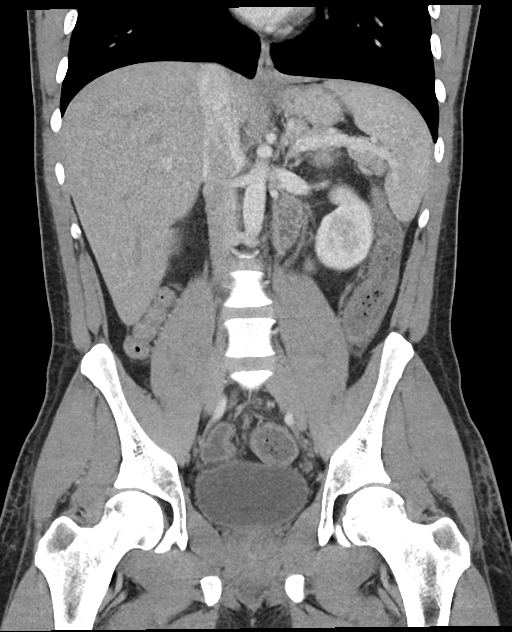

[16 of 46 positions shown; findings below may reference images not displayed]

RADIATION DOSE REDUCTION: This exam was performed according to the
departmental dose-optimization program which includes automated
exposure control, adjustment of the mA and/or kV according to
patient size and/or use of iterative reconstruction technique.

CONTRAST:  100mL OMNIPAQUE IOHEXOL 300 MG/ML  SOLN
FINDINGS: Lower chest: Small foci of reticulonodular densities are noted in
the lingula and left lower lobe with no significant interval change.

Hepatobiliary: Liver measures 21.5 cm in length. There is fatty
infiltration in the liver. No focal abnormality is seen. There is no
dilation of bile ducts. Gallbladder is unremarkable.

Pancreas: No focal abnormality is seen.

Spleen: Spleen measures 12.2 cm in maximum diameter.

Adrenals/Urinary Tract: Adrenals are unremarkable. There is no
hydronephrosis. There are no renal or ureteral stones. Urinary
bladder is unremarkable.

Stomach/Bowel: Stomach is unremarkable. Small bowel loops are not
dilated. Appendix is not dilated. There is diffuse wall thickening
in the colon. There is mild pericolic stranding. There is no
loculated pericolic fluid collection. There is mild wall thickening
in the rectum.

Vascular/Lymphatic: Unremarkable.

Reproductive: Unremarkable.

Other: There is no significant ascites or pneumoperitoneum.

Musculoskeletal: No acute findings are seen in the bony structures.
IMPRESSION: There is diffuse wall thickening in colon suggesting inflammatory or
infectious colitis.

There is no evidence of intestinal obstruction or pneumoperitoneum.
There is no hydronephrosis.

There are reticulonodular densities in the left lower lung fields in
the lingula and left lower lobe with no significant interval change.
This may suggest scarring from chronic inflammation or interstitial
pneumonia or aspiration. Enlarged fatty liver.

Other findings as described in the body of the report.

## 2021-12-25 MED ORDER — MORPHINE SULFATE (PF) 4 MG/ML IV SOLN
4.0000 mg | Freq: Once | INTRAVENOUS | Status: AC
Start: 2021-12-25 — End: 2021-12-25
  Administered 2021-12-25: 4 mg via INTRAVENOUS
  Filled 2021-12-25: qty 1

## 2021-12-25 MED ORDER — SODIUM CHLORIDE 0.9 % IV BOLUS
1000.0000 mL | Freq: Once | INTRAVENOUS | Status: AC
  Administered 2021-12-25: 1000 mL via INTRAVENOUS

## 2021-12-25 MED ORDER — IOHEXOL 300 MG/ML  SOLN
100.0000 mL | Freq: Once | INTRAMUSCULAR | Status: AC | PRN
  Administered 2021-12-25: 100 mL via INTRAVENOUS

## 2021-12-25 MED ORDER — ONDANSETRON HCL 4 MG/2ML IJ SOLN
4.0000 mg | INTRAMUSCULAR | Status: AC
Start: 2021-12-25 — End: 2021-12-25
  Administered 2021-12-25: 4 mg via INTRAVENOUS
  Filled 2021-12-25: qty 2

## 2021-12-25 MED ORDER — MORPHINE SULFATE (PF) 4 MG/ML IV SOLN
4.0000 mg | Freq: Once | INTRAVENOUS | Status: AC
  Administered 2021-12-25: 4 mg via INTRAVENOUS
  Filled 2021-12-25: qty 1

## 2021-12-25 MED ORDER — HYDROMORPHONE HCL 2 MG PO TABS: 2.0000 mg | ORAL_TABLET | Freq: Two times a day (BID) | ORAL | 0 refills | Status: AC | PRN

## 2021-12-25 NOTE — Discharge Instructions (Addendum)
No driving today or while taking hydromorphone prescription.  You were seen in the emergency room for abdominal pain. It is important that you follow up closely with your primary care doctor in the next couple of days.  Please return to the emergency room right away if you are to develop a fever, severe nausea, your pain becomes severe or worsens, you are unable to keep food down, begin vomiting any dark or bloody fluid, you develop any dark or bloody stools, feel dehydrated, or other new concerns or symptoms arise.

## 2021-12-25 NOTE — ED Provider Notes (Signed)
[facility] Provider Note    Event Date/Time   First MD Initiated Contact with Patient 12/25/21 1233     (approximate)   History   Abdominal Pain   HPI  Damon Blair is a 31 y.o. male with a history of multiple sclerosis, previous nephrolithiasis  Yesterday after he got home from the ER he started to notice that he is having loose stool and pain in his left lower abdomen.  She also had nausea taking Zofran which she has quite a bit of at home, and is had a couple episodes of vomiting this morning.  Reports pain in the very low to left lower abdomen.  No pain or burning with urination.  No back pain  Pain located in the lower left abdomen.  A couple loose stools nonbloody.  No bloody emesis.  No chest pain no fever chills denies travel history  Denies recent use of antibiotic  Patient was seen yesterday reports that he had choked on a [employer] sandwich, but he feels much better now that his symptoms have resolved.  No trouble breathing no cough or other concern.     Physical Exam   Triage Vital Signs: ED Triage Vitals [12/25/21 1232]  Enc Vitals Group     BP (!) 142/84     Pulse Rate 61     Resp 18     Temp 97.8 F (36.6 C)     Temp Source Oral     SpO2 95 %     Weight      Height      Head Circumference      Peak Flow      Pain Score 5     Pain Loc      Pain Edu?      Excl. in GC?     Most recent vital signs: Vitals:   12/25/21 1232 12/25/21 1357  BP: (!) 142/84 112/76  Pulse: 61 66  Resp: 18 16  Temp: 97.8 F (36.6 C)   SpO2: 95% 98%     General: Awake, no distress.  CV:  Good peripheral perfusion.  Normal heart tones Resp:  Normal effort.  Clear Abd:  No distention. no pain along the right side of the abdomen or left upper quadrant to palpation, but patient reports mild pain in the suprapubic and also currently moderate pain in the left lower quadrant without rebound or guarding.  No evidence of acute abdomen but does  have focal abdominal pain primarily over the left mid to left lower quadrant. Other:  Warm well-perfused extremity   ED Results / Procedures / Treatments   Labs (all labs ordered are listed, but only abnormal results are displayed) Labs Reviewed  CBC - Abnormal; Notable for the following components:      Result Value   Hemoglobin 12.7 (*)    All other components within normal limits  COMPREHENSIVE METABOLIC PANEL - Abnormal; Notable for the following components:   Glucose, Bld 132 (*)    All other components within normal limits  LIPASE, BLOOD     EKG     RADIOLOGY   Personally interpreted the patient's CT imaging for gross pathology which seems to have some inflammatory findings around segments of the colon  CT ABDOMEN PELVIS W CONTRAST  Result Date: 12/25/2021 CLINICAL DATA:  Pain left lower quadrant, vomiting EXAM: CT ABDOMEN AND PELVIS WITH CONTRAST TECHNIQUE: Multidetector CT imaging of the abdomen and pelvis was performed using the standard protocol following  bolus administration of intravenous contrast. RADIATION DOSE REDUCTION: This exam was performed according to the departmental dose-optimization program which includes automated exposure control, adjustment of the mA and/or kV according to patient size and/or use of iterative reconstruction technique. CONTRAST:  OMNIPAQUE IOHEXOL 300 MG/ML  SOLN COMPARISON:  11/15/2021 FINDINGS: Lower chest: Small foci of reticulonodular densities are noted in the lingula and left lower lobe with no significant interval change. Hepatobiliary: Liver measures 21.5 cm in length. There is fatty infiltration in the liver. No focal abnormality is seen. There is no dilation of bile ducts. Gallbladder is unremarkable. Pancreas: No focal abnormality is seen. Spleen: Spleen measures 12.2 cm in maximum diameter. Adrenals/Urinary Tract: Adrenals are unremarkable. There is no hydronephrosis. There are no renal or ureteral stones. Urinary bladder is  unremarkable. Stomach/Bowel: Stomach is unremarkable. Small bowel loops are not dilated. Appendix is not dilated. There is diffuse wall thickening in the colon. There is mild pericolic stranding. There is no loculated pericolic fluid collection. There is mild wall thickening in the rectum. Vascular/Lymphatic: Unremarkable. Reproductive: Unremarkable. Other: There is no significant ascites or pneumoperitoneum. Musculoskeletal: No acute findings are seen in the bony structures. IMPRESSION: There is diffuse wall thickening in colon suggesting inflammatory or infectious colitis. There is no evidence of intestinal obstruction or pneumoperitoneum. There is no hydronephrosis. There are reticulonodular densities in the left lower lung fields in the lingula and left lower lobe with no significant interval change. This may suggest scarring from chronic inflammation or interstitial pneumonia or aspiration. Enlarged fatty liver. Other findings as described in the body of the report. Electronically Signed   By: Ernie Avena M.D.   On: 12/25/2021 14:58      PROCEDURES:  Critical Care performed: No  Procedures   MEDICATIONS ORDERED IN ED: Medications  ondansetron (ZOFRAN) injection 4 mg (4 mg Intravenous Given 12/25/21 1252)  morphine (PF) 4 MG/ML injection 4 mg (4 mg Intravenous Given 12/25/21 1252)  sodium chloride 0.9 % bolus 1,000 mL (0 mLs Intravenous Stopped 12/25/21 1523)  iohexol (OMNIPAQUE) 300 MG/ML solution 100 mL (100 mLs Intravenous Contrast Given 12/25/21 1433)  morphine (PF) 4 MG/ML injection 4 mg (4 mg Intravenous Given 12/25/21 1456)     IMPRESSION / MDM / ASSESSMENT AND PLAN / ED COURSE  I reviewed the triage vital signs and the nursing notes.                              Differential diagnosis includes but is not limited to, abdominal perforation, aortic dissection, cholecystitis, appendicitis, diverticulitis, colitis, esophagitis/gastritis, kidney stone, pyelonephritis, urinary  tract infection, aortic aneurysm. All are considered in decision and treatment plan. Based upon the patient's presentation and risk factors, given the location of his pain loose stools and nausea plan provide pain medication, obtain imaging studies to evaluate for causes such as colitis diverticulitis or other acute intra-abdominal pathology.   Patient's presentation is most consistent with acute illness / injury with system symptoms.  The patient is on the cardiac monitor to evaluate for evidence of arrhythmia and/or significant heart rate changes.  Clinical Course as of 12/25/21 1533  Sun Dec 25, 2021  1333 Interpreted CBC is normal except for slight anemia which is chronic.  Also comprehensive metabolic panel LFTs and lipase normal.  Labs reassuring [MQ]    Clinical Course User Index [MQ] Sharyn Creamer, MD   ----------------------------------------- 3:32 PM on 12/25/2021 ----------------------------------------- Feeling markedly improved, alert well-oriented symptoms  of nausea and pain now much improved.  His work-up reassuring, and at this point discharged to home.  Discussed will provide brief prescription for hydromorphone which she has used successfully in the past and reports that he no longer has after previous acute care use.  Also reports oxycodone caused him itching.  I see no indication for antibiotic therapy at this time afebrile no travel history, likely self-limited etiology of colitis.  Recommend follow-up closely with his primary care physician.  He is not driving himself home knows not to drive while taking hydrocodone and only use it and store safely and as always as prescribed  Vitals:   12/25/21 1232 12/25/21 1357  BP: (!) 142/84 112/76  Pulse: 61 66  Resp: 18 16  Temp: 97.8 F (36.6 C)   SpO2: 95% 98%    Discussed with patient that he could provide a stool sample which could be collected and submitted to the lab and I have placed order for same.   Reviewed  prescriber database, several recent prescriptions for controlled substances.  Patient reports he no longer takes Percocet, he has used the previously prescribed hydromorphone.  Knows not to use this with other sedatives or other prescription pain medication and to only use as prescribed  Return precautions and treatment recommendations and follow-up discussed with the patient who is agreeable with the plan.   FINAL CLINICAL IMPRESSION(S) / ED DIAGNOSES   Final diagnoses:  Colitis     Rx / DC Orders   ED Discharge Orders          Ordered    HYDROmorphone (DILAUDID) 2 MG tablet  Every 12 hours PRN        12/25/21 1520    Stool culture        12/25/21 1521    Clostridium difficile,EIA        12/25/21 1521             Note:  This document was prepared using Dragon voice recognition software and may include unintentional dictation errors.   Sharyn Creamer, MD 12/25/21 1534

## 2021-12-25 NOTE — ED Triage Notes (Addendum)
Pt comes pov for abd pain. States he was seen here yesterday for food bolus in throat and was given some meds including some miralax and started having severe abd pain today with n/v. Unable to keep food down. Emesis is clear. Has had small Bms.

## 2021-12-25 NOTE — ED Notes (Signed)
Patient transported to CT 

## 2021-12-26 ENCOUNTER — Other Ambulatory Visit
Admission: RE | Admit: 2021-12-26 | Discharge: 2021-12-26 | Disposition: A | Discharge status: Home / Self Care | Payer: Managed Care, Other (non HMO) | Source: Ambulatory Visit | Attending: Emergency Medicine | Admitting: Emergency Medicine

## 2021-12-26 DIAGNOSIS — R197 Diarrhea, unspecified: Secondary | ICD-10-CM | POA: Insufficient documentation

## 2021-12-26 LAB — C DIFFICILE QUICK SCREEN W PCR REFLEX
C Diff antigen: NEGATIVE
C Diff interpretation: NOT DETECTED
C Diff toxin: NEGATIVE

## 2022-01-20 ENCOUNTER — Emergency Department
Admission: EM | Admit: 2022-01-20 | Discharge: 2022-01-21 | Disposition: A | Discharge status: Home / Self Care | Payer: Managed Care, Other (non HMO) | Attending: Emergency Medicine | Admitting: Emergency Medicine

## 2022-01-20 ENCOUNTER — Other Ambulatory Visit: Payer: Self-pay

## 2022-01-20 DIAGNOSIS — R0602 Shortness of breath: Secondary | ICD-10-CM | POA: Diagnosis not present

## 2022-01-20 DIAGNOSIS — R0789 Other chest pain: Secondary | ICD-10-CM | POA: Insufficient documentation

## 2022-01-20 DIAGNOSIS — R1012 Left upper quadrant pain: Secondary | ICD-10-CM | POA: Diagnosis not present

## 2022-01-20 LAB — COMPREHENSIVE METABOLIC PANEL
ALT: 12 U/L (ref 0–44)
AST: 16 U/L (ref 15–41)
Albumin: 4.5 g/dL (ref 3.5–5.0)
Alkaline Phosphatase: 45 U/L (ref 38–126)
Anion gap: 8 (ref 5–15)
BUN: 16 mg/dL (ref 6–20)
CO2: 23 mmol/L (ref 22–32)
Calcium: 9.2 mg/dL (ref 8.9–10.3)
Chloride: 109 mmol/L (ref 98–111)
Creatinine, Ser: 0.89 mg/dL (ref 0.61–1.24)
GFR, Estimated: >60 mL/min (ref >60)
Glucose, Bld: 80 mg/dL (ref 70–99)
Potassium: 4 mmol/L (ref 3.5–5.1)
Sodium: 140 mmol/L (ref 135–145)
Total Bilirubin: 0.3 mg/dL (ref 0.3–1.2)
Total Protein: 7.4 g/dL (ref 6.5–8.1)

## 2022-01-20 LAB — CBC
HCT: 38.1 % — ABNORMAL LOW (ref 39.0–52.0)
Hemoglobin: 12.9 g/dL — ABNORMAL LOW (ref 13.0–17.0)
MCH: 28.4 pg (ref 26.0–34.0)
MCHC: 33.9 g/dL (ref 30.0–36.0)
MCV: 83.7 fL (ref 80.0–100.0)
Platelets: 168 10*3/uL (ref 150–400)
RBC: 4.55 MIL/uL (ref 4.22–5.81)
RDW: 12.2 % (ref 11.5–15.5)
WBC: 6 10*3/uL (ref 4.0–10.5)
nRBC: 0 % (ref 0.0–0.2)

## 2022-01-20 LAB — URINALYSIS, ROUTINE W REFLEX MICROSCOPIC
Bilirubin Urine: NEGATIVE
Glucose, UA: NEGATIVE mg/dL
Hgb urine dipstick: NEGATIVE
Ketones, ur: NEGATIVE mg/dL
Leukocytes,Ua: NEGATIVE
Nitrite: NEGATIVE
Protein, ur: NEGATIVE mg/dL
Specific Gravity, Urine: 1.025 (ref 1.005–1.030)
pH: 5 (ref 5.0–8.0)

## 2022-01-20 LAB — TROPONIN I (HIGH SENSITIVITY)
Troponin I (High Sensitivity): <2 ng/L (ref <18)
Troponin I (High Sensitivity): <2 ng/L (ref <18)

## 2022-01-20 LAB — LIPASE, BLOOD: Lipase: 34 U/L (ref 11–51)

## 2022-01-20 NOTE — ED Provider Triage Note (Signed)
Emergency Medicine Provider Triage Evaluation Note  Damon Blair , a 31 y.o. male  was evaluated in triage.  Pt complains of left upper quadrant abdominal pain for the last 2 days.  Patient also reports some tenderness to palpation of the area.  Denies any associated nausea, vomiting, diarrhea, or fevers..  Review of Systems  Positive: Left upper quadrant abdominal pain Negative: FCS  Physical Exam  BP 119/88   Pulse 88   Temp 98.6 F (37 C) (Oral)   Resp 20   Ht 5\' 9"  (1.753 m)   Wt 86.2 kg   SpO2 100%   BMI 28.06 kg/m  Gen:   Awake, no distress  NAD Resp:  Normal effort  MSK:   Moves extremities without difficulty  Other:    Medical Decision Making  Medically screening exam initiated at 7:11 PM.  Appropriate orders placed.  Damon Blair was informed that the remainder of the evaluation will be completed by another provider, this initial triage assessment does not replace that evaluation, and the importance of remaining in the ED until their evaluation is complete.  Patient to the ED for evaluation of left upper quadrant abdominal pain.   Lissa Hoard, PA-C 01/22/22 0005

## 2022-01-20 NOTE — ED Triage Notes (Signed)
Pt states left upper quadrant pain for two days. Pt states tenderness when palpating area. Pt denies nausea, vomiting, fever.

## 2022-01-21 ENCOUNTER — Emergency Department: Payer: Managed Care, Other (non HMO)

## 2022-01-21 MED ORDER — IOHEXOL 350 MG/ML SOLN
100.0000 mL | Freq: Once | INTRAVENOUS | Status: AC | PRN
Start: 2022-01-21 — End: 2022-01-21
  Administered 2022-01-21: 100 mL via INTRAVENOUS

## 2022-01-21 MED ORDER — ONDANSETRON 4 MG PO TBDP: 4.0000 mg | ORAL_TABLET | Freq: Four times a day (QID) | ORAL | 0 refills | Status: DC | PRN

## 2022-01-21 MED ORDER — ONDANSETRON HCL 4 MG/2ML IJ SOLN
4.0000 mg | Freq: Once | INTRAMUSCULAR | Status: AC
  Administered 2022-01-21: 4 mg via INTRAVENOUS
  Filled 2022-01-21: qty 2

## 2022-01-21 MED ORDER — SODIUM CHLORIDE 0.9 % IV BOLUS (SEPSIS)
1000.0000 mL | Freq: Once | INTRAVENOUS | Status: AC
  Administered 2022-01-21: 1000 mL via INTRAVENOUS

## 2022-01-21 MED ORDER — MORPHINE SULFATE (PF) 4 MG/ML IV SOLN
4.0000 mg | Freq: Once | INTRAVENOUS | Status: AC
  Administered 2022-01-21: 4 mg via INTRAVENOUS
  Filled 2022-01-21: qty 1

## 2022-01-21 MED ORDER — DICYCLOMINE HCL 20 MG PO TABS: 20.0000 mg | ORAL_TABLET | Freq: Three times a day (TID) | ORAL | 0 refills | Status: DC | PRN
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
## 2022-01-21 NOTE — Discharge Instructions (Signed)
Your labs, urine, CT scans were reassuring today.  I recommend close follow-up with your primary care doctor as well as gastroenterology.  I recommend a bland diet for the next several days.

## 2022-01-26 ENCOUNTER — Encounter: Payer: Self-pay | Admitting: Emergency Medicine

## 2022-01-26 ENCOUNTER — Ambulatory Visit
Admission: EM | Admit: 2022-01-26 | Discharge: 2022-01-26 | Disposition: A | Discharge status: Home / Self Care | Payer: Managed Care, Other (non HMO) | Attending: Family Medicine | Admitting: Family Medicine

## 2022-01-26 ENCOUNTER — Ambulatory Visit: Payer: Managed Care, Other (non HMO)

## 2022-01-26 DIAGNOSIS — S97102A Crushing injury of unspecified left toe(s), initial encounter: Secondary | ICD-10-CM | POA: Diagnosis not present

## 2022-01-26 DIAGNOSIS — S9782XA Crushing injury of left foot, initial encounter: Secondary | ICD-10-CM | POA: Diagnosis not present

## 2022-01-26 MED ORDER — IBUPROFEN 800 MG PO TABS: 800.0000 mg | ORAL_TABLET | Freq: Two times a day (BID) | ORAL | 0 refills | Status: AC | PRN

## 2022-01-26 NOTE — ED Triage Notes (Addendum)
Pt hit his 2nd toe on a curb this morning while riding his skateboard. His toe is bruised and painful

## 2022-01-26 NOTE — ED Provider Notes (Signed)
[name]    CSN: [id number] Arrival date & time: 01/26/22  1626      History   Chief Complaint Chief Complaint  Patient presents with   Foot Injury    Larey Seat while stopping my longboard and I slammed my toe into a curve. - Entered by patient   Toe Injury    HPI Damon Blair is a 31 y.o. male.   HPI Patient presents today with for evaluation of injury involving the left foot which occurred early this morning while walking his dog and long boarding. He reports his dog got a little head of him which caused him to fall from the long board and hit his left great toe and 2nd toe on the cement curve. He initially had mild pain after the injury however after working all day and standing on his feet, his toes have become more swollen and the 2nd toe is now very swollen and tender to touch,  Past Medical History:  Diagnosis Date   MS (multiple sclerosis) (HCC)     Patient Active Problem List   Diagnosis Date Noted   Hydronephrosis with urinary obstruction due to ureteral calculus 11/15/2021   Pyelonephritis of left kidney 11/15/2021   Acute pyelonephritis 11/15/2021   Heroin abuse (HCC) 12/10/2020   Elevated blood pressure reading 05/27/2020   Urinary incontinence 11/08/2019   History of hepatitis C 10/08/2019   Spasticity 01/01/2019   Multiple sclerosis (HCC) 12/29/2018   Anxiety 05/31/2018   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   ADHD (attention deficit hyperactivity disorder), combined type 12/22/2015   Continuous cannabis use 12/22/2015   Acute upper respiratory infection 03/01/2015   Need for diphtheria-tetanus-pertussis (Tdap) vaccine 03/01/2015   Open wound of left hand 03/01/2015   Acute pharyngitis 03/01/2015    Past Surgical History:  Procedure Laterality Date   CYSTOSCOPY W/ RETROGRADES  11/18/2021   Procedure: CYSTOSCOPY WITH RETROGRADE PYELOGRAM;  Surgeon: Sondra Come, MD;  Location: [facility] ORS;  Service: Urology;;    CYSTOSCOPY/URETEROSCOPY/HOLMIUM LASER/STENT PLACEMENT Left 11/18/2021   Procedure: CYSTOSCOPY/URETEROSCOPY/HOLMIUM LASER/STENT PLACEMENT;  Surgeon: Sondra Come, MD;  Location: [facility] ORS;  Service: Urology;  Laterality: Left;   FRACTURE SURGERY Left        Home Medications    Prior to Admission medications   Medication Sig Start Date End Date Taking? Authorizing Provider  amphetamine-dextroamphetamine (ADDERALL) 5 MG tablet Take 5 mg by mouth daily. 08/30/20  Yes [provider]  baclofen (LIORESAL) 20 MG tablet Take 20 mg by mouth 3 (three) times daily.   Yes [provider]  cetirizine (ZYRTEC) 10 MG tablet Take 10 mg by mouth as needed.   Yes [provider]  diazepam (VALIUM) 5 MG tablet Take 5 mg by mouth as needed for anxiety.   Yes [provider]  dicyclomine (BENTYL) 20 MG tablet Take 1 tablet (20 mg total) by mouth every 8 (eight) hours as needed for spasms (Abdominal cramping). 01/21/22  Yes Ward, Layla Maw, DO  dronabinol (MARINOL) 5 MG capsule Take 5 mg by mouth 3 (three) times daily. 09/08/20  Yes [provider]  Glatiramer Acetate 40 MG/ML SOSY Inject into the skin. 07/23/20  Yes [provider]  ibuprofen (ADVIL) 800 MG tablet Take 1 tablet (800 mg total) by mouth 2 (two) times daily as needed for up to 7 days for mild pain or moderate pain. 01/26/22 02/02/22 Yes Bing Neighbors, FNP  imiquimod (ALDARA) 5 % cream SMARTSIG:1 Packet(s) Topical 3 Times  a Week 10/24/21  Yes [provider]  ondansetron (ZOFRAN) 4 MG tablet Take 1 tablet (4 mg total) by mouth every 6 (six) hours as needed for nausea. 11/16/21  Yes Swayze, Ava, DO  pregabalin (LYRICA) 200 MG capsule Take 200 mg by mouth in the morning, at noon, and at bedtime.   Yes [provider]  ondansetron (ZOFRAN-ODT) 4 MG disintegrating tablet Take 1 tablet (4 mg total) by mouth every 6 (six) hours as needed for nausea or vomiting. 01/21/22   Ward, Layla Maw, DO   oxybutynin (DITROPAN) 5 MG tablet Take 1 tablet (5 mg total) by mouth every 8 (eight) hours as needed for up to 9 doses for bladder spasms. 11/20/21   Willy Eddy, MD  tamsulosin (FLOMAX) 0.4 MG CAPS capsule Take 1 capsule (0.4 mg total) by mouth daily. 10/27/21   Sharyn Creamer, MD    Family History History reviewed. No pertinent family history.  Social History Social History   Tobacco Use   Smoking status: Former    Types: Cigarettes    Passive exposure: Never   Smokeless tobacco: Former    Types: Snuff  Vaping Use   Vaping Use: Former  Substance Use Topics   Alcohol use: Not Currently   Drug use: Not Currently     Allergies   Clavulanic acid and Oxycodone   Review of Systems Review of Systems Pertinent negatives listed in HPI   Physical Exam Triage Vital Signs ED Triage Vitals  Enc Vitals Group     BP 01/26/22 1653 131/88     Pulse Rate 01/26/22 1653 82     Resp 01/26/22 1653 18     Temp 01/26/22 1653 98.6 F (37 C)     Temp Source 01/26/22 1653 Oral     SpO2 01/26/22 1653 98 %     Weight --      Height --      Head Circumference --      Peak Flow --      Pain Score 01/26/22 1651 7     Pain Loc --      Pain Edu? --      Excl. in GC? --    No data found.  Updated Vital Signs BP 131/88 (BP Location: Left Arm)   Pulse 82   Temp 98.6 F (37 C) (Oral)   Resp 18   SpO2 98%   Visual Acuity Right Eye Distance:   Left Eye Distance:   Bilateral Distance:    Right Eye Near:   Left Eye Near:    Bilateral Near:     Physical Exam   UC Treatments / Results  Labs (all labs ordered are listed, but only abnormal results are displayed) Labs Reviewed - No data to display  EKG   Radiology DG Foot Complete Left  Result Date: 01/26/2022 CLINICAL DATA:  Fall, injury to great toe and 2nd toe. EXAM: LEFT FOOT - COMPLETE 3+ VIEW COMPARISON:  None Available. FINDINGS: No acute bony abnormality. Specifically, no fracture, subluxation, or dislocation. Joint  spaces maintained. Soft tissues intact. Postoperative changes visualized in the distal fibula. IMPRESSION: No acute bony abnormality. Electronically Signed   By: Charlett Nose M.D.   On: 01/26/2022 17:25    Procedures Procedures (including critical care time)  Medications Ordered in UC Medications - No data to display  Initial Impression / Assessment and Plan / UC Course  I have reviewed the triage vital signs and the nursing notes.  Pertinent labs &  imaging results that were available during my care of the patient were reviewed by me and considered in my medical decision making (see chart for details).    Crush injury involving the left foot Imaging negative for acute fracture Recommend icing foot several times daily and keeping foot elevated over the next 3 days. Patient advised he not taking Toradol, prescribed Ibuprofen x 7 days. Follow-up with [facility] if any your symptoms worsen or do not improve. Final Clinical Impressions(s) / UC Diagnoses   Final diagnoses:  Crush injury of left foot, initial encounter     Discharge Instructions      Recommend elevation and icing consistently over the next 3 days. Take Ibuprofen twice daily for at minimum 5 days but can take up to 7 days. Avoid wearing tight fitting shoe wear until swelling and pain subsides. if you continue to have any further symptoms follow-up with [facility] .     ED Prescriptions     Medication Sig Dispense Auth. Provider   ibuprofen (ADVIL) 800 MG tablet Take 1 tablet (800 mg total) by mouth 2 (two) times daily as needed for up to 7 days for mild pain or moderate pain. 14 tablet Bing Neighbors, FNP      PDMP not reviewed this encounter.   Bing Neighbors, FNP 01/26/22 1857

## 2022-01-26 NOTE — Discharge Instructions (Addendum)
Recommend elevation and icing consistently over the next 3 days. Take Ibuprofen twice daily for at minimum 5 days but can take up to 7 days. Avoid wearing tight fitting shoe wear until swelling and pain subsides. if you continue to have any further symptoms follow-up with [facility] .

## 2022-02-16 ENCOUNTER — Ambulatory Visit: Payer: Managed Care, Other (non HMO) | Admitting: Urology

## 2022-02-21 ENCOUNTER — Ambulatory Visit: Payer: Managed Care, Other (non HMO) | Admitting: Urology

## 2022-02-21 ENCOUNTER — Encounter: Payer: Self-pay | Admitting: Urology

## 2022-02-21 VITALS — BP 125/74 | HR 63 | Ht 69.0 in | Wt 192.0 lb

## 2022-02-21 DIAGNOSIS — Z87442 Personal history of urinary calculi: Secondary | ICD-10-CM | POA: Diagnosis not present

## 2022-02-21 DIAGNOSIS — N200 Calculus of kidney: Secondary | ICD-10-CM

## 2022-02-21 NOTE — Patient Instructions (Signed)

## 2022-02-21 NOTE — Progress Notes (Signed)
   02/21/2022 4:06 PM   Damon Blair [birth year] [id number]  Reason for visit: Follow up nephrolithiasis  HPI: 31 year old male with MS who originally presented with a 3 mm left distal ureteral stone and failed a trial of medical expulsive therapy.  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  He denies any stone episodes since that time.  He has had a few episodes of lower abdominal pain and was seen in the ER, but CT abdomen and pelvis was benign.  I personally reviewed and interpreted those images that show no evidence of hydronephrosis or nephrolithiasis.  Urinalysis at that time was also benign.  We discussed general stone prevention strategies including adequate hydration with goal of producing 2.5 L of urine daily, increasing citric acid intake, increasing calcium intake during high oxalate meals, minimizing animal protein, and decreasing salt intake. Information about dietary recommendations given today.   He prefers follow-up as needed, return precautions discussed   Sondra Come, MD  [facility] [address], [address] [place], [place] [address] [phone]

## 2022-05-28 ENCOUNTER — Encounter: Payer: Self-pay | Admitting: Emergency Medicine

## 2022-05-28 ENCOUNTER — Emergency Department: Payer: Managed Care, Other (non HMO)

## 2022-05-28 ENCOUNTER — Emergency Department
Admission: EM | Admit: 2022-05-28 | Discharge: 2022-05-28 | Disposition: A | Discharge status: Home / Self Care | Payer: Managed Care, Other (non HMO) | Attending: Emergency Medicine | Admitting: Emergency Medicine

## 2022-05-28 ENCOUNTER — Other Ambulatory Visit: Payer: Self-pay

## 2022-05-28 DIAGNOSIS — R32 Unspecified urinary incontinence: Secondary | ICD-10-CM | POA: Diagnosis present

## 2022-05-28 DIAGNOSIS — M546 Pain in thoracic spine: Secondary | ICD-10-CM | POA: Diagnosis not present

## 2022-05-28 LAB — BASIC METABOLIC PANEL
Anion gap: 4 — ABNORMAL LOW (ref 5–15)
BUN: 22 mg/dL — ABNORMAL HIGH (ref 6–20)
CO2: 27 mmol/L (ref 22–32)
Calcium: 9.2 mg/dL (ref 8.9–10.3)
Chloride: 111 mmol/L (ref 98–111)
Creatinine, Ser: 0.69 mg/dL (ref 0.61–1.24)
GFR, Estimated: >60 mL/min (ref >60)
Glucose, Bld: 109 mg/dL — ABNORMAL HIGH (ref 70–99)
Potassium: 4.5 mmol/L (ref 3.5–5.1)
Sodium: 142 mmol/L (ref 135–145)

## 2022-05-28 LAB — URINALYSIS, ROUTINE W REFLEX MICROSCOPIC
Bilirubin Urine: NEGATIVE
Glucose, UA: NEGATIVE mg/dL
Hgb urine dipstick: NEGATIVE
Ketones, ur: NEGATIVE mg/dL
Leukocytes,Ua: NEGATIVE
Nitrite: NEGATIVE
Protein, ur: NEGATIVE mg/dL
Specific Gravity, Urine: 1.017 (ref 1.005–1.030)
pH: 6 (ref 5.0–8.0)

## 2022-05-28 LAB — CBC WITH DIFFERENTIAL/PLATELET
Abs Immature Granulocytes: 0.01 10*3/uL (ref 0.00–0.07)
Basophils Absolute: 0.1 10*3/uL (ref 0.0–0.1)
Basophils Relative: 1 %
Eosinophils Absolute: 0.4 10*3/uL (ref 0.0–0.5)
Eosinophils Relative: 8 %
HCT: 38.3 % — ABNORMAL LOW (ref 39.0–52.0)
Hemoglobin: 13 g/dL (ref 13.0–17.0)
Immature Granulocytes: 0 %
Lymphocytes Relative: 37 %
Lymphs Abs: 1.9 10*3/uL (ref 0.7–4.0)
MCH: 28.7 pg (ref 26.0–34.0)
MCHC: 33.9 g/dL (ref 30.0–36.0)
MCV: 84.5 fL (ref 80.0–100.0)
Monocytes Absolute: 0.5 10*3/uL (ref 0.1–1.0)
Monocytes Relative: 10 %
Neutro Abs: 2.2 10*3/uL (ref 1.7–7.7)
Neutrophils Relative %: 44 %
Platelets: 184 10*3/uL (ref 150–400)
RBC: 4.53 MIL/uL (ref 4.22–5.81)
RDW: 12.2 % (ref 11.5–15.5)
WBC: 5.1 10*3/uL (ref 4.0–10.5)
nRBC: 0 % (ref 0.0–0.2)

## 2022-05-28 MED ORDER — LACTATED RINGERS IV BOLUS
1000.0000 mL | Freq: Once | INTRAVENOUS | Status: AC
  Administered 2022-05-28: 1000 mL via INTRAVENOUS

## 2022-05-28 MED ORDER — MORPHINE SULFATE (PF) 2 MG/ML IV SOLN
2.0000 mg | Freq: Once | INTRAVENOUS | Status: AC
  Administered 2022-05-28: 2 mg via INTRAVENOUS
  Filled 2022-05-28: qty 1

## 2022-05-28 MED ORDER — LIDOCAINE 5 % EX PTCH
1.0000 | MEDICATED_PATCH | CUTANEOUS | Status: DC
  Administered 2022-05-28: 1 via TRANSDERMAL
  Filled 2022-05-28: qty 1

## 2022-05-28 MED ORDER — KETOROLAC TROMETHAMINE 30 MG/ML IJ SOLN
15.0000 mg | Freq: Once | INTRAMUSCULAR | Status: AC
Start: 2022-05-28 — End: 2022-05-28
  Administered 2022-05-28: 15 mg via INTRAVENOUS
  Filled 2022-05-28: qty 1

## 2022-05-28 MED ORDER — MORPHINE SULFATE (PF) 4 MG/ML IV SOLN
4.0000 mg | Freq: Once | INTRAVENOUS | Status: AC
  Administered 2022-05-28: 4 mg via INTRAVENOUS
  Filled 2022-05-28: qty 1

## 2022-05-28 MED ORDER — GADOBUTROL 1 MMOL/ML IV SOLN
7.5000 mL | Freq: Once | INTRAVENOUS | Status: AC | PRN
  Administered 2022-05-28: 7.5 mL via INTRAVENOUS

## 2022-05-28 MED ORDER — OXYCODONE-ACETAMINOPHEN 5-325 MG PO TABS: 1.0000 | ORAL_TABLET | Freq: Three times a day (TID) | ORAL | 0 refills | Status: DC | PRN

## 2022-05-28 MED ORDER — LORAZEPAM 2 MG/ML IJ SOLN
1.0000 mg | INTRAMUSCULAR | Status: DC | PRN
  Administered 2022-05-28: 1 mg via INTRAVENOUS
  Filled 2022-05-28: qty 1

## 2022-05-28 NOTE — ED Provider Notes (Signed)
[facility] Provider Note    Event Date/Time   First MD Initiated Contact with Patient 05/28/22 [phone]     (approximate)   History   Chief Complaint urinary problem   HPI  Damon Blair is a 31 y.o. male with past medical history of multiple sclerosis, prior IV drug use, and hepatitis C who presents to the ED complaining of urinary incontinence.  Patient reports that over about the past week and a half he has noticed that his stream when urinating is significantly weaker than usual and that he is incontinent between attempts at urinating.  He denies any associated dysuria, hematuria, fever or flank pain.  He does report associated pain in the middle of his mid back radiating down his left leg, additionally states that the grip strength in his left hand has been diminished compared to usual.  He is concerned that the symptoms could be due to a flare of his MS, describes similar symptoms with an MS flare in the past.  He has not had any fevers, cough, chest pain, or shortness of breath, denies any nausea or vomiting.  He primarily follows with [facility] for his MS.     Physical Exam   Triage Vital Signs: ED Triage Vitals [05/28/22 0430]  Enc Vitals Group     BP 139/78     Pulse Rate 82     Resp 16     Temp 97.9 F (36.6 C)     Temp Source Oral     SpO2 100 %     Weight 190 lb (86.2 kg)     Height 5\' 9"  (1.753 m)     Head Circumference      Peak Flow      Pain Score 7     Pain Loc      Pain Edu?      Excl. in GC?     Most recent vital signs: Vitals:   05/28/22 1330 05/28/22 1400  BP: 115/69 119/61  Pulse: 65 67  Resp: 18 19  Temp:    SpO2: 100% 98%    Constitutional: Alert and oriented. Eyes: Conjunctivae are normal. Head: Atraumatic. Nose: No congestion/rhinnorhea. Mouth/Throat: Mucous membranes are moist.  Neck: No midline cervical spine tenderness to palpation. Cardiovascular: Normal rate, regular rhythm. Grossly normal heart  sounds.  2+ radial pulses bilaterally. Respiratory: Normal respiratory effort.  No retractions. Lungs CTAB. Gastrointestinal: Soft and nontender. No distention. Musculoskeletal: No lower extremity tenderness nor edema.  Midline thoracic spinal tenderness to palpation noted. Neurologic:  Normal speech and language.  4+ out of 5 strength in left upper and lower extremities, 5 out of 5 strength in right upper and lower extremities.    ED Results / Procedures / Treatments   Labs (all labs ordered are listed, but only abnormal results are displayed) Labs Reviewed  CBC WITH DIFFERENTIAL/PLATELET - Abnormal; Notable for the following components:      Result Value   HCT 38.3 (*)    All other components within normal limits  BASIC METABOLIC PANEL - Abnormal; Notable for the following components:   Glucose, Bld 109 (*)    BUN 22 (*)    Anion gap 4 (*)    All other components within normal limits  URINALYSIS, ROUTINE W REFLEX MICROSCOPIC - Abnormal; Notable for the following components:   Color, Urine YELLOW (*)    APPearance CLEAR (*)    All other components within normal limits    PROCEDURES:  Critical Care performed: No  Procedures   MEDICATIONS ORDERED IN ED: Medications  lidocaine (LIDODERM) 5 % 1 patch (1 patch Transdermal Patch Applied 05/28/22 0919)  LORazepam (ATIVAN) injection 1 mg (1 mg Intravenous Given 05/28/22 1410)  gadobutrol (GADAVIST) 1 MMOL/ML injection 7.5 mL (has no administration in time range)  ketorolac (TORADOL) 30 MG/ML injection 15 mg (15 mg Intravenous Given 05/28/22 0916)  lactated ringers bolus 1,000 mL (0 mLs Intravenous Stopped 05/28/22 1130)  morphine (PF) 4 MG/ML injection 4 mg (4 mg Intravenous Given 05/28/22 1214)  morphine (PF) 2 MG/ML injection 2 mg (2 mg Intravenous Given 05/28/22 1409)     IMPRESSION / MDM / ASSESSMENT AND PLAN / ED COURSE  I reviewed the triage vital signs and the nursing notes.                              31 y.o.  male with past medical history of multiple sclerosis, IV drug use, and hepatitis C who presents to the ED with about a week and a half of weak stream and urinary incontinence associated with midline thoracic back pain radiating down his left leg and weakness with left hand grip.  Patient's presentation is most consistent with acute presentation with potential threat to life or bodily function.  Differential diagnosis includes, but is not limited to, multiple sclerosis flare, cauda equina, spinal stenosis, radiculopathy, UTI, AKI, electrolyte abnormality.  Patient nontoxic-appearing and in no acute distress, vital signs are unremarkable.  He does appear slightly weak on his left side compared to the right on my examination, combined with his urinary incontinence this would be concerning for MS flare or other spinal pathology.  Findings reviewed with Dr. Selina Cooley of neurology, who recommends proceeding with MRI of brain, cervical, thoracic, and lumbar spine with and without contrast.  Labs are reassuring with no significant anemia, leukocytosis, electrode abnormality, or AKI.  Patient does not have any signs of urinary tract infection to explain his incontinence.  We will treat symptomatically with IV Toradol and Lidoderm patch, reassess following MRI imaging.  Patient continues to await MRI, complains of increasing pain which we will treat with IV morphine.  Patient turned over to oncoming divider pending MRI results and reassessment.      FINAL CLINICAL IMPRESSION(S) / ED DIAGNOSES   Final diagnoses:  Urinary incontinence, unspecified type  Acute midline thoracic back pain     Rx / DC Orders   ED Discharge Orders     None        Note:  This document was prepared using Dragon voice recognition software and may include unintentional dictation errors.   Chesley Noon, MD 05/28/22 1531

## 2022-05-28 NOTE — ED Triage Notes (Signed)
Pt ambulatory to triage in nad c/o urinary incontinence x 4-5 days. Pt also c/o middle back pain radiating down L leg x 4-5 days. Hx MS. Denies any falls/ injury

## 2022-05-28 NOTE — ED Notes (Signed)
Called MRI at this time, states they will be here at 2:00pm for MRI. MRI screening done.

## 2022-05-28 NOTE — ED Notes (Signed)
Pt transported to MRI 

## 2022-10-09 ENCOUNTER — Emergency Department: Payer: Managed Care, Other (non HMO)

## 2022-10-09 ENCOUNTER — Emergency Department
Admission: EM | Admit: 2022-10-09 | Discharge: 2022-10-09 | Disposition: A | Discharge status: Home / Self Care | Payer: Managed Care, Other (non HMO) | Attending: Emergency Medicine | Admitting: Emergency Medicine

## 2022-10-09 DIAGNOSIS — R7989 Other specified abnormal findings of blood chemistry: Secondary | ICD-10-CM | POA: Insufficient documentation

## 2022-10-09 DIAGNOSIS — J181 Lobar pneumonia, unspecified organism: Secondary | ICD-10-CM | POA: Diagnosis not present

## 2022-10-09 DIAGNOSIS — R109 Unspecified abdominal pain: Secondary | ICD-10-CM | POA: Diagnosis present

## 2022-10-09 DIAGNOSIS — J189 Pneumonia, unspecified organism: Secondary | ICD-10-CM

## 2022-10-09 LAB — COMPREHENSIVE METABOLIC PANEL
ALT: 22 U/L (ref 0–44)
AST: 33 U/L (ref 15–41)
Albumin: 4.7 g/dL (ref 3.5–5.0)
Alkaline Phosphatase: 46 U/L (ref 38–126)
Anion gap: 8 (ref 5–15)
BUN: 17 mg/dL (ref 6–20)
CO2: 25 mmol/L (ref 22–32)
Calcium: 9 mg/dL (ref 8.9–10.3)
Chloride: 103 mmol/L (ref 98–111)
Creatinine, Ser: 0.85 mg/dL (ref 0.61–1.24)
GFR, Estimated: >60 mL/min (ref >60)
Glucose, Bld: 140 mg/dL — ABNORMAL HIGH (ref 70–99)
Potassium: 4.4 mmol/L (ref 3.5–5.1)
Sodium: 136 mmol/L (ref 135–145)
Total Bilirubin: 0.7 mg/dL (ref 0.3–1.2)
Total Protein: 7.7 g/dL (ref 6.5–8.1)

## 2022-10-09 LAB — CBC
HCT: 40 % (ref 39.0–52.0)
Hemoglobin: 13.5 g/dL (ref 13.0–17.0)
MCH: 28.7 pg (ref 26.0–34.0)
MCHC: 33.8 g/dL (ref 30.0–36.0)
MCV: 85.1 fL (ref 80.0–100.0)
Platelets: 208 10*3/uL (ref 150–400)
RBC: 4.7 MIL/uL (ref 4.22–5.81)
RDW: 11.5 % (ref 11.5–15.5)
WBC: 6.2 10*3/uL (ref 4.0–10.5)
nRBC: 0 % (ref 0.0–0.2)

## 2022-10-09 LAB — URINALYSIS, ROUTINE W REFLEX MICROSCOPIC
Bilirubin Urine: NEGATIVE
Glucose, UA: NEGATIVE mg/dL
Hgb urine dipstick: NEGATIVE
Ketones, ur: NEGATIVE mg/dL
Leukocytes,Ua: NEGATIVE
Nitrite: NEGATIVE
Protein, ur: NEGATIVE mg/dL
Specific Gravity, Urine: 1.002 — ABNORMAL LOW (ref 1.005–1.030)
pH: 7 (ref 5.0–8.0)

## 2022-10-09 LAB — URINE DRUG SCREEN, QUALITATIVE (ARMC ONLY)
Amphetamines, Ur Screen: NOT DETECTED
Barbiturates, Ur Screen: NOT DETECTED
Benzodiazepine, Ur Scrn: NOT DETECTED
Cannabinoid 50 Ng, Ur ~~LOC~~: POSITIVE — AB
Cocaine Metabolite,Ur ~~LOC~~: NOT DETECTED
MDMA (Ecstasy)Ur Screen: NOT DETECTED
Methadone Scn, Ur: NOT DETECTED
Opiate, Ur Screen: NOT DETECTED
Phencyclidine (PCP) Ur S: NOT DETECTED
Tricyclic, Ur Screen: NOT DETECTED

## 2022-10-09 LAB — CK: Total CK: 805 U/L — ABNORMAL HIGH (ref 49–397)

## 2022-10-09 LAB — ETHANOL: Alcohol, Ethyl (B): <10 mg/dL (ref <10)

## 2022-10-09 MED ORDER — AMOXICILLIN 500 MG PO CAPS: 1000.0000 mg | ORAL_CAPSULE | Freq: Three times a day (TID) | ORAL | 0 refills | Status: DC

## 2022-10-09 MED ORDER — AZITHROMYCIN 500 MG PO TABS
500.0000 mg | ORAL_TABLET | Freq: Every day | ORAL | Status: DC
  Administered 2022-10-09: 500 mg via ORAL
  Filled 2022-10-09: qty 1

## 2022-10-09 MED ORDER — AMOXICILLIN 500 MG PO CAPS
1000.0000 mg | ORAL_CAPSULE | Freq: Once | ORAL | Status: AC
  Administered 2022-10-09: 1000 mg via ORAL
  Filled 2022-10-09: qty 2

## 2022-10-09 MED ORDER — KETOROLAC TROMETHAMINE 30 MG/ML IJ SOLN
30.0000 mg | Freq: Once | INTRAMUSCULAR | Status: AC
  Administered 2022-10-09: 30 mg via INTRAMUSCULAR
  Filled 2022-10-09: qty 1

## 2022-10-09 MED ORDER — AMOXICILLIN 500 MG PO CAPS: 1000.0000 mg | ORAL_CAPSULE | Freq: Three times a day (TID) | ORAL | 0 refills | Status: AC

## 2022-10-09 MED ORDER — AZITHROMYCIN 250 MG PO TABS: ORAL_TABLET | ORAL | 0 refills | Status: AC

## 2022-10-09 NOTE — ED Provider Notes (Signed)
[facility] Provider Note    Event Date/Time   First MD Initiated Contact with Patient 10/09/22 1851     (approximate)   History  Flank pain   HPI  Damon Blair is a 32 y.o. male with a history of substance abuse who presents with complaints of left flank pain.  Patient is concerned that he has a kidney stone which he has had in the past.  He reports he has MS which causes bladder spasms and possibly puts him at risk for urinary tract infections as well.  He does report that he has been abusing his narcotics from his pain management center.  He is not here for opioid detox, he is concerned about possibility of a kidney stone.  No fevers, no new neurological deficits     Physical Exam   Triage Vital Signs: ED Triage Vitals  Enc Vitals Group     BP 10/09/22 1837 (!) 170/106     Pulse Rate 10/09/22 1837 (!) 105     Resp 10/09/22 1837 17     Temp 10/09/22 1837 98.1 F (36.7 C)     Temp Source 10/09/22 1837 Oral     SpO2 10/09/22 1837 100 %     Weight 10/09/22 1838 81.6 kg (180 lb)     Height --      Head Circumference --      Peak Flow --      Pain Score 10/09/22 1838 0     Pain Loc --      Pain Edu? --      Excl. in [place]? --     Most recent vital signs: Vitals:   10/09/22 2000 10/09/22 2005  BP:  128/82  Pulse:  75  Resp:  18  Temp: 98.5 F (36.9 C)   SpO2:  99%     General: Awake, no distress.  CV:  Good peripheral perfusion.  Resp:  Normal effort.  Abd:  No distention.  Other:     ED Results / Procedures / Treatments   Labs (all labs ordered are listed, but only abnormal results are displayed) Labs Reviewed  COMPREHENSIVE METABOLIC PANEL - Abnormal; Notable for the following components:      Result Value   Glucose, Bld 140 (*)    All other components within normal limits  URINE DRUG SCREEN, QUALITATIVE ([facility] ONLY) - Abnormal; Notable for the following components:   Cannabinoid 50 Ng, Ur [place] POSITIVE (*)    All other  components within normal limits  URINALYSIS, ROUTINE W REFLEX MICROSCOPIC - Abnormal; Notable for the following components:   Color, Urine COLORLESS (*)    APPearance CLEAR (*)    Specific Gravity, Urine 1.002 (*)    All other components within normal limits  CK - Abnormal; Notable for the following components:   Total CK 805 (*)    All other components within normal limits  ETHANOL  CBC     EKG     RADIOLOGY CT scan viewed interpreted me, no ureterolithiasis    PROCEDURES:  Critical Care performed:   Procedures   MEDICATIONS ORDERED IN ED: Medications  azithromycin (ZITHROMAX) tablet 500 mg (500 mg Oral Given 10/09/22 2004)  ketorolac (TORADOL) 30 MG/ML injection 30 mg (30 mg Intramuscular Given 10/09/22 1914)  amoxicillin (AMOXIL) capsule 1,000 mg (1,000 mg Oral Given 10/09/22 2004)     IMPRESSION / MDM / [place] / ED COURSE  I reviewed the triage vital signs and the  nursing notes. Patient's presentation is most consistent with acute presentation with potential threat to life or bodily function.  Patient ports a history of kidney stone with UTI which left him very ill in the past.  Differential includes kidney stone, UTI, pyelonephritis  Obtain labs, give IM Toradol, CT renal stone study, check urinalysis.  CT scan is negative for ureterolithiasis however there is evidence of left lower lobe pneumonia  Lab work reviewed and is overall reassuring, mildly elevated CK, normal urine, not consistent with rhabdo  Patient with reassuring vitals labs appropriate for outpatient treatment, antibiotic prescription provided      FINAL CLINICAL IMPRESSION(S) / ED DIAGNOSES   Final diagnoses:  Community acquired pneumonia of left lower lobe of lung     Rx / DC Orders   ED Discharge Orders          Ordered    amoxicillin (AMOXIL) 500 MG capsule  3 times daily,   Status:  Discontinued        10/09/22 1958    azithromycin (ZITHROMAX Z-PAK) 250 MG tablet         10/09/22 1958    amoxicillin (AMOXIL) 500 MG capsule  3 times daily        10/09/22 1959             Note:  This document was prepared using Dragon voice recognition software and may include unintentional dictation errors.   Lavonia Drafts, MD 10/09/22 2014

## 2022-10-09 NOTE — ED Notes (Signed)
Multiple RN witnessed pt walk out of triage and get into car and drive away, rooms available. This RN contacted pt via phone number listed, pt states "went down the street", informed pt he needs to be present in lobby. Pt states on the way back

## 2022-10-09 NOTE — ED Triage Notes (Signed)
Pt sts that he has been having lower left back pain. Pt also sts that he has been crushing up the dilaudid pills that he gets from the pain clinic and shoot them up intervenously. Pt sts that he has MS which doesn't help but he is also having bladder problems.

## 2022-10-09 NOTE — ED Notes (Signed)
See triage note. Pt states he's been having lower left back pain. Has been crushing pain pills he gets from clinic and taking them IV.

## 2022-12-02 ENCOUNTER — Emergency Department: Payer: Managed Care, Other (non HMO)

## 2022-12-02 ENCOUNTER — Emergency Department
Admission: EM | Admit: 2022-12-02 | Discharge: 2022-12-02 | Disposition: A | Discharge status: Home / Self Care | Payer: Managed Care, Other (non HMO) | Attending: Emergency Medicine | Admitting: Emergency Medicine

## 2022-12-02 ENCOUNTER — Other Ambulatory Visit: Payer: Self-pay

## 2022-12-02 DIAGNOSIS — R531 Weakness: Secondary | ICD-10-CM | POA: Insufficient documentation

## 2022-12-02 DIAGNOSIS — R519 Headache, unspecified: Secondary | ICD-10-CM | POA: Insufficient documentation

## 2022-12-02 DIAGNOSIS — R109 Unspecified abdominal pain: Secondary | ICD-10-CM | POA: Diagnosis present

## 2022-12-02 DIAGNOSIS — R1032 Left lower quadrant pain: Secondary | ICD-10-CM | POA: Diagnosis not present

## 2022-12-02 LAB — URINALYSIS, ROUTINE W REFLEX MICROSCOPIC
Bilirubin Urine: NEGATIVE
Glucose, UA: NEGATIVE mg/dL
Hgb urine dipstick: NEGATIVE
Ketones, ur: NEGATIVE mg/dL
Leukocytes,Ua: NEGATIVE
Nitrite: NEGATIVE
Protein, ur: NEGATIVE mg/dL
Specific Gravity, Urine: 1.018 (ref 1.005–1.030)
pH: 7 (ref 5.0–8.0)

## 2022-12-02 LAB — CBC
HCT: 38.9 % — ABNORMAL LOW (ref 39.0–52.0)
Hemoglobin: 13.1 g/dL (ref 13.0–17.0)
MCH: 28.4 pg (ref 26.0–34.0)
MCHC: 33.7 g/dL (ref 30.0–36.0)
MCV: 84.4 fL (ref 80.0–100.0)
Platelets: 235 10*3/uL (ref 150–400)
RBC: 4.61 MIL/uL (ref 4.22–5.81)
RDW: 12.1 % (ref 11.5–15.5)
WBC: 6.7 10*3/uL (ref 4.0–10.5)
nRBC: 0 % (ref 0.0–0.2)

## 2022-12-02 LAB — BASIC METABOLIC PANEL
Anion gap: 9 (ref 5–15)
BUN: 11 mg/dL (ref 6–20)
CO2: 28 mmol/L (ref 22–32)
Calcium: 9.4 mg/dL (ref 8.9–10.3)
Chloride: 103 mmol/L (ref 98–111)
Creatinine, Ser: 0.91 mg/dL (ref 0.61–1.24)
GFR, Estimated: >60 mL/min (ref >60)
Glucose, Bld: 116 mg/dL — ABNORMAL HIGH (ref 70–99)
Potassium: 3.6 mmol/L (ref 3.5–5.1)
Sodium: 140 mmol/L (ref 135–145)

## 2022-12-02 MED ORDER — SODIUM CHLORIDE 0.9 % IV BOLUS
1000.0000 mL | Freq: Once | INTRAVENOUS | Status: AC
  Administered 2022-12-02: 1000 mL via INTRAVENOUS

## 2022-12-02 MED ORDER — DIAZEPAM 5 MG/ML IJ SOLN
5.0000 mg | Freq: Once | INTRAMUSCULAR | Status: AC
  Administered 2022-12-02: 5 mg via INTRAVENOUS
  Filled 2022-12-02: qty 2

## 2022-12-02 MED ORDER — MORPHINE SULFATE (PF) 4 MG/ML IV SOLN
4.0000 mg | Freq: Once | INTRAVENOUS | Status: AC
  Administered 2022-12-02: 4 mg via INTRAVENOUS
  Filled 2022-12-02: qty 1

## 2022-12-02 MED ORDER — KETOROLAC TROMETHAMINE 15 MG/ML IJ SOLN
15.0000 mg | Freq: Once | INTRAMUSCULAR | Status: AC
  Administered 2022-12-02: 15 mg via INTRAVENOUS
  Filled 2022-12-02: qty 1

## 2022-12-02 NOTE — ED Provider Notes (Signed)
[facility] Provider Note    Event Date/Time   First MD Initiated Contact with Patient 12/02/22 1712     (approximate)   History   Chief Complaint: Weakness (X 1 week) and Flank Pain (LEFT sided)   HPI  Damon Blair is a 32 y.o. male with a past history of multiple sclerosis, recurrent UTIs who comes ED complaining of increased bilateral leg weakness for the past week, constant, no aggravating or alleviating factors.  Also having left-sided flank pain for the past 24 hours that feels like a kidney stone.  Reports continued urinary incontinence.  No fever chest pain shortness of breath.  Reports that he is on controller medication for MS and has been in remission for the last 2 years.  He does have some increased headache currently, but denies any blurry vision or other vision changes.     Physical Exam   Triage Vital Signs: ED Triage Vitals  Enc Vitals Group     BP 12/02/22 1704 (!) 147/101     Pulse Rate 12/02/22 1704 94     Resp 12/02/22 1704 14     Temp 12/02/22 1704 98 F (36.7 C)     Temp Source 12/02/22 1704 Oral     SpO2 12/02/22 1704 96 %     Weight 12/02/22 1705 190 lb (86.2 kg)     Height 12/02/22 1705 5\' 9"  (1.753 m)     Head Circumference --      Peak Flow --      Pain Score 12/02/22 1705 6     Pain Loc --      Pain Edu? --      Excl. in GC? --     Most recent vital signs: Vitals:   12/02/22 1830 12/02/22 1900  BP: 125/69 116/79  Pulse:  88  Resp:  16  Temp:    SpO2:  97%    General: Awake, no distress.  CV:  Good peripheral perfusion.  Regular rate and rhythm Resp:  Normal effort.  Clear to auscultation bilaterally Abd:  No distention.  Soft with left lower quadrant and left flank tenderness.  No CVA tenderness Other:  Cranial nerves II through XII intact.  No nystagmus or anisocoria.  Good strength in all 4 extremities, no drift, intact sensation, intact coordination   ED Results / Procedures / Treatments    Labs (all labs ordered are listed, but only abnormal results are displayed) Labs Reviewed  URINALYSIS, ROUTINE W REFLEX MICROSCOPIC - Abnormal; Notable for the following components:      Result Value   Color, Urine YELLOW (*)    APPearance CLOUDY (*)    All other components within normal limits  BASIC METABOLIC PANEL - Abnormal; Notable for the following components:   Glucose, Bld 116 (*)    All other components within normal limits  CBC - Abnormal; Notable for the following components:   HCT 38.9 (*)    All other components within normal limits     EKG    RADIOLOGY CT abdomen pelvis interpreted by me, negative for ureterolithiasis.  Radiology report reviewed  MRI brain and C/T/L-spine pending   PROCEDURES:  Procedures   MEDICATIONS ORDERED IN ED: Medications  sodium chloride 0.9 % bolus 1,000 mL (0 mLs Intravenous Stopped 12/02/22 1852)  ketorolac (TORADOL) 15 MG/ML injection 15 mg (15 mg Intravenous Given 12/02/22 1749)  diazepam (VALIUM) injection 5 mg (5 mg Intravenous Given 12/02/22 2119)  morphine (PF) 4 MG/ML injection  4 mg (4 mg Intravenous Given 12/02/22 2036)     IMPRESSION / MDM / ASSESSMENT AND PLAN / ED COURSE  I reviewed the triage vital signs and the nursing notes.  DDx: Ureterolithiasis, cystitis, AKI, spinal discitis, cauda equina syndrome, MS flare, spinal herniated disc, compression fracture  Patient's presentation is most consistent with acute presentation with potential threat to life or bodily function.  Patient presents with increased low back pain as well as headache and left flank pain with associated increase in lower extremity weakness.  No signs of stroke.  Mental status is normal.  Will obtain labs and CT abdomen pelvis.   Clinical Course as of 12/02/22 2340  Sat Dec 02, 2022  2024 Labs and CT abdomen pelvis overall unremarkable, not diagnostic for his worsening symptoms.  Will repeat MRI of the brain and total spine to evaluate for  worsened MS lesions, cauda equina, compression fracture, or infectious complication.  No evidence of sepsis or meningitis [PS]    Clinical Course User Index [PS] Sharman Cheek, MD    ----------------------------------------- 11:40 PM on 12/02/2022 ----------------------------------------- MRI unremarkable. Stable for DC   FINAL CLINICAL IMPRESSION(S) / ED DIAGNOSES   Final diagnoses:  Left flank pain     Rx / DC Orders   ED Discharge Orders     None        Note:  This document was prepared using Dragon voice recognition software and may include unintentional dictation errors.   Sharman Cheek, MD 12/02/22 [phone]

## 2022-12-02 NOTE — ED Triage Notes (Signed)
Pt to ED from home for weakness x 1 week and left sided flank pain x 24 hours. Pt has HX of kidney stones. Pt has HX of MS as well. Pt is CAOx4 and in no acute distress and ambulatory in triage.

## 2023-01-29 ENCOUNTER — Emergency Department: Payer: Managed Care, Other (non HMO)

## 2023-01-29 ENCOUNTER — Other Ambulatory Visit: Payer: Self-pay

## 2023-01-29 ENCOUNTER — Encounter: Payer: Self-pay | Admitting: Emergency Medicine

## 2023-01-29 ENCOUNTER — Emergency Department
Admission: EM | Admit: 2023-01-29 | Discharge: 2023-01-29 | Disposition: A | Discharge status: Home / Self Care | Payer: Managed Care, Other (non HMO) | Attending: Emergency Medicine | Admitting: Emergency Medicine

## 2023-01-29 DIAGNOSIS — R1012 Left upper quadrant pain: Secondary | ICD-10-CM | POA: Diagnosis not present

## 2023-01-29 DIAGNOSIS — R109 Unspecified abdominal pain: Secondary | ICD-10-CM

## 2023-01-29 HISTORY — DX: Calculus of kidney: N20.0

## 2023-01-29 LAB — COMPREHENSIVE METABOLIC PANEL
ALT: 16 U/L (ref 0–44)
AST: 18 U/L (ref 15–41)
Albumin: 4.8 g/dL (ref 3.5–5.0)
Alkaline Phosphatase: 45 U/L (ref 38–126)
Anion gap: 8 (ref 5–15)
BUN: 12 mg/dL (ref 6–20)
CO2: 27 mmol/L (ref 22–32)
Calcium: 9.5 mg/dL (ref 8.9–10.3)
Chloride: 101 mmol/L (ref 98–111)
Creatinine, Ser: 0.81 mg/dL (ref 0.61–1.24)
GFR, Estimated: >60 mL/min (ref >60)
Glucose, Bld: 97 mg/dL (ref 70–99)
Potassium: 4.1 mmol/L (ref 3.5–5.1)
Sodium: 136 mmol/L (ref 135–145)
Total Bilirubin: 0.8 mg/dL (ref 0.3–1.2)
Total Protein: 7.8 g/dL (ref 6.5–8.1)

## 2023-01-29 LAB — URINALYSIS, ROUTINE W REFLEX MICROSCOPIC
Bilirubin Urine: NEGATIVE
Glucose, UA: NEGATIVE mg/dL
Hgb urine dipstick: NEGATIVE
Ketones, ur: NEGATIVE mg/dL
Leukocytes,Ua: NEGATIVE
Nitrite: NEGATIVE
Protein, ur: NEGATIVE mg/dL
Specific Gravity, Urine: 1.005 (ref 1.005–1.030)
pH: 6 (ref 5.0–8.0)

## 2023-01-29 LAB — CBC
HCT: 39.2 % (ref 39.0–52.0)
Hemoglobin: 13.5 g/dL (ref 13.0–17.0)
MCH: 29.3 pg (ref 26.0–34.0)
MCHC: 34.4 g/dL (ref 30.0–36.0)
MCV: 85 fL (ref 80.0–100.0)
Platelets: 226 10*3/uL (ref 150–400)
RBC: 4.61 MIL/uL (ref 4.22–5.81)
RDW: 11.7 % (ref 11.5–15.5)
WBC: 5.9 10*3/uL (ref 4.0–10.5)
nRBC: 0 % (ref 0.0–0.2)

## 2023-01-29 NOTE — ED Provider Notes (Signed)
[facility] Emergency Department Provider Note     Event Date/Time   First MD Initiated Contact with Patient 01/29/23 1525     (approximate)   History   Flank Pain   HPI  Damon Blair is a 32 y.o. male with a history of MS presents to the ED with complaint of left flank pain progressively worsening x 2 days. Patient reports waxing and waning pain described as stabbing and is worsened with movement for a couple of months.  Pain score 5/10.  Has tried Tylenol with out any relief.  Patient has a history of kidney stones.  Patient has concerns that last time as the symptoms he was diagnosed with pneumonia.  Patient endorses cough. No other complaints. Denies fever and chills, chest pain and shortness of breath..    Physical Exam   Triage Vital Signs: ED Triage Vitals  Encounter Vitals Group     BP 01/29/23 1507 (!) 132/92     Systolic BP Percentile --      Diastolic BP Percentile --      Pulse Rate 01/29/23 1507 86     Resp 01/29/23 1507 16     Temp 01/29/23 1507 98.4 F (36.9 C)     Temp Source 01/29/23 1507 Oral     SpO2 01/29/23 1507 100 %     Weight 01/29/23 1505 190 lb 0.6 oz (86.2 kg)     Height 01/29/23 1505 5\' 9"  (1.753 m)     Head Circumference --      Peak Flow --      Pain Score 01/29/23 1504 5     Pain Loc --      Pain Education --      Exclude from Growth Chart --     Most recent vital signs: Vitals:   01/29/23 1507  BP: (!) 132/92  Pulse: 86  Resp: 16  Temp: 98.4 F (36.9 C)  SpO2: 100%    General Awake, no distress.  Well-appearing. HEENT NCAT. PERRL. EOMI.  CV:  Good peripheral perfusion.  RRR RESP:  Normal effort. LCTAB ABD:  No distention.  LUQ tenderness. Left flank pain. (-) CVA tenderness.   ED Results / Procedures / Treatments   Labs (all labs ordered are listed, but only abnormal results are displayed) Labs Reviewed  URINALYSIS, ROUTINE W REFLEX MICROSCOPIC - Abnormal; Notable for the following  components:      Result Value   Color, Urine YELLOW (*)    APPearance CLEAR (*)    All other components within normal limits  CBC  COMPREHENSIVE METABOLIC PANEL   RADIOLOGY  I personally viewed and evaluated these images as part of my medical decision making, as well as reviewing the written report by the radiologist.  ED Provider Interpretation: Chest x-ray is unremarkable.  DG Chest 2 View  Result Date: 01/29/2023 CLINICAL DATA:  Left flank pain with cough. EXAM: CHEST - 2 VIEW COMPARISON:  12/24/2021 and CT chest 01/21/2022. FINDINGS: Trachea is midline. Heart size normal. Lungs are clear. No pleural fluid. Nipple shadows project over the lower hemithoraces. IMPRESSION: No acute findings. Electronically Signed   By: Leanna Battles M.D.   On: 01/29/2023 16:06    PROCEDURES:  Critical Care performed: No  Procedures  MEDICATIONS ORDERED IN ED: Medications - No data to display  IMPRESSION / MDM / ASSESSMENT AND PLAN / ED COURSE  I reviewed the triage vital signs and the nursing notes.  32 y.o. male presents to the emergency department for evaluation and treatment of left flank pain. See HPI for further details.   Differential diagnosis includes, but is not limited to kidney stone, pneumonia, dehydration, kidney infection.  Lab work ordered and reviewed revealing CBC, CMP unremarkable.  Urinalysis unremarkable.  No hematuria. Discussed exam findings are overall benign.  This is reassuring.  No CVA suspecting stone or infection. A chest x-ray ordered revealing unremarkable acute findings.   Patient is in satisfactory and stable condition for discharge and outpatient follow up. Patient is to follow up with his primary care as needed or otherwise directed. Patient is given ED precautions to return to the ED for any worsening or new symptoms. Patient verbalizes understanding. All questions and concerns were addressed during ED visit.    Patient's  presentation is most consistent with acute complicated illness / injury requiring diagnostic workup.  FINAL CLINICAL IMPRESSION(S) / ED DIAGNOSES   Final diagnoses:  Left flank pain   Rx / DC Orders   ED Discharge Orders     None      Note:  This document was prepared using Dragon voice recognition software and may include unintentional dictation errors.    Romeo Apple, Shaya Altamura A, PA-C 01/29/23 1706    Jene Every, MD 01/29/23 1950

## 2023-01-29 NOTE — Discharge Instructions (Addendum)
Your urine and blood work are normal.  If symptoms continue please follow-up with your primary care in 3 days.  Consider scheduling a appointment with your urologist.

## 2023-01-29 NOTE — ED Triage Notes (Signed)
C/O left flank pain, difficulty urinating, dark urine x 48 hours.  Has had similar symptoms in the past, and had pneumonia.

## 2023-01-30 ENCOUNTER — Telehealth: Payer: Self-pay

## 2023-01-30 NOTE — Telephone Encounter (Signed)
Patient left a message on triage line stating that he has been having left flank pain the last few days and in the past 2 days has gotten worse. He went to ER yesterday but they did not get Abdomen Xray. He has hx of kidney stones. First available appointment  with PA is on Friday at this time. OK to wait until then or any other suggestions?

## 2023-01-30 NOTE — Telephone Encounter (Signed)
Patient advised and soonest available now per Sam is 02/07/23. Patient advised and scheduled. Patient was advised to let us know if symptoms get worse or has new onset of symptoms

## 2023-01-30 NOTE — Telephone Encounter (Signed)
duplicate

## 2023-01-30 NOTE — Telephone Encounter (Signed)
Chart was reviewed.  He had a CT May 2024 which showed no renal or ureteral calculi.  Stone in this short time since CT is unlikely.  Urinalysis was unremarkable.  Okay to wait until PA appointment

## 2023-02-07 ENCOUNTER — Encounter: Payer: Self-pay | Admitting: Physician Assistant

## 2023-02-07 ENCOUNTER — Ambulatory Visit: Payer: Managed Care, Other (non HMO) | Admitting: Physician Assistant

## 2023-02-07 ENCOUNTER — Ambulatory Visit
Admission: RE | Admit: 2023-02-07 | Discharge: 2023-02-07 | Disposition: A | Discharge status: Home / Self Care | Payer: Managed Care, Other (non HMO) | Source: Ambulatory Visit | Attending: Physician Assistant | Admitting: Physician Assistant

## 2023-02-07 VITALS — BP 131/88 | HR 87 | Temp 98.3°F | Ht 69.0 in | Wt 173.0 lb

## 2023-02-07 DIAGNOSIS — Z87442 Personal history of urinary calculi: Secondary | ICD-10-CM

## 2023-02-07 DIAGNOSIS — R109 Unspecified abdominal pain: Secondary | ICD-10-CM | POA: Diagnosis not present

## 2023-02-07 LAB — URINALYSIS, COMPLETE
Bilirubin, UA: NEGATIVE
Glucose, UA: NEGATIVE
Ketones, UA: NEGATIVE
Leukocytes,UA: NEGATIVE
Nitrite, UA: NEGATIVE
Protein,UA: NEGATIVE
RBC, UA: NEGATIVE
Specific Gravity, UA: 1.01 (ref 1.005–1.030)
Urobilinogen, Ur: 0.2 mg/dL (ref 0.2–1.0)
pH, UA: 7 (ref 5.0–7.5)

## 2023-02-07 LAB — MICROSCOPIC EXAMINATION
Bacteria, UA: NONE SEEN
RBC, Urine: NONE SEEN /hpf (ref 0–2)

## 2023-02-07 NOTE — Progress Notes (Signed)
02/07/2023 5:06 PM   Delynn Flavin 01-Jan-1991 [id number]  CC: Chief Complaint  Patient presents with   Flank Pain   HPI: Cecil Vandyke is a 32 y.o. male with PMH MS and nephrolithiasis who presents today for evaluation of left flank pain.   He sought care in the ED 9 days ago for the same with reports of left flank pain, difficulty urinating, and dark urine x 48 hours.  UA was bland, creatinine at baseline, no leukocytosis.  A chest x-ray was performed, but no abdominal imaging.  Today he reports his left flank pain has persisted, though worsened over the last 1 to 2 days.  He describes it as 7-8/10 in intensity, nearly constant, and stabbing/burning in his left flank.  It is associated with chest tightness, chills, dry heaves, diarrhea, and headache.  He denies sick contacts, fever, and gross hematuria.  Notably, he reports that he had similar flank pain in March of this year and was diagnosed with pneumonia at the time.  Notably, he had a CT stone study on 12/02/2022 which showed no evidence of hydronephrosis or urolithiasis.  Possible duplicated collecting system on the left.  In-office UA and microscopy today pan negative. PVR 0mL.  PMH: Past Medical History:  Diagnosis Date   Kidney stones    MS (multiple sclerosis) (HCC)     Surgical History: Past Surgical History:  Procedure Laterality Date   CYSTOSCOPY W/ RETROGRADES  11/18/2021   Procedure: CYSTOSCOPY WITH RETROGRADE PYELOGRAM;  Surgeon: Sondra Come, MD;  Location: [facility];  Service: Urology;;   CYSTOSCOPY/URETEROSCOPY/HOLMIUM LASER/STENT PLACEMENT Left 11/18/2021   Procedure: CYSTOSCOPY/URETEROSCOPY/HOLMIUM LASER/STENT PLACEMENT;  Surgeon: Sondra Come, MD;  Location: [facility];  Service: Urology;  Laterality: Left;   FRACTURE SURGERY Left     Home Medications:  Allergies as of 02/07/2023       Reactions   Clavulanic Acid    Dilaudid [hydromorphone]    Oxycodone Itching        Medication  List        Accurate as of February 07, 2023  5:06 PM. If you have any questions, ask your nurse or doctor.          STOP taking these medications    dronabinol 5 MG capsule Commonly known as: MARINOL Stopped by: Lelon Mast Bertie Simien   ondansetron 4 MG tablet Commonly known as: ZOFRAN Stopped by: Carman Ching   oxybutynin 5 MG tablet Commonly known as: DITROPAN Stopped by: Carman Ching   oxyCODONE-acetaminophen 5-325 MG tablet Commonly known as: Percocet Stopped by: Carman Ching   tamsulosin 0.4 MG Caps capsule Commonly known as: FLOMAX Stopped by: Carman Ching       TAKE these medications    amphetamine-dextroamphetamine 5 MG tablet Commonly known as: ADDERALL Take 5 mg by mouth daily.   baclofen 20 MG tablet Commonly known as: LIORESAL Take 20 mg by mouth 3 (three) times daily.   cetirizine 10 MG tablet Commonly known as: ZYRTEC Take 10 mg by mouth as needed.   diazepam 5 MG tablet Commonly known as: VALIUM Take 5 mg by mouth as needed for anxiety.   dicyclomine 20 MG tablet Commonly known as: BENTYL Take 1 tablet (20 mg total) by mouth every 8 (eight) hours as needed for spasms (Abdominal cramping).   Glatiramer Acetate 40 MG/ML Sosy Inject into the skin.   imiquimod 5 % cream Commonly known as: ALDARA SMARTSIG:1 Packet(s) Topical 3 Times a Week   ondansetron 4 MG disintegrating tablet Commonly  known as: ZOFRAN-ODT Take 1 tablet (4 mg total) by mouth every 6 (six) hours as needed for nausea or vomiting.   pregabalin 200 MG capsule Commonly known as: LYRICA Take 200 mg by mouth in the morning, at noon, and at bedtime.        Allergies:  Allergies  Allergen Reactions   Clavulanic Acid    Dilaudid [Hydromorphone]    Oxycodone Itching    Family History: History reviewed. No pertinent family history.  Social History:   reports that he has quit smoking. His smoking use included cigarettes. He has  never been exposed to tobacco smoke. He has quit using smokeless tobacco.  His smokeless tobacco use included snuff. He reports that he does not currently use alcohol. He reports that he does not currently use drugs.  Physical Exam: BP 131/88   Pulse 87   Temp 98.3 F (36.8 C)   Ht 5\' 9"  (1.753 m)   Wt 173 lb (78.5 kg)   BMI 25.55 kg/m   Constitutional:  Alert and oriented, uncomfortable appearing, gripping his left flank HEENT: [place], AT Cardiovascular: No clubbing, cyanosis, or edema Respiratory: Normal respiratory effort, no increased work of breathing Skin: No rashes, bruises or suspicious lesions Neurologic: Grossly intact, no focal deficits, moving all 4 extremities Psychiatric: Normal mood and affect  Laboratory Data: Results for orders placed or performed in visit on 02/07/23  Microscopic Examination   Urine  Result Value Ref Range   WBC, UA 0-5 0 - 5 /hpf   RBC, Urine None seen 0 - 2 /hpf   Epithelial Cells (non renal) 0-10 0 - 10 /hpf   Bacteria, UA None seen None seen/Few  Urinalysis, Complete  Result Value Ref Range   Specific Gravity, UA 1.010 1.005 - 1.030   pH, UA 7.0 5.0 - 7.5   Color, UA Yellow Yellow   Appearance Ur Clear Clear   Leukocytes,UA Negative Negative   Protein,UA Negative Negative/Trace   Glucose, UA Negative Negative   Ketones, UA Negative Negative   RBC, UA Negative Negative   Bilirubin, UA Negative Negative   Urobilinogen, Ur 0.2 0.2 - 1.0 mg/dL   Nitrite, UA Negative Negative   Microscopic Examination See below:    Assessment & Plan:   1. Flank pain with history of urolithiasis Moderate to severe left flank pain.  UA is bland and he had a negative CT stone study 2 months ago.  I think it would be extremely likely that he would have grown a new stone in the past 2 months and be passing it now.  I offered him a stat renal ultrasound today.  If positive for hydronephrosis, will pursue repeat CT stone study.  If negative, I recommend following  up with PCP for evaluation of possible pulmonary sources of his pain, as we discussed that lower thorax pain can localize to the upper flank.  He is in agreement with this plan. - Urinalysis, Complete - BLADDER SCAN AMB NON-IMAGING - US RENAL; Future   Return for Will call with results.  Carman Ching, PA-C  [facility] [place] [address] [place], [place] [address] [phone]

## 2023-02-16 ENCOUNTER — Ambulatory Visit
Admission: RE | Admit: 2023-02-16 | Discharge: 2023-02-16 | Disposition: A | Discharge status: Home / Self Care | Payer: Managed Care, Other (non HMO) | Source: Ambulatory Visit | Attending: Internal Medicine | Admitting: Internal Medicine

## 2023-02-16 ENCOUNTER — Other Ambulatory Visit: Payer: Self-pay | Admitting: Internal Medicine

## 2023-02-16 DIAGNOSIS — R0602 Shortness of breath: Secondary | ICD-10-CM

## 2023-02-16 DIAGNOSIS — R0781 Pleurodynia: Secondary | ICD-10-CM

## 2023-02-16 MED ORDER — IOPAMIDOL (ISOVUE-370) INJECTION 76%
75.0000 mL | Freq: Once | INTRAVENOUS | Status: AC | PRN
  Administered 2023-02-16: 75 mL via INTRAVENOUS

## 2023-03-30 ENCOUNTER — Encounter: Payer: Self-pay | Admitting: Emergency Medicine

## 2023-03-30 ENCOUNTER — Ambulatory Visit
Admission: EM | Admit: 2023-03-30 | Discharge: 2023-03-30 | Disposition: A | Discharge status: Home / Self Care | Payer: Managed Care, Other (non HMO) | Attending: Family Medicine | Admitting: Family Medicine

## 2023-03-30 ENCOUNTER — Other Ambulatory Visit: Payer: Self-pay

## 2023-03-30 ENCOUNTER — Ambulatory Visit (INDEPENDENT_AMBULATORY_CARE_PROVIDER_SITE_OTHER): Payer: Managed Care, Other (non HMO)

## 2023-03-30 DIAGNOSIS — M25532 Pain in left wrist: Secondary | ICD-10-CM

## 2023-03-30 DIAGNOSIS — J029 Acute pharyngitis, unspecified: Secondary | ICD-10-CM

## 2023-03-30 DIAGNOSIS — R0981 Nasal congestion: Secondary | ICD-10-CM

## 2023-03-30 MED ORDER — NAPROXEN 375 MG PO TABS: 375.0000 mg | ORAL_TABLET | Freq: Two times a day (BID) | ORAL | 0 refills | Status: DC

## 2023-03-30 NOTE — ED Provider Notes (Signed)
[name]    CSN: [id number] Arrival date & time: 03/30/23  1910      History   Chief Complaint No chief complaint on file.   HPI Damon Blair is a 32 y.o. male.   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   ADHD (attention deficit hyperactivity disorder), combined type 12/22/2015   Continuous cannabis use 12/22/2015   Acute upper respiratory infection 03/01/2015   Need for diphtheria-tetanus-pertussis (Tdap) vaccine 03/01/2015   Open wound of left hand 03/01/2015   Acute pharyngitis 03/01/2015    Past Surgical History:  Procedure Laterality Date   CYSTOSCOPY W/ RETROGRADES  11/18/2021   Procedure: CYSTOSCOPY WITH RETROGRADE PYELOGRAM;  Surgeon: Sondra Come, MD;  Location: [facility];  Service: Urology;;   CYSTOSCOPY/URETEROSCOPY/HOLMIUM LASER/STENT PLACEMENT Left 11/18/2021   Procedure: CYSTOSCOPY/URETEROSCOPY/HOLMIUM LASER/STENT PLACEMENT;  Surgeon: Sondra Come, MD;  Location: [facility];  Service: Urology;  Laterality: Left;   FRACTURE SURGERY Left        Home Medications    Prior to Admission medications   Medication Sig Start Date End Date Taking? Authorizing Provider  amphetamine-dextroamphetamine (ADDERALL) 5 MG tablet Take 5 mg by mouth daily. 08/30/20   [provider]  baclofen (LIORESAL) 20 MG tablet Take 20 mg by mouth 3 (three) times daily.    [provider]  cetirizine  (ZYRTEC) 10 MG tablet Take 10 mg by mouth as needed.    [provider]  diazepam (VALIUM) 5 MG tablet Take 5 mg by mouth as needed for anxiety.    [provider]  dicyclomine (BENTYL) 20 MG tablet Take 1 tablet (20 mg total) by mouth every 8 (eight) hours as needed for spasms (Abdominal cramping). 01/21/22   Ward, Layla Maw, DO  Glatiramer Acetate 40 MG/ML SOSY Inject into the skin. 07/23/20   [provider]  imiquimod Mathis Dad) 5 % cream SMARTSIG:1 Packet(s) Topical 3 Times a Week 10/24/21   [provider]  ondansetron (ZOFRAN-ODT) 4 MG disintegrating tablet Take 1 tablet (4 mg total) by mouth every 6 (six) hours as needed for nausea or vomiting. 01/21/22   Ward, Layla Maw, DO  pregabalin (LYRICA) 200 MG capsule Take 200 mg by mouth in the morning, at noon, and at bedtime.    [provider]    Family History No family history on file.  Social History Social History   Tobacco Use   Smoking status: Former    Types: Cigarettes    Passive exposure: Never   Smokeless tobacco: Former    Types: Snuff  Vaping Use   Vaping status: Former  Substance Use Topics   Alcohol use: Not Currently   Drug use: Not Currently     Allergies   Clavulanic acid, Dilaudid [hydromorphone], and Oxycodone   Review of Systems Review of Systems   Physical Exam Triage Vital Signs ED Triage Vitals  Encounter Vitals Group     BP  Systolic BP Percentile      Diastolic BP Percentile      Pulse      Resp      Temp      Temp src      SpO2      Weight      Height      Head Circumference      Peak Flow      Pain Score      Pain Loc      Pain Education      Exclude from Growth Chart    No data found.  Updated Vital Signs There were no vitals taken for this visit.  Visual Acuity Right Eye Distance:   Left Eye Distance:   Bilateral Distance:    Right Eye Near:   Left Eye Near:    Bilateral Near:     Physical Exam   UC Treatments / Results   Labs (all labs ordered are listed, but only abnormal results are displayed) Labs Reviewed - No data to display  EKG   Radiology No results found.  Procedures Procedures (including critical care time)  Medications Ordered in UC Medications - No data to display  Initial Impression / Assessment and Plan / UC Course  I have reviewed the triage vital signs and the nursing notes.  Pertinent labs & imaging results that were available during my care of the patient were reviewed by me and considered in my medical decision making (see chart for details).     *** Final Clinical Impressions(s) / UC Diagnoses   Final diagnoses:  None   Discharge Instructions   None    ED Prescriptions   None    PDMP not reviewed this encounter.

## 2023-03-30 NOTE — ED Triage Notes (Signed)
Patient presents to [facility] for evaluation of sore throat worsening since yesterday, headache. Has taken several doses of dayquil today which have helped with the headache, but not at all with the sore throat.  He also c/o leftwrist pain after he struck his wrist on the corner of a metal door.  States the pain was not instant, but now it is radiating up his left arm up to his elbow.

## 2023-03-30 NOTE — Discharge Instructions (Addendum)
Apply a compressive ACE bandage. Rest and elevate the affected painful area.  Apply cold compresses intermittently as needed.   As pain recedes, begin normal activities slowly as tolerated.   Call if symptoms persist.  - We will notify you of your x-ray results via MyChart

## 2023-04-24 NOTE — Progress Notes (Deleted)
    Damon Blair D.[facility] [address] [place] [address] Phone: [phone]   Assessment and Plan:     There are no diagnoses linked to this encounter.  ***   Pertinent previous records reviewed include ***   Follow Up: ***     Subjective:   I, Damon Blair, am serving as a Neurosurgeon for Doctor Richardean Sale  Chief Complaint: ***  HPI:   04/25/2023 Patient is a 32 year old male   Relevant Historical Information: ***  Additional pertinent review of systems negative.   Current Outpatient Medications:    amphetamine-dextroamphetamine (ADDERALL) 5 MG tablet, Take 5 mg by mouth daily., Disp: , Rfl:    baclofen (LIORESAL) 20 MG tablet, Take 20 mg by mouth 3 (three) times daily., Disp: , Rfl:    cetirizine (ZYRTEC) 10 MG tablet, Take 10 mg by mouth as needed., Disp: , Rfl:    diazepam (VALIUM) 5 MG tablet, Take 5 mg by mouth as needed for anxiety., Disp: , Rfl:    dicyclomine (BENTYL) 20 MG tablet, Take 1 tablet (20 mg total) by mouth every 8 (eight) hours as needed for spasms (Abdominal cramping)., Disp: 15 tablet, Rfl: 0   Glatiramer Acetate 40 MG/ML SOSY, Inject into the skin., Disp: , Rfl:    imiquimod (ALDARA) 5 % cream, SMARTSIG:1 Packet(s) Topical 3 Times a Week, Disp: , Rfl:    naproxen (NAPROSYN) 375 MG tablet, Take 1 tablet (375 mg total) by mouth 2 (two) times daily., Disp: 20 tablet, Rfl: 0   ondansetron (ZOFRAN-ODT) 4 MG disintegrating tablet, Take 1 tablet (4 mg total) by mouth every 6 (six) hours as needed for nausea or vomiting., Disp: 20 tablet, Rfl: 0   pregabalin (LYRICA) 200 MG capsule, Take 200 mg by mouth in the morning, at noon, and at bedtime., Disp: , Rfl:    Objective:     There were no vitals filed for this visit.    There is no height or weight on file to calculate BMI.    Physical Exam:    ***   Electronically signed by:  Damon Blair D.[facility] 8:27 AM 04/24/23

## 2023-04-25 ENCOUNTER — Ambulatory Visit: Payer: Managed Care, Other (non HMO) | Admitting: Sports Medicine

## 2023-05-04 ENCOUNTER — Inpatient Hospital Stay
Admission: EM | Admit: 2023-05-04 | Discharge: 2023-05-08 | DRG: 060 | Disposition: A | Discharge status: Home / Self Care | Payer: Managed Care, Other (non HMO) | Attending: Internal Medicine | Admitting: Internal Medicine

## 2023-05-04 ENCOUNTER — Other Ambulatory Visit: Payer: Self-pay

## 2023-05-04 ENCOUNTER — Inpatient Hospital Stay: Payer: Managed Care, Other (non HMO)

## 2023-05-04 DIAGNOSIS — Z888 Allergy status to other drugs, medicaments and biological substances status: Secondary | ICD-10-CM

## 2023-05-04 DIAGNOSIS — G35 Multiple sclerosis: Secondary | ICD-10-CM | POA: Diagnosis present

## 2023-05-04 DIAGNOSIS — Z87891 Personal history of nicotine dependence: Secondary | ICD-10-CM

## 2023-05-04 DIAGNOSIS — Z79899 Other long term (current) drug therapy: Secondary | ICD-10-CM | POA: Diagnosis not present

## 2023-05-04 DIAGNOSIS — Z791 Long term (current) use of non-steroidal anti-inflammatories (NSAID): Secondary | ICD-10-CM

## 2023-05-04 DIAGNOSIS — H5500 Unspecified nystagmus: Secondary | ICD-10-CM | POA: Diagnosis present

## 2023-05-04 DIAGNOSIS — M4802 Spinal stenosis, cervical region: Secondary | ICD-10-CM | POA: Diagnosis present

## 2023-05-04 DIAGNOSIS — Z885 Allergy status to narcotic agent status: Secondary | ICD-10-CM

## 2023-05-04 DIAGNOSIS — F902 Attention-deficit hyperactivity disorder, combined type: Secondary | ICD-10-CM | POA: Diagnosis present

## 2023-05-04 DIAGNOSIS — H538 Other visual disturbances: Secondary | ICD-10-CM | POA: Diagnosis present

## 2023-05-04 DIAGNOSIS — F909 Attention-deficit hyperactivity disorder, unspecified type: Secondary | ICD-10-CM | POA: Diagnosis present

## 2023-05-04 DIAGNOSIS — F410 Panic disorder [episodic paroxysmal anxiety] without agoraphobia: Secondary | ICD-10-CM | POA: Diagnosis present

## 2023-05-04 DIAGNOSIS — M47812 Spondylosis without myelopathy or radiculopathy, cervical region: Secondary | ICD-10-CM | POA: Diagnosis present

## 2023-05-04 DIAGNOSIS — F064 Anxiety disorder due to known physiological condition: Secondary | ICD-10-CM | POA: Diagnosis present

## 2023-05-04 DIAGNOSIS — Z87442 Personal history of urinary calculi: Secondary | ICD-10-CM | POA: Diagnosis not present

## 2023-05-04 DIAGNOSIS — G35D Multiple sclerosis, unspecified: Secondary | ICD-10-CM | POA: Diagnosis present

## 2023-05-04 LAB — BASIC METABOLIC PANEL
Anion gap: 9 (ref 5–15)
BUN: 16 mg/dL (ref 6–20)
CO2: 26 mmol/L (ref 22–32)
Calcium: 9.8 mg/dL (ref 8.9–10.3)
Chloride: 106 mmol/L (ref 98–111)
Creatinine, Ser: 0.79 mg/dL (ref 0.61–1.24)
GFR, Estimated: >60 mL/min (ref >60)
Glucose, Bld: 124 mg/dL — ABNORMAL HIGH (ref 70–99)
Potassium: 4.4 mmol/L (ref 3.5–5.1)
Sodium: 141 mmol/L (ref 135–145)

## 2023-05-04 LAB — CBC
HCT: 38.3 % — ABNORMAL LOW (ref 39.0–52.0)
Hemoglobin: 13.3 g/dL (ref 13.0–17.0)
MCH: 29.4 pg (ref 26.0–34.0)
MCHC: 34.7 g/dL (ref 30.0–36.0)
MCV: 84.7 fL (ref 80.0–100.0)
Platelets: 228 10*3/uL (ref 150–400)
RBC: 4.52 MIL/uL (ref 4.22–5.81)
RDW: 11.9 % (ref 11.5–15.5)
WBC: 4 10*3/uL (ref 4.0–10.5)
nRBC: 0 % (ref 0.0–0.2)

## 2023-05-04 MED ORDER — ONDANSETRON 4 MG PO TBDP
4.0000 mg | ORAL_TABLET | Freq: Four times a day (QID) | ORAL | Status: DC | PRN
  Administered 2023-05-07: 4 mg via ORAL
  Filled 2023-05-04: qty 1

## 2023-05-04 MED ORDER — SODIUM CHLORIDE 0.9 % IV SOLN
1000.0000 mg | INTRAVENOUS | Status: AC
  Administered 2023-05-04: 1000 mg via INTRAVENOUS
  Filled 2023-05-04: qty 16

## 2023-05-04 MED ORDER — PANTOPRAZOLE SODIUM 40 MG IV SOLR
40.0000 mg | Freq: Once | INTRAVENOUS | Status: AC
  Administered 2023-05-04: 40 mg via INTRAVENOUS
  Filled 2023-05-04: qty 10

## 2023-05-04 MED ORDER — BACLOFEN 10 MG PO TABS
40.0000 mg | ORAL_TABLET | Freq: Every day | ORAL | Status: DC
  Administered 2023-05-05 – 2023-05-08 (×4): 40 mg via ORAL
  Filled 2023-05-04 (×4): qty 4

## 2023-05-04 MED ORDER — ACETAMINOPHEN 325 MG PO TABS
325.0000 mg | ORAL_TABLET | Freq: Four times a day (QID) | ORAL | Status: DC | PRN
  Administered 2023-05-05 – 2023-05-07 (×6): 325 mg via ORAL
  Filled 2023-05-04 (×7): qty 1

## 2023-05-04 MED ORDER — AMPHETAMINE-DEXTROAMPHETAMINE 10 MG PO TABS
10.0000 mg | ORAL_TABLET | Freq: Every day | ORAL | Status: DC
  Filled 2023-05-04 (×2): qty 1

## 2023-05-04 MED ORDER — ENOXAPARIN SODIUM 30 MG/0.3ML IJ SOSY
30.0000 mg | PREFILLED_SYRINGE | INTRAMUSCULAR | Status: DC
  Administered 2023-05-04: 30 mg via SUBCUTANEOUS
  Filled 2023-05-04: qty 0.3

## 2023-05-04 MED ORDER — DIAZEPAM 5 MG/ML IJ SOLN
10.0000 mg | Freq: Once | INTRAMUSCULAR | Status: AC
  Administered 2023-05-04: 10 mg via INTRAVENOUS
  Filled 2023-05-04: qty 2

## 2023-05-04 MED ORDER — GADOBUTROL 1 MMOL/ML IV SOLN
8.0000 mL | Freq: Once | INTRAVENOUS | Status: AC | PRN
  Administered 2023-05-04: 8 mL via INTRAVENOUS

## 2023-05-04 MED ORDER — TIZANIDINE HCL 4 MG PO TABS
8.0000 mg | ORAL_TABLET | Freq: Two times a day (BID) | ORAL | Status: DC | PRN
  Administered 2023-05-07: 8 mg via ORAL
  Filled 2023-05-04 (×2): qty 2

## 2023-05-04 MED ORDER — BACLOFEN 10 MG PO TABS
20.0000 mg | ORAL_TABLET | Freq: Every day | ORAL | Status: DC
  Administered 2023-05-04 – 2023-05-07 (×4): 20 mg via ORAL
  Filled 2023-05-04 (×4): qty 2

## 2023-05-04 MED ORDER — ROSUVASTATIN CALCIUM 10 MG PO TABS
5.0000 mg | ORAL_TABLET | Freq: Every day | ORAL | Status: DC
  Administered 2023-05-05 – 2023-05-07 (×2): 5 mg via ORAL
  Filled 2023-05-04 (×4): qty 1

## 2023-05-04 MED ORDER — LORATADINE 10 MG PO TABS
10.0000 mg | ORAL_TABLET | Freq: Every day | ORAL | Status: DC
  Administered 2023-05-05 – 2023-05-08 (×4): 10 mg via ORAL
  Filled 2023-05-04 (×4): qty 1

## 2023-05-04 MED ORDER — GABAPENTIN 400 MG PO CAPS
800.0000 mg | ORAL_CAPSULE | Freq: Two times a day (BID) | ORAL | Status: DC
  Administered 2023-05-04 – 2023-05-07 (×6): 800 mg via ORAL
  Filled 2023-05-04: qty 2
  Filled 2023-05-04: qty 8
  Filled 2023-05-04 (×2): qty 2
  Filled 2023-05-04: qty 8
  Filled 2023-05-04: qty 2
  Filled 2023-05-04: qty 8
  Filled 2023-05-04: qty 2
  Filled 2023-05-04: qty 8
  Filled 2023-05-04: qty 2
  Filled 2023-05-04 (×2): qty 8

## 2023-05-04 MED ORDER — BACLOFEN 10 MG PO TABS: 20.0000 mg | ORAL_TABLET | Freq: Three times a day (TID) | ORAL | Status: DC

## 2023-05-04 MED ORDER — DIAZEPAM 5 MG PO TABS
5.0000 mg | ORAL_TABLET | Freq: Two times a day (BID) | ORAL | Status: DC | PRN
  Administered 2023-05-05 – 2023-05-06 (×4): 5 mg via ORAL
  Filled 2023-05-04 (×4): qty 1

## 2023-05-04 MED ORDER — NAPROXEN 375 MG PO TABS
375.0000 mg | ORAL_TABLET | Freq: Two times a day (BID) | ORAL | Status: DC
  Administered 2023-05-04 – 2023-05-08 (×5): 375 mg via ORAL
  Filled 2023-05-04 (×8): qty 1

## 2023-05-04 MED ORDER — KETOROLAC TROMETHAMINE 15 MG/ML IJ SOLN
15.0000 mg | Freq: Once | INTRAMUSCULAR | Status: AC
  Administered 2023-05-04: 15 mg via INTRAVENOUS
  Filled 2023-05-04: qty 1

## 2023-05-04 MED ORDER — ONDANSETRON HCL 4 MG/2ML IJ SOLN
4.0000 mg | Freq: Four times a day (QID) | INTRAMUSCULAR | Status: DC | PRN
  Administered 2023-05-06 – 2023-05-08 (×3): 4 mg via INTRAVENOUS
  Filled 2023-05-04 (×3): qty 2

## 2023-05-04 MED ORDER — DRONABINOL 2.5 MG PO CAPS
10.0000 mg | ORAL_CAPSULE | Freq: Every day | ORAL | Status: DC
  Administered 2023-05-05 – 2023-05-08 (×4): 10 mg via ORAL
  Filled 2023-05-04 (×2): qty 4
  Filled 2023-05-04: qty 2
  Filled 2023-05-04 (×3): qty 4

## 2023-05-04 MED ORDER — LORAZEPAM 2 MG/ML IJ SOLN: 0.5000 mg | Freq: Once | INTRAMUSCULAR | Status: DC

## 2023-05-04 NOTE — Plan of Care (Signed)
[facility] Tonsillectomy/Adenoidectomy, Postoperative PEDS care plan entered in error.

## 2023-05-04 NOTE — H&P (Signed)
History and Physical    Patient: Damon Blair AOZ:[id number] DOB: March 17, 1991 DOA: 05/04/2023 DOS: the patient was seen and examined on 05/04/2023 PCP: Enid Baas, MD  Patient coming from: Home  Chief Complaint: MS flare  HPI: Damon Blair is a 32 y.o. male with medical history significant of MS diagnosed in 2020, anxiety, ADHD presented to the ED today with symptoms of MS flare that progressed over about two weeks.  He reports pain behind the eyes, vision changes with blurriness, headaches with light sensitivity and mild nausea (no vomiting), numbness in bilateral arms, leg heaviness and paresthesias to the right leg.  Also with facial numbness.  Symptoms are somewhat similar in nature to prior to his diagnosis.  He reports a recent upper respiratory illness, was tested and negative for Covid. Those symptoms have improved.   No other recent illnesses or symptoms on ROS.  ED course - afebrile, HR 95, RR 20, BP 152/95, spO2 98% on RA. Labs including BMP and CBC notable only for glucose 124. Treated with IV Solu-medrol, IV Protonix, IV Toradol, IV Valium.  Pt is admitted for further evaluation and management of acute MS flare with Neurology consulted.    Review of Systems: As mentioned in the history of present illness. All other systems reviewed and are negative.   Past Medical History:  Diagnosis Date   Kidney stones    MS (multiple sclerosis) (HCC)    Past Surgical History:  Procedure Laterality Date   CYSTOSCOPY W/ RETROGRADES  11/18/2021   Procedure: CYSTOSCOPY WITH RETROGRADE PYELOGRAM;  Surgeon: Sondra Come, MD;  Location: [facility];  Service: Urology;;   CYSTOSCOPY/URETEROSCOPY/HOLMIUM LASER/STENT PLACEMENT Left 11/18/2021   Procedure: CYSTOSCOPY/URETEROSCOPY/HOLMIUM LASER/STENT PLACEMENT;  Surgeon: Sondra Come, MD;  Location: [facility] OR[facility];  Service: Urology;  Laterality: Left;   FRACTURE SURGERY Left    Social History:  reports that he has quit smoking.  His smoking use included cigarettes. He has never been exposed to tobacco smoke. He has quit using smokeless tobacco.  His smokeless tobacco use included snuff. He reports that he does not currently use alcohol. He reports that he does not currently use drugs.  Allergies  Allergen Reactions   Clavulanic Acid    Dilaudid [Hydromorphone]    Oxycodone Itching    History reviewed. No pertinent family history.  Prior to Admission medications   Medication Sig Start Date End Date Taking? Authorizing Provider  amphetamine-dextroamphetamine (ADDERALL) 5 MG tablet Take 5 mg by mouth daily. 08/30/20   [provider]  baclofen (LIORESAL) 20 MG tablet Take 20 mg by mouth 3 (three) times daily.    [provider]  cetirizine (ZYRTEC) 10 MG tablet Take 10 mg by mouth as needed.    [provider]  diazepam (VALIUM) 5 MG tablet Take 5 mg by mouth as needed for anxiety.    [provider]  dicyclomine (BENTYL) 20 MG tablet Take 1 tablet (20 mg total) by mouth every 8 (eight) hours as needed for spasms (Abdominal cramping). 01/21/22   Ward, Layla Maw, DO  Glatiramer Acetate 40 MG/ML SOSY Inject into the skin. 07/23/20   [provider]  imiquimod Mathis Dad) 5 % cream SMARTSIG:1 Packet(s) Topical 3 Times a Week 10/24/21   [provider]  naproxen (NAPROSYN) 375 MG tablet Take 1 tablet (375 mg total) by mouth 2 (two) times daily. 03/30/23   Bing Neighbors, NP  ondansetron (ZOFRAN-ODT) 4 MG disintegrating tablet Take 1 tablet (4 mg total) by mouth every 6 (  six) hours as needed for nausea or vomiting. 01/21/22   Ward, Layla Maw, DO  pregabalin (LYRICA) 200 MG capsule Take 200 mg by mouth in the morning, at noon, and at bedtime.    [provider]    Physical Exam: Vitals:   05/04/23 1024 05/04/23 1026 05/04/23 1558  BP: (!) 152/95  (!) 146/76  Pulse: 95  74  Resp: 20  18  Temp: 98.6 F (37 C)  98.5 F (36.9 C)  TempSrc:   Oral  SpO2: 98%  97%   Weight:  77.1 kg   Height:  5\' 9"  (1.753 m)    General exam: awake, alert, no acute distress HEENT: moist mucus membranes, hearing grossly normal  Respiratory system: CTAB, no wheezes, rales or rhonchi, normal respiratory effort. Cardiovascular system: normal S1/S2,  RRR, no JVD, murmurs, rubs, gallops, no pedal edema.   Gastrointestinal system: soft, NT, ND, no HSM felt, +bowel sounds. Central nervous system: A&O x 4. no gross focal neurologic deficits, normal speech Skin: dry, intact, normal temperature Psychiatry: normal mood, congruent affect, judgement and insight appear normal   Data Reviewed:  Notable labs  - as above Imaging - none EKG - none  Assessment and Plan: * MS (multiple sclerosis) (HCC) With Acute Flare Follows at [facility] with Dr. Elwyn Reach, on Glatopa for DMT, acute nystagmus was initial sx. --1g IV Solu-medrol in ED --Neurology consulted - appreciate recommendations --Continue IV steroids --IV Protonix for GI protection --Resumed home Valium, baclofen, naproxen, Zanaflex, gabapentin --Further recs or changes per Neurology  ADHD (attention deficit hyperactivity disorder), combined type Continue home Adderall      Advance Care Planning: Full Code  Consults: Neurology, Dr. Otelia Limes  Family Communication: None present. Pt able to update.  Severity of Illness: The appropriate patient status for this patient is INPATIENT. Inpatient status is judged to be reasonable and necessary in order to provide the required intensity of service to ensure the patient's safety. The patient's presenting symptoms, physical exam findings, and initial radiographic and laboratory data in the context of their chronic comorbidities is felt to place them at high risk for further clinical deterioration. Furthermore, it is not anticipated that the patient will be medically stable for discharge from the hospital within 2 midnights of admission.   * I certify that at the point of admission  it is my clinical judgment that the patient will require inpatient hospital care spanning beyond 2 midnights from the point of admission due to high intensity of service, high risk for further deterioration and high frequency of surveillance required.*  Author: Pennie Banter, DO 05/04/2023 7:42 PM  For on call review [email or web address].

## 2023-05-04 NOTE — Assessment & Plan Note (Signed)
With Acute Flare Follows at [facility] with Dr. Elwyn Reach, on [facility] for DMT, acute nystagmus was initial sx. --1g IV Solu-medrol in ED --Neurology consulted - appreciate recommendations --Continue IV steroids --IV Protonix for GI protection --Resumed home Valium, baclofen, naproxen, Zanaflex, gabapentin --Further recs or changes per Neurology

## 2023-05-04 NOTE — Assessment & Plan Note (Signed)
-  Continue home Adderall

## 2023-05-04 NOTE — ED Notes (Signed)
Informed RN Reaonna via chat/ Pt has bed assigned

## 2023-05-04 NOTE — Assessment & Plan Note (Signed)
>>  ASSESSMENT AND PLAN FOR ATTENTION DEFICIT HYPERACTIVITY DISORDER (ADHD) WRITTEN ON 05/04/2023  7:41 PM BY GRIFFITH, KELLY A, DO  Continue home Adderall

## 2023-05-04 NOTE — ED Triage Notes (Signed)
Pt to ED for "problems with MS". Reports having pain behind eyes, vision problems started Wednesday, numbness to bilateral arms x 2 weeks. Verbal orders Dr Rosalia Hammers

## 2023-05-04 NOTE — ED Provider Notes (Signed)
[facility] Provider Note    Event Date/Time   First MD Initiated Contact with Patient 05/04/23 1517     (approximate)   History   Chief Complaint: No chief complaint on file.   HPI  Damon Blair is a 32 y.o. male with a history of kidney stones, anxiety, multiple sclerosis who comes ED complaining of bilateral retro-orbital pain, blurry vision, along with paresthesia in bilateral arms and heaviness feeling in his legs.  Also complains of severe headache which is not sudden onset.  Symptoms have been worsening for the last 2 days.  Reports that much of the symptoms are a recurrence of prior MS symptoms, but left face and left arm paresthesia are new.     Physical Exam   Triage Vital Signs: ED Triage Vitals  Encounter Vitals Group     BP 05/04/23 1024 (!) 152/95     Systolic BP Percentile --      Diastolic BP Percentile --      Pulse Rate 05/04/23 1024 95     Resp 05/04/23 1024 20     Temp 05/04/23 1024 98.6 F (37 C)     Temp Source 05/04/23 1558 Oral     SpO2 05/04/23 1024 98 %     Weight 05/04/23 1026 170 lb (77.1 kg)     Height 05/04/23 1026 5\' 9"  (1.753 m)     Head Circumference --      Peak Flow --      Pain Score 05/04/23 1026 6     Pain Loc --      Pain Education --      Exclude from Growth Chart --     Most recent vital signs: Vitals:   05/04/23 1024 05/04/23 1558  BP: (!) 152/95 (!) 146/76  Pulse: 95 74  Resp: 20 18  Temp: 98.6 F (37 C) 98.5 F (36.9 C)  SpO2: 98% 97%    General: Awake, no distress.  CV:  Good peripheral perfusion.  Regular rate and rhythm Resp:  Normal effort.  Clear to auscultation bilaterally Abd:  No distention.  Other:  Cranial nerves III through XII intact, no focal weakness, intact coordination   ED Results / Procedures / Treatments   Labs (all labs ordered are listed, but only abnormal results are displayed) Labs Reviewed  CBC - Abnormal; Notable for the following components:       Result Value   HCT 38.3 (*)    All other components within normal limits  BASIC METABOLIC PANEL - Abnormal; Notable for the following components:   Glucose, Bld 124 (*)    All other components within normal limits  HIV ANTIBODY (ROUTINE TESTING W REFLEX)     EKG    RADIOLOGY    PROCEDURES:  Procedures   MEDICATIONS ORDERED IN ED: Medications  enoxaparin (LOVENOX) injection 30 mg (has no administration in time range)  methylPREDNISolone sodium succinate (SOLU-MEDROL) 1,000 mg in sodium chloride 0.9 % 50 mL IVPB (0 mg Intravenous Stopped 05/04/23 1817)  diazepam (VALIUM) injection 10 mg (10 mg Intravenous Given 05/04/23 1706)  ketorolac (TORADOL) 15 MG/ML injection 15 mg (15 mg Intravenous Given 05/04/23 1712)  pantoprazole (PROTONIX) injection 40 mg (40 mg Intravenous Given 05/04/23 1705)     IMPRESSION / MDM / ASSESSMENT AND PLAN / ED COURSE  I reviewed the triage vital signs and the nursing notes.  DDx: Electrolyte abnormality, multiple sclerosis flare, anemia, anxiety  Patient's presentation is most consistent with acute presentation  with potential threat to life or bodily function.  Patient presents with multiple neurologic symptoms, concerning for relapsing multiple sclerosis.  No signs of stroke.  Maintaining airway, comfortable respirations.  High-dose steroids started, case discussed with hospitalist for further management.       FINAL CLINICAL IMPRESSION(S) / ED DIAGNOSES   Final diagnoses:  Multiple sclerosis exacerbation (HCC)     Rx / DC Orders   ED Discharge Orders     None        Note:  This document was prepared using Dragon voice recognition software and may include unintentional dictation errors.   Sharman Cheek, MD 05/04/23 [phone]

## 2023-05-05 DIAGNOSIS — G35 Multiple sclerosis: Secondary | ICD-10-CM | POA: Diagnosis not present

## 2023-05-05 LAB — HIV ANTIBODY (ROUTINE TESTING W REFLEX): HIV Screen 4th Generation wRfx: NONREACTIVE

## 2023-05-05 MED ORDER — SODIUM CHLORIDE 0.9 % IV SOLN
1000.0000 mg | Freq: Every day | INTRAVENOUS | Status: AC
  Administered 2023-05-05 – 2023-05-08 (×4): 1000 mg via INTRAVENOUS
  Filled 2023-05-05 (×4): qty 16

## 2023-05-05 MED ORDER — PROCHLORPERAZINE EDISYLATE 10 MG/2ML IJ SOLN
10.0000 mg | Freq: Four times a day (QID) | INTRAMUSCULAR | Status: AC | PRN
  Administered 2023-05-05: 10 mg via INTRAVENOUS
  Filled 2023-05-05: qty 2

## 2023-05-05 MED ORDER — KETOROLAC TROMETHAMINE 15 MG/ML IJ SOLN
15.0000 mg | Freq: Four times a day (QID) | INTRAMUSCULAR | Status: DC | PRN
  Administered 2023-05-05 – 2023-05-08 (×10): 15 mg via INTRAVENOUS
  Filled 2023-05-05 (×10): qty 1

## 2023-05-05 MED ORDER — DIPHENHYDRAMINE HCL 50 MG/ML IJ SOLN
25.0000 mg | Freq: Once | INTRAMUSCULAR | Status: AC
  Administered 2023-05-05: 25 mg via INTRAVENOUS
  Filled 2023-05-05: qty 1

## 2023-05-05 MED ORDER — DIAZEPAM 5 MG/ML IJ SOLN
2.5000 mg | Freq: Once | INTRAMUSCULAR | Status: AC
  Administered 2023-05-05: 2.5 mg via INTRAVENOUS
  Filled 2023-05-05: qty 2

## 2023-05-05 MED ORDER — ENOXAPARIN SODIUM 40 MG/0.4ML IJ SOSY
40.0000 mg | PREFILLED_SYRINGE | INTRAMUSCULAR | Status: DC
  Administered 2023-05-05 – 2023-05-07 (×3): 40 mg via SUBCUTANEOUS
  Filled 2023-05-05 (×3): qty 0.4

## 2023-05-05 NOTE — Progress Notes (Signed)
Progress Note   Patient: Damon Blair WUJ:[id number] DOB: May 17, 1991 DOA: 05/04/2023     1 DOS: the patient was seen and examined on 05/05/2023   Brief hospital course: HPI on admission 10/18:  Damon Blair is a 32 y.o. male with medical history significant of MS diagnosed in 2020, anxiety, ADHD presented to the ED today with symptoms of MS flare that progressed over about two weeks.  He reports pain behind the eyes, vision changes with blurriness, headaches with light sensitivity and mild nausea (no vomiting), numbness in bilateral arms, leg heaviness and paresthesias to the right leg.  Also with facial numbness.  Symptoms are somewhat similar in nature to prior to his diagnosis.  He reports a recent upper respiratory illness, was tested and negative for Covid. Those symptoms have improved.   No other recent illnesses or symptoms on ROS.   ED course - afebrile, HR 95, RR 20, BP 152/95, spO2 98% on RA. Labs including BMP and CBC notable only for glucose 124. Treated with IV Solu-medrol, IV Protonix, IV Toradol, IV Valium.   Pt is admitted for further evaluation and management of acute MS flare with Neurology consulted.   10/19 -- pt had severe worsening of headache this AM, improved after IV cocktail of Toradol, Compazine, Benadryl.    Assessment and Plan: * MS (multiple sclerosis) (HCC) With Acute Flare Follows at [facility] with Dr. Elwyn Reach, on Copaxone for DMT, acute nystagmus was initial sx. No new lesions seen on MRI brain or MRI C-spine. --1g IV Solu-medrol in ED --Neurology consulted - appreciate recommendations --Continue IV steroids --IV Protonix for GI protection --Resumed home Valium, baclofen, naproxen, Zanaflex, gabapentin --Further recs or changes per Neurology'  Headaches related to MS flare --Tylenol and PO NSAID  If refractory: --IV Compazine and Benadryl --IV Toradol OR Naproxen (hold this is Toradol being used)' --Avoid opioids  ADHD (attention deficit  hyperactivity disorder), combined type Continue home Adderall        Subjective: Pt seen with family at bedside today.  Headache improved after IV cocktail earlier. Pt reports otherwise doing okay so far and agreeable for 5 day IV steroids recommended by neurology.  Physical Exam: Vitals:   05/04/23 2131 05/05/23 0641 05/05/23 0813 05/05/23 1636  BP: 124/73 120/71 137/71 120/71  Pulse: 74 64 76 78  Resp: 16  17   Temp: 98.2 F (36.8 C)  97.9 F (36.6 C) 98.9 F (37.2 C)  TempSrc:      SpO2: 99% 98% 97% 98%  Weight: 78.5 kg     Height: 5\' 9"  (1.753 m)      General exam: awake, alert, no acute distress HEENT:moist mucus membranes, hearing grossly normal  Respiratory system:on room air, normal respiratory effort. Cardiovascular system: RRR no pedal edema.   Gastrointestinal system: soft, NT, ND Central nervous system: A&O x 4. normal speech Extremities: moves all, no edema, normal tone Skin: dry, intact, No rashes seen on visualized skin Psychiatry: normal mood, congruent affect, judgement and insight appear normal   Data Reviewed:  Notable labs --- glucose 124  MRI brain & cervical spine -- no new MS lesions seen. Chronic degenerative changes of C-spine - see reports   Family Communication: At bedside on rounds  Disposition: Status is: Inpatient Remains inpatient appropriate because: on IV steroids for 5 day course   Planned Discharge Destination: Home    Time spent: 35 minutes  Author: Pennie Banter, DO 05/05/2023 7:19 PM  For on call review [email or web address].

## 2023-05-05 NOTE — Consult Note (Addendum)
NEURO HOSPITALIST CONSULT NOTE   Requestig physician: Dr. Denton Lank  Reason for Consult: Multiple sclerosis flare  History obtained from:  Patient and Chart     HPI:                                                                                                                                          Damon Blair is a 32 y.o. male with a PMHx of multiple sclerosis diagnosed 4 years ago, formerly was followed by a physician in [place], now seeing MS specialist Dr. Elwyn Reach at [facility], on Copaxone for disease-modifying therapy, previous flare ups treated with steroids, who presents with symptoms of an MS exacerbation. He has been having vision problems and pain behind his eyes since Wednesday, as well as numbness to his arms for the past 2 weeks. He also complains of a sensation of heaviness involving both of his legs. He has had left face and arm paresthesias, which also are new. He has received one dose of IV Solumedrol since being admitted yesterday.   Past Medical History:  Diagnosis Date   Kidney stones    MS (multiple sclerosis) (HCC)     Past Surgical History:  Procedure Laterality Date   CYSTOSCOPY W/ RETROGRADES  11/18/2021   Procedure: CYSTOSCOPY WITH RETROGRADE PYELOGRAM;  Surgeon: Sondra Come, MD;  Location: [facility] ORS;  Service: Urology;;   CYSTOSCOPY/URETEROSCOPY/HOLMIUM LASER/STENT PLACEMENT Left 11/18/2021   Procedure: CYSTOSCOPY/URETEROSCOPY/HOLMIUM LASER/STENT PLACEMENT;  Surgeon: Sondra Come, MD;  Location: [facility] ORS;  Service: Urology;  Laterality: Left;   FRACTURE SURGERY Left     History reviewed. No pertinent family history.           Social History:  reports that he has quit smoking. His smoking use included cigarettes. He has never been exposed to tobacco smoke. He has quit using smokeless tobacco.  His smokeless tobacco use included snuff. He reports that he does not currently use alcohol. He reports that he does not currently use  drugs.  Allergies  Allergen Reactions   Clavulanic Acid    Dilaudid [Hydromorphone]    Oxycodone Itching    MEDICATIONS:  Prior to Admission:  Medications Prior to Admission  Medication Sig Dispense Refill Last Dose   acetaminophen (TYLENOL) 325 MG tablet Take 325 mg by mouth every 6 (six) hours as needed.   unk   amphetamine-dextroamphetamine (ADDERALL) 5 MG tablet Take 10 mg by mouth daily.   05/04/2023   baclofen (LIORESAL) 20 MG tablet Take 20 mg by mouth 3 (three) times daily. 40 am 20 pm   05/04/2023   cetirizine (ZYRTEC) 10 MG tablet Take 10 mg by mouth as needed.   05/04/2023   cyanocobalamin (VITAMIN B12) 1000 MCG/ML injection Inject 1,000 mcg into the muscle every 30 (thirty) days.   Past Month   diazepam (VALIUM) 5 MG tablet Take 5 mg by mouth as needed for anxiety.   05/04/2023   dronabinol (MARINOL) 5 MG capsule Take 10 mg by mouth daily.   05/04/2023   gabapentin (NEURONTIN) 800 MG tablet Take 800 mg by mouth 2 (two) times daily.   05/04/2023   Glatiramer Acetate 40 MG/ML SOSY Inject into the skin. Weds,fri,sun   05/04/2023   naproxen (NAPROSYN) 375 MG tablet Take 1 tablet (375 mg total) by mouth 2 (two) times daily. 20 tablet 0 05/03/2023   ondansetron (ZOFRAN-ODT) 4 MG disintegrating tablet Take 1 tablet (4 mg total) by mouth every 6 (six) hours as needed for nausea or vomiting. 20 tablet 0 unk   rosuvastatin (CRESTOR) 5 MG tablet Take 5 mg by mouth daily.   05/04/2023   tiZANidine (ZANAFLEX) 4 MG tablet Take 8 mg by mouth 2 (two) times daily as needed.   Past Month   dicyclomine (BENTYL) 20 MG tablet Take 1 tablet (20 mg total) by mouth every 8 (eight) hours as needed for spasms (Abdominal cramping). (Patient not taking: Reported on 05/04/2023) 15 tablet 0 Not Taking   HYDROmorphone (DILAUDID) 2 MG tablet Take 2 mg by mouth 2 (two) times daily as needed.  (Patient not taking: Reported on 05/04/2023)   Not Taking   imiquimod (ALDARA) 5 % cream SMARTSIG:1 Packet(s) Topical 3 Times a Week (Patient not taking: Reported on 05/04/2023)   Not Taking   pregabalin (LYRICA) 200 MG capsule Take 200 mg by mouth in the morning, at noon, and at bedtime. (Patient not taking: Reported on 05/04/2023)   Not Taking   Scheduled:  amphetamine-dextroamphetamine  10 mg Oral Daily   baclofen  40 mg Oral Daily   And   baclofen  20 mg Oral QHS   dronabinol  10 mg Oral Daily   enoxaparin (LOVENOX) injection  40 mg Subcutaneous Q24H   gabapentin  800 mg Oral BID   loratadine  10 mg Oral Daily   LORazepam  0.5 mg Intravenous Once   naproxen  375 mg Oral BID   rosuvastatin  5 mg Oral Daily   Continuous:  methylPREDNISolone (SOLU-MEDROL) injection       ROS:  Currently has a bitemporal and bilateral retroorbital headache. Other ROS as per HPI.    Blood pressure 137/71, pulse 76, temperature 97.9 F (36.6 C), resp. rate 17, height 5\' 9"  (1.753 m), weight 78.5 kg, SpO2 97%.   General Examination:                                                                                                       Physical Exam HEENT- Damon Blair   Lungs- Respirations unlabored Extremities- Warm and well-perfused  Neurological Examination Mental Status: Awake and alert. Fully oriented. Thought content appropriate.  Speech fluent without evidence of aphasia.  Able to follow all commands without difficulty. Cranial Nerves: II: Temporal visual fields intact with no extinction to DSS. Right pupil 3 mm and reactive, left pupil 4 mm and reactive. No RAPD.  III,IV, VI: No ptosis. EOMI. No nystagmus. V: Temp sensation decreased on the left.  VII: Smile symmetric VIII: Hearing intact to voice IX,X: No hypophonia or hoarseness XI: Symmetric XII: Midline  tongue extension Motor: RUE: 4+/5 proximally to deltoid, biceps and triceps, with 5/5 grip strength (subtle asymmetry relative to the left) LUE: 5/5 proximally and distally RLE: 5/5 LLE: 5/5 Sensory: Bilateral legs with decreased FT and temp sensation. RUE with decreased temp and FT sensation. Temp and FT intact to LUE and LLE. No extinction to DSS. Deep Tendon Reflexes: 1+ right biceps and brachioradialis. 2+ left biceps and brachioradialis. Trace bilateral patellae. 1+ bilateral achilles.  Cerebellar: No ataxia with FNF and H-S bilaterally Gait: Deferred   Lab Results: Basic Metabolic Panel: Recent Labs  Lab 05/04/23 1031  NA 141  K 4.4  CL 106  CO2 26  GLUCOSE 124*  BUN 16  CREATININE 0.79  CALCIUM 9.8    CBC: Recent Labs  Lab 05/04/23 1031  WBC 4.0  HGB 13.3  HCT 38.3*  MCV 84.7  PLT 228    Cardiac Enzymes: No results for input(s): "CKTOTAL", "CKMB", "CKMBINDEX", "TROPONINI" in the last 168 hours.  Lipid Panel: No results for input(s): "CHOL", "TRIG", "HDL", "CHOLHDL", "VLDL", "LDLCALC" in the last 168 hours.  Imaging: MR BRAIN W WO CONTRAST  Result Date: 05/05/2023 CLINICAL DATA:  12/02/2022 MRI head and cervical spine EXAM: MRI HEAD WITHOUT AND WITH CONTRAST MRI CERVICAL SPINE WITHOUT AND WITH CONTRAST TECHNIQUE: Multiplanar, multiecho pulse sequences of the brain and surrounding structures, and cervical spine, to include the craniocervical junction and cervicothoracic junction, were obtained without and with intravenous contrast. CONTRAST:  8mL GADAVIST GADOBUTROL 1 MMOL/ML IV SOLN COMPARISON:  None Available. FINDINGS: MRI HEAD FINDINGS Brain: No restricted diffusion to suggest acute or subacute infarct. No abnormal parenchymal or meningeal enhancement. Redemonstrated T2 hyperintense foci in the periventricular, deep, and subcortical white matter, as well as in the pons and ventral left medulla unchanged from the prior exam. A lesion in the left globus pallidus  may represent a demyelinating lesion, dilated perivascular space, or remote infarct; this appears unchanged from prior exam. No acute hemorrhage, mass, mass effect, or midline shift. No hydrocephalus or extra-axial collection. Normal pituitary and craniocervical junction. No  hemosiderin deposition to suggest remote hemorrhage. Normal cerebral volume for age. Vascular: Normal arterial flow voids. Normal arterial and venous enhancement. Skull and upper cervical spine: Normal marrow signal. Sinuses/Orbits: Mucosal thickening in the ethmoid air cells and sphenoid sinuses. No acute finding in the orbits. Other: The mastoid air cells are well aerated. MRI CERVICAL SPINE FINDINGS Alignment: Straightening of the normal cervical lordosis. No listhesis. Vertebrae: No acute fracture, evidence of discitis, or suspicious osseous lesion. Cord: Redemonstrated subtle, hazy T2 hyperintense signal in the central cord at the level of C4 (series 8, image 20) and in the left hemicord at C5 (series 8, images 20 5-26). No abnormal enhancement. No new T2 hyperintense signal. Otherwise normal in signal and morphology. Posterior Fossa, vertebral arteries, paraspinal tissues: Negative. Disc levels: C2-C3: No significant disc bulge. No spinal canal stenosis or neuroforaminal narrowing. C3-C4: Minimal disc bulge. Uncovertebral hypertrophy. Small central disc protrusion. No spinal canal stenosis. Mild bilateral neural foraminal narrowing. C4-C5: Minimal disc bulge. Uncovertebral and right facet hypertrophy. Small central disc protrusion with annular fissure. No spinal canal stenosis or neural foraminal narrowing. C5-C6: Mild disc bulge with right paracentral disc protrusion. Uncovertebral hypertrophy. Mild spinal canal stenosis and mild right neural foraminal narrowing, unchanged. C6-C7: No significant disc bulge. No spinal canal stenosis or neuroforaminal narrowing. C7-T1: No significant disc bulge. No spinal canal stenosis or neuroforaminal  narrowing. IMPRESSION: 1. Unchanged T2 hyperintense lesions in the supratentorial and infratentorial brain, consistent with the patient's history of multiple sclerosis. No new lesion or abnormal enhancement to suggest active demyelination. 2. Unchanged subtle T2 hyperintense foci in spinal cord, consistent with the patient's history of multiple sclerosis. No new lesion or abnormal enhancement to suggest active demyelination. 3. Unchanged mild degenerative changes in the cervical spine, with mild spinal canal stenosis and mild right neural foraminal narrowing at C5-C6, and mild bilateral neural foraminal narrowing at C3-C4. Electronically Signed   By: Wiliam Ke M.D.   On: 05/05/2023 00:21   MR CERVICAL SPINE W WO CONTRAST  Result Date: 05/05/2023 CLINICAL DATA:  12/02/2022 MRI head and cervical spine EXAM: MRI HEAD WITHOUT AND WITH CONTRAST MRI CERVICAL SPINE WITHOUT AND WITH CONTRAST TECHNIQUE: Multiplanar, multiecho pulse sequences of the brain and surrounding structures, and cervical spine, to include the craniocervical junction and cervicothoracic junction, were obtained without and with intravenous contrast. CONTRAST:  8mL GADAVIST GADOBUTROL 1 MMOL/ML IV SOLN COMPARISON:  None Available. FINDINGS: MRI HEAD FINDINGS Brain: No restricted diffusion to suggest acute or subacute infarct. No abnormal parenchymal or meningeal enhancement. Redemonstrated T2 hyperintense foci in the periventricular, deep, and subcortical white matter, as well as in the pons and ventral left medulla unchanged from the prior exam. A lesion in the left globus pallidus may represent a demyelinating lesion, dilated perivascular space, or remote infarct; this appears unchanged from prior exam. No acute hemorrhage, mass, mass effect, or midline shift. No hydrocephalus or extra-axial collection. Normal pituitary and craniocervical junction. No hemosiderin deposition to suggest remote hemorrhage. Normal cerebral volume for age.  Vascular: Normal arterial flow voids. Normal arterial and venous enhancement. Skull and upper cervical spine: Normal marrow signal. Sinuses/Orbits: Mucosal thickening in the ethmoid air cells and sphenoid sinuses. No acute finding in the orbits. Other: The mastoid air cells are well aerated. MRI CERVICAL SPINE FINDINGS Alignment: Straightening of the normal cervical lordosis. No listhesis. Vertebrae: No acute fracture, evidence of discitis, or suspicious osseous lesion. Cord: Redemonstrated subtle, hazy T2 hyperintense signal in the central cord at the level of C4 (series  8, image 20) and in the left hemicord at C5 (series 8, images 20 5-26). No abnormal enhancement. No new T2 hyperintense signal. Otherwise normal in signal and morphology. Posterior Fossa, vertebral arteries, paraspinal tissues: Negative. Disc levels: C2-C3: No significant disc bulge. No spinal canal stenosis or neuroforaminal narrowing. C3-C4: Minimal disc bulge. Uncovertebral hypertrophy. Small central disc protrusion. No spinal canal stenosis. Mild bilateral neural foraminal narrowing. C4-C5: Minimal disc bulge. Uncovertebral and right facet hypertrophy. Small central disc protrusion with annular fissure. No spinal canal stenosis or neural foraminal narrowing. C5-C6: Mild disc bulge with right paracentral disc protrusion. Uncovertebral hypertrophy. Mild spinal canal stenosis and mild right neural foraminal narrowing, unchanged. C6-C7: No significant disc bulge. No spinal canal stenosis or neuroforaminal narrowing. C7-T1: No significant disc bulge. No spinal canal stenosis or neuroforaminal narrowing. IMPRESSION: 1. Unchanged T2 hyperintense lesions in the supratentorial and infratentorial brain, consistent with the patient's history of multiple sclerosis. No new lesion or abnormal enhancement to suggest active demyelination. 2. Unchanged subtle T2 hyperintense foci in spinal cord, consistent with the patient's history of multiple sclerosis. No  new lesion or abnormal enhancement to suggest active demyelination. 3. Unchanged mild degenerative changes in the cervical spine, with mild spinal canal stenosis and mild right neural foraminal narrowing at C5-C6, and mild bilateral neural foraminal narrowing at C3-C4. Electronically Signed   By: Wiliam Ke M.D.   On: 05/05/2023 00:21     Assessment: 32 y.o. male with a PMHx of multiple sclerosis diagnosed 4 years ago, formerly was followed by a physician in [place], now seeing MS specialist Dr. Elwyn Reach at [facility], on Copaxone for disease-modifying therapy, previous flare ups treated with steroids, who presents with symptoms of an MS exacerbation. He has been having vision problems and pain behind his eyes since Wednesday, as well as numbness to his arms for the past 2 weeks. He also complains of a sensation of heaviness involving both of his legs. He has had left face and arm paresthesias, which also are new. He has received one dose of IV Solumedrol since being admitted yesterday.  - Exam reveals relatively mild sensory and motor deficits, with some asymmetry of reflexes as well.  - MRI brain w/wo contrast: Unchanged T2 hyperintense lesions in the supratentorial and infratentorial brain, consistent with the patient's history of multiple sclerosis. No new lesion or abnormal enhancement to suggest active demyelination.  - MRI cervical spine w/wo contrast: Unchanged subtle T2 hyperintense foci in spinal cord, consistent with the patient's history of multiple sclerosis. No new lesion or abnormal enhancement to suggest active demyelination. Unchanged mild degenerative changes in the cervical spine, with mild spinal canal stenosis and mild right neural foraminal narrowing at C5-C6, and mild bilateral neural foraminal narrowing at C3-C4.  - Overall impression: Multiple sclerosis exacerbation   Recommendations: - Conservative management of headache with Tylenol and ibuprofen. Benadryl and Compazine can also be  used. Encourage massage to the temporalis region +/- warm or cool towels. Avoid opiates.  - Continue IV Solumedrol 1000 mg every day for a total of 5 days.  - Monitor CBG, BMP and CBC while on steroids - Continue Copazone at discharge - PT/OT - Outpatient follow up with Dr. Elwyn Reach of [facility].    Electronically signed: Dr. Caryl Pina 05/05/2023, 12:13 PM

## 2023-05-05 NOTE — Hospital Course (Signed)
HPI on admission 10/18:  Damon Blair is a 32 y.o. male with medical history significant of MS diagnosed in 2020, anxiety, ADHD presented to the ED today with symptoms of MS flare that progressed over about two weeks.  He reports pain behind the eyes, vision changes with blurriness, headaches with light sensitivity and mild nausea (no vomiting), numbness in bilateral arms, leg heaviness and paresthesias to the right leg.  Also with facial numbness.  Symptoms are somewhat similar in nature to prior to his diagnosis.  He reports a recent upper respiratory illness, was tested and negative for Covid. Those symptoms have improved.   No other recent illnesses or symptoms on ROS.   ED course - afebrile, HR 95, RR 20, BP 152/95, spO2 98% on RA. Labs including BMP and CBC notable only for glucose 124. Treated with IV Solu-medrol, IV Protonix, IV Toradol, IV Valium.   Pt is admitted for further evaluation and management of acute MS flare with Neurology consulted.   10/19 -- pt had severe worsening of headache this AM, improved after IV cocktail of Toradol, Compazine, Benadryl.

## 2023-05-05 NOTE — Plan of Care (Signed)

## 2023-05-06 DIAGNOSIS — G35 Multiple sclerosis: Secondary | ICD-10-CM | POA: Diagnosis not present

## 2023-05-06 MED ORDER — DIAZEPAM 5 MG/ML IJ SOLN
5.0000 mg | Freq: Every evening | INTRAMUSCULAR | Status: DC | PRN
  Administered 2023-05-06: 5 mg via INTRAVENOUS
  Filled 2023-05-06: qty 2

## 2023-05-06 MED ORDER — DIPHENHYDRAMINE HCL 50 MG/ML IJ SOLN
25.0000 mg | Freq: Once | INTRAMUSCULAR | Status: AC
  Administered 2023-05-06: 25 mg via INTRAVENOUS
  Filled 2023-05-06: qty 1

## 2023-05-06 MED ORDER — DIAZEPAM 2 MG PO TABS: 2.0000 mg | ORAL_TABLET | Freq: Every day | ORAL | Status: DC | PRN

## 2023-05-06 MED ORDER — PROCHLORPERAZINE EDISYLATE 10 MG/2ML IJ SOLN
10.0000 mg | Freq: Once | INTRAMUSCULAR | Status: AC
  Administered 2023-05-06: 10 mg via INTRAVENOUS
  Filled 2023-05-06: qty 2

## 2023-05-06 MED ORDER — DIAZEPAM 5 MG PO TABS
5.0000 mg | ORAL_TABLET | Freq: Every evening | ORAL | Status: DC | PRN
  Administered 2023-05-06: 5 mg via ORAL
  Filled 2023-05-06: qty 1

## 2023-05-06 NOTE — Progress Notes (Signed)
Progress Note   Patient: Damon Blair [id number]:[id number] DOB: [birth year] DOA: 05/04/2023     2 DOS: the patient was seen and examined on 05/06/2023   Brief hospital course: HPI on admission 10/18:  Damon Blair is a 32 y.o. male with medical history significant of MS diagnosed in 2020, anxiety, ADHD presented to the ED today with symptoms of MS flare that progressed over about two weeks.  He reports pain behind the eyes, vision changes with blurriness, headaches with light sensitivity and mild nausea (no vomiting), numbness in bilateral arms, leg heaviness and paresthesias to the right leg.  Also with facial numbness.  Symptoms are somewhat similar in nature to prior to his diagnosis.  He reports a recent upper respiratory illness, was tested and negative for Covid. Those symptoms have improved.   No other recent illnesses or symptoms on ROS.   ED course - afebrile, HR 95, RR 20, BP 152/95, spO2 98% on RA. Labs including BMP and CBC notable only for glucose 124. Treated with IV Solu-medrol, IV Protonix, IV Toradol, IV Valium.   Pt is admitted for further evaluation and management of acute MS flare with Neurology consulted.   10/19 -- pt had severe worsening of headache this AM, improved after IV cocktail of Toradol, Compazine, Benadryl.    Assessment and Plan: * MS (multiple sclerosis) (HCC) With Acute Flare Follows at [facility] with Dr. Elwyn Blair, on Copaxone for DMT, acute nystagmus was initial sx. No new lesions seen on MRI brain or MRI C-spine. --1g IV Solu-medrol in ED --Neurology consulted - appreciate recommendations --Continue IV steroids --IV Protonix for GI protection --Resumed home Valium, baclofen, naproxen, Zanaflex, gabapentin --Further recs or changes per Neurology'  Headaches related to MS flare --Tylenol and PO NSAID  If refractory: --IV Compazine and Benadryl --IV Toradol OR Naproxen (hold this is Toradol being used)' --Avoid opioids  ADHD (attention deficit  hyperactivity disorder), combined type Continue home Adderall        Subjective: Pt seen with family at bedside today.  He was resting due to persistent headache, wearing eye mask. Did not get relief from headache with meds given this AM.  No other complaints.   Physical Exam: Vitals:   05/05/23 2026 05/06/23 0637 05/06/23 0829 05/06/23 1556  BP: 137/86 (!) 109/58 105/66 121/67  Pulse: (!) 106 (!) 54 62 76  Resp:   16 17  Temp:  98 F (36.7 C) 97.8 F (36.6 C) 98.2 F (36.8 C)  TempSrc:      SpO2: 95%  100% 98%  Weight:      Height:       General exam: sleeping, wakes to voice, no acute distress HEENT: wearing eye mask, hearing grossly normal  Respiratory system:on room air, normal respiratory effort. Cardiovascular system: RRR no pedal edema.   Central nervous system: A&O x 4. normal speech, grossly non-focal Extremities: no edema, normal tone Psychiatry: normal mood, congruent affect, judgement and insight appear normal   Data Reviewed:  No new labs today  MRI brain & cervical spine -- no new MS lesions seen. Chronic degenerative changes of C-spine - see reports   Family Communication: At bedside on rounds  Disposition: Status is: Inpatient Remains inpatient appropriate because: on IV steroids for 5 day course   Planned Discharge Destination: Home    Time spent: 35 minutes  Author: Pennie Banter, DO 05/06/2023 5:11 PM  For on call review [email or web address].

## 2023-05-06 NOTE — Progress Notes (Signed)
Subjective: Continues to have photophobia and headache.   Objective: Current vital signs: BP 113/60 (BP Location: Left Arm)   Pulse (!) 59   Temp 98 F (36.7 C)   Resp 19   Ht 5\' 9"  (1.753 m)   Wt 78.5 kg   SpO2 100%   BMI 25.56 kg/m  Vital signs in last 24 hours: Temp:  [97.8 F (36.6 C)-98.2 F (36.8 C)] 98 F (36.7 C) (10/20 1924) Pulse Rate:  [54-76] 59 (10/20 1924) Resp:  [16-19] 19 (10/20 1924) BP: (105-121)/(58-67) 113/60 (10/20 1924) SpO2:  [98 %-100 %] 100 % (10/20 1924)  Intake/Output from previous day: 10/19 0701 - 10/20 0700 In: 360 [P.O.:360] Out: -  Intake/Output this shift: No intake/output data recorded. Nutritional status:  Diet Order             Diet regular Fluid consistency: Thin  Diet effective now                   Physical Exam HEENT- [place]/AT   Lungs- Respirations unlabored Extremities- No edema   Neurological Examination Mental Status: Awake, alert and oriented. Anxious and pained affect. Thought content appropriate.  Speech fluent without evidence of aphasia.  Able to follow all commands without difficulty. Cranial Nerves: II: Deferred due to photophobia.  III,IV, VI: Deferred due to photophobia.  VII: Smile symmetric VIII: Hearing intact to voice IX,X: No hypophonia or hoarseness XI: Symmetric XII: No lingual dysarthria Motor: 5/5 x 4 without asymmetry.  Sensory: Decreased FT sensation to RUE and RLE. Cerebellar: No ataxia with FNF bilaterally Gait: Deferred   Lab Results: Results for orders placed or performed during the hospital encounter of 05/04/23 (from the past 48 hour(s))  HIV Antibody (routine testing w rflx)     Status: None   Collection Time: 05/05/23  5:15 AM  Result Value Ref Range   HIV Screen 4th Generation wRfx Non Reactive Non Reactive    Comment: Performed at [facility], [address] [place], [place] [address]    No results found for this or any previous visit (from the past 240  hour(s)).  Lipid Panel No results for input(s): "CHOL", "TRIG", "HDL", "CHOLHDL", "VLDL", "LDLCALC" in the last 72 hours.  Studies/Results: MR BRAIN W WO CONTRAST  Result Date: 05/05/2023 CLINICAL DATA:  12/02/2022 MRI head and cervical spine EXAM: MRI HEAD WITHOUT AND WITH CONTRAST MRI CERVICAL SPINE WITHOUT AND WITH CONTRAST TECHNIQUE: Multiplanar, multiecho pulse sequences of the brain and surrounding structures, and cervical spine, to include the craniocervical junction and cervicothoracic junction, were obtained without and with intravenous contrast. CONTRAST:  8mL GADAVIST GADOBUTROL 1 MMOL/ML IV SOLN COMPARISON:  None Available. FINDINGS: MRI HEAD FINDINGS Brain: No restricted diffusion to suggest acute or subacute infarct. No abnormal parenchymal or meningeal enhancement. Redemonstrated T2 hyperintense foci in the periventricular, deep, and subcortical white matter, as well as in the pons and ventral left medulla unchanged from the prior exam. A lesion in the left globus pallidus may represent a demyelinating lesion, dilated perivascular space, or remote infarct; this appears unchanged from prior exam. No acute hemorrhage, mass, mass effect, or midline shift. No hydrocephalus or extra-axial collection. Normal pituitary and craniocervical junction. No hemosiderin deposition to suggest remote hemorrhage. Normal cerebral volume for age. Vascular: Normal arterial flow voids. Normal arterial and venous enhancement. Skull and upper cervical spine: Normal marrow signal. Sinuses/Orbits: Mucosal thickening in the ethmoid air cells and sphenoid sinuses. No acute finding in the orbits. Other: The mastoid  air cells are well aerated. MRI CERVICAL SPINE FINDINGS Alignment: Straightening of the normal cervical lordosis. No listhesis. Vertebrae: No acute fracture, evidence of discitis, or suspicious osseous lesion. Cord: Redemonstrated subtle, hazy T2 hyperintense signal in the central cord at the level of C4  (series 8, image 20) and in the left hemicord at C5 (series 8, images 20 5-26). No abnormal enhancement. No new T2 hyperintense signal. Otherwise normal in signal and morphology. Posterior Fossa, vertebral arteries, paraspinal tissues: Negative. Disc levels: C2-C3: No significant disc bulge. No spinal canal stenosis or neuroforaminal narrowing. C3-C4: Minimal disc bulge. Uncovertebral hypertrophy. Small central disc protrusion. No spinal canal stenosis. Mild bilateral neural foraminal narrowing. C4-C5: Minimal disc bulge. Uncovertebral and right facet hypertrophy. Small central disc protrusion with annular fissure. No spinal canal stenosis or neural foraminal narrowing. C5-C6: Mild disc bulge with right paracentral disc protrusion. Uncovertebral hypertrophy. Mild spinal canal stenosis and mild right neural foraminal narrowing, unchanged. C6-C7: No significant disc bulge. No spinal canal stenosis or neuroforaminal narrowing. C7-T1: No significant disc bulge. No spinal canal stenosis or neuroforaminal narrowing. IMPRESSION: 1. Unchanged T2 hyperintense lesions in the supratentorial and infratentorial brain, consistent with the patient's history of multiple sclerosis. No new lesion or abnormal enhancement to suggest active demyelination. 2. Unchanged subtle T2 hyperintense foci in spinal cord, consistent with the patient's history of multiple sclerosis. No new lesion or abnormal enhancement to suggest active demyelination. 3. Unchanged mild degenerative changes in the cervical spine, with mild spinal canal stenosis and mild right neural foraminal narrowing at C5-C6, and mild bilateral neural foraminal narrowing at C3-C4. Electronically Signed   By: Wiliam Ke M.D.   On: 05/05/2023 00:21   MR CERVICAL SPINE W WO CONTRAST  Result Date: 05/05/2023 CLINICAL DATA:  12/02/2022 MRI head and cervical spine EXAM: MRI HEAD WITHOUT AND WITH CONTRAST MRI CERVICAL SPINE WITHOUT AND WITH CONTRAST TECHNIQUE: Multiplanar,  multiecho pulse sequences of the brain and surrounding structures, and cervical spine, to include the craniocervical junction and cervicothoracic junction, were obtained without and with intravenous contrast. CONTRAST:  8mL GADAVIST GADOBUTROL 1 MMOL/ML IV SOLN COMPARISON:  None Available. FINDINGS: MRI HEAD FINDINGS Brain: No restricted diffusion to suggest acute or subacute infarct. No abnormal parenchymal or meningeal enhancement. Redemonstrated T2 hyperintense foci in the periventricular, deep, and subcortical white matter, as well as in the pons and ventral left medulla unchanged from the prior exam. A lesion in the left globus pallidus may represent a demyelinating lesion, dilated perivascular space, or remote infarct; this appears unchanged from prior exam. No acute hemorrhage, mass, mass effect, or midline shift. No hydrocephalus or extra-axial collection. Normal pituitary and craniocervical junction. No hemosiderin deposition to suggest remote hemorrhage. Normal cerebral volume for age. Vascular: Normal arterial flow voids. Normal arterial and venous enhancement. Skull and upper cervical spine: Normal marrow signal. Sinuses/Orbits: Mucosal thickening in the ethmoid air cells and sphenoid sinuses. No acute finding in the orbits. Other: The mastoid air cells are well aerated. MRI CERVICAL SPINE FINDINGS Alignment: Straightening of the normal cervical lordosis. No listhesis. Vertebrae: No acute fracture, evidence of discitis, or suspicious osseous lesion. Cord: Redemonstrated subtle, hazy T2 hyperintense signal in the central cord at the level of C4 (series 8, image 20) and in the left hemicord at C5 (series 8, images 20 5-26). No abnormal enhancement. No new T2 hyperintense signal. Otherwise normal in signal and morphology. Posterior Fossa, vertebral arteries, paraspinal tissues: Negative. Disc levels: C2-C3: No significant disc bulge. No spinal canal stenosis or neuroforaminal  narrowing. C3-C4: Minimal disc  bulge. Uncovertebral hypertrophy. Small central disc protrusion. No spinal canal stenosis. Mild bilateral neural foraminal narrowing. C4-C5: Minimal disc bulge. Uncovertebral and right facet hypertrophy. Small central disc protrusion with annular fissure. No spinal canal stenosis or neural foraminal narrowing. C5-C6: Mild disc bulge with right paracentral disc protrusion. Uncovertebral hypertrophy. Mild spinal canal stenosis and mild right neural foraminal narrowing, unchanged. C6-C7: No significant disc bulge. No spinal canal stenosis or neuroforaminal narrowing. C7-T1: No significant disc bulge. No spinal canal stenosis or neuroforaminal narrowing. IMPRESSION: 1. Unchanged T2 hyperintense lesions in the supratentorial and infratentorial brain, consistent with the patient's history of multiple sclerosis. No new lesion or abnormal enhancement to suggest active demyelination. 2. Unchanged subtle T2 hyperintense foci in spinal cord, consistent with the patient's history of multiple sclerosis. No new lesion or abnormal enhancement to suggest active demyelination. 3. Unchanged mild degenerative changes in the cervical spine, with mild spinal canal stenosis and mild right neural foraminal narrowing at C5-C6, and mild bilateral neural foraminal narrowing at C3-C4. Electronically Signed   By: Wiliam Ke M.D.   On: 05/05/2023 00:21    Medications: Scheduled:  amphetamine-dextroamphetamine  10 mg Oral Daily   baclofen  40 mg Oral Daily   And   baclofen  20 mg Oral QHS   dronabinol  10 mg Oral Daily   enoxaparin (LOVENOX) injection  40 mg Subcutaneous Q24H   gabapentin  800 mg Oral BID   loratadine  10 mg Oral Daily   naproxen  375 mg Oral BID   rosuvastatin  5 mg Oral Daily   Continuous:  methylPREDNISolone (SOLU-MEDROL) injection 1,000 mg (05/06/23 1006)    Assessment: 32 y.o. male with a PMHx of multiple sclerosis diagnosed 4 years ago, formerly was followed by a physician in [place], now seeing MS  specialist Dr. Elwyn Reach at [facility], on Copaxone for disease-modifying therapy, previous flare ups treated with steroids, who presents with symptoms of an MS exacerbation. He had been having vision problems and pain behind his eyes since Wednesday, as well as numbness to his arms for the past 2 weeks. He also complained of a sensation of heaviness involving both of his legs. He has had left face and arm paresthesias, which also are new. He is being treated with a 5 day course of IV Solumedrol.  - Exam today reveals improved RUE strength. Right sided sensory deficit continues. Continues to have a headache which has been refractory to headache cocktail.  - MRI brain w/wo contrast: Unchanged T2 hyperintense lesions in the supratentorial and infratentorial brain, consistent with the patient's history of multiple sclerosis. No new lesion or abnormal enhancement to suggest active demyelination.  - MRI cervical spine w/wo contrast: Unchanged subtle T2 hyperintense foci in spinal cord, consistent with the patient's history of multiple sclerosis. No new lesion or abnormal enhancement to suggest active demyelination. Unchanged mild degenerative changes in the cervical spine, with mild spinal canal stenosis and mild right neural foraminal narrowing at C5-C6, and mild bilateral neural foraminal narrowing at C3-C4.  - Overall impression: Multiple sclerosis exacerbation     Recommendations: - Conservative management of headache with headache cocktails. Benadryl and Compazine can also be used. Encourage massage to the temporalis region +/- warm or cool towels. Avoid opiates.  - Continue IV Solumedrol 1000 mg every day for a total of 5 days. Last dose will be on Tuesday - Eye dressing OS has been ordered for daytime hours as his photophobia is worse in his left eye.  No conventional eye-patch (strapped black single-eye patch) can be found on formulary.  - Valium 5 mg IV at bedtime PRN sleep.  - Monitor CBG, BMP and CBC while on  steroids - Continue Copaxone at discharge - PT/OT - Outpatient follow up with Dr. Elwyn Reach of [facility].   LOS: 2 days   @Electronically  signed: Dr. Caryl Pina 05/06/2023  8:55 PM

## 2023-05-06 NOTE — TOC CM/SW Note (Signed)
Transition of Care [facility]) - Inpatient Brief Assessment   Patient Details  Name: Damon Blair MRN: [id number] Date of Birth: 05-17-1991  Transition of [facility] CM/SW Contact:    Liliana Cline, LCSW Phone Number: 05/06/2023, 8:55 AM  Transition of Care Asessment: Insurance and Status: Insurance coverage has been reviewed Patient has primary care physician: Yes     Prior/Current Home Services: No current home services Social Determinants of Health Reivew: SDOH reviewed no interventions necessary Readmission risk has been reviewed: Yes Transition of care needs: no transition of care needs at this time

## 2023-05-07 ENCOUNTER — Inpatient Hospital Stay: Payer: Managed Care, Other (non HMO)

## 2023-05-07 DIAGNOSIS — F064 Anxiety disorder due to known physiological condition: Secondary | ICD-10-CM | POA: Diagnosis present

## 2023-05-07 DIAGNOSIS — G35 Multiple sclerosis: Secondary | ICD-10-CM | POA: Diagnosis not present

## 2023-05-07 MED ORDER — CALCIUM CARBONATE ANTACID 500 MG PO CHEW
2.0000 | CHEWABLE_TABLET | Freq: Four times a day (QID) | ORAL | Status: DC | PRN
  Administered 2023-05-07 – 2023-05-08 (×2): 400 mg via ORAL
  Filled 2023-05-07 (×2): qty 2

## 2023-05-07 MED ORDER — GABAPENTIN 300 MG PO CAPS
800.0000 mg | ORAL_CAPSULE | Freq: Three times a day (TID) | ORAL | Status: DC
  Administered 2023-05-07 – 2023-05-08 (×4): 800 mg via ORAL
  Filled 2023-05-07 (×4): qty 2

## 2023-05-07 MED ORDER — IOHEXOL 350 MG/ML SOLN
100.0000 mL | Freq: Once | INTRAVENOUS | Status: AC | PRN
  Administered 2023-05-07: 100 mL via INTRAVENOUS

## 2023-05-07 MED ORDER — CALCIUM CARBONATE ANTACID 500 MG PO CHEW
1.0000 | CHEWABLE_TABLET | Freq: Three times a day (TID) | ORAL | Status: DC | PRN
  Administered 2023-05-07: 200 mg via ORAL
  Filled 2023-05-07: qty 1

## 2023-05-07 MED ORDER — LORAZEPAM 2 MG/ML IJ SOLN
1.0000 mg | Freq: Once | INTRAMUSCULAR | Status: AC
  Administered 2023-05-07: 1 mg via INTRAVENOUS
  Filled 2023-05-07: qty 1

## 2023-05-07 MED ORDER — PROCHLORPERAZINE EDISYLATE 10 MG/2ML IJ SOLN
10.0000 mg | Freq: Once | INTRAMUSCULAR | Status: AC
  Administered 2023-05-07: 10 mg via INTRAVENOUS
  Filled 2023-05-07: qty 2

## 2023-05-07 MED ORDER — LORAZEPAM 2 MG/ML IJ SOLN
0.5000 mg | Freq: Four times a day (QID) | INTRAMUSCULAR | Status: DC | PRN
  Administered 2023-05-07: 1 mg via INTRAVENOUS
  Filled 2023-05-07: qty 1

## 2023-05-07 MED ORDER — NICOTINE 7 MG/24HR TD PT24
7.0000 mg | MEDICATED_PATCH | Freq: Every day | TRANSDERMAL | Status: DC
  Administered 2023-05-07: 7 mg via TRANSDERMAL
  Filled 2023-05-07 (×2): qty 1

## 2023-05-07 MED ORDER — LIDOCAINE 5 % EX PTCH
1.0000 | MEDICATED_PATCH | CUTANEOUS | Status: DC
  Administered 2023-05-07 – 2023-05-08 (×2): 1 via TRANSDERMAL
  Filled 2023-05-07 (×2): qty 1

## 2023-05-07 MED ORDER — DIPHENHYDRAMINE HCL 50 MG/ML IJ SOLN
25.0000 mg | Freq: Once | INTRAMUSCULAR | Status: AC
  Administered 2023-05-07: 25 mg via INTRAVENOUS
  Filled 2023-05-07: qty 1

## 2023-05-07 MED ORDER — DIAZEPAM 2 MG PO TABS: 2.0000 mg | ORAL_TABLET | Freq: Every day | ORAL | Status: DC | PRN

## 2023-05-07 MED ORDER — DIAZEPAM 5 MG/ML IJ SOLN
5.0000 mg | Freq: Two times a day (BID) | INTRAMUSCULAR | Status: DC
  Administered 2023-05-07 – 2023-05-08 (×2): 5 mg via INTRAVENOUS
  Filled 2023-05-07 (×2): qty 2

## 2023-05-07 MED ORDER — ACETAMINOPHEN 500 MG PO TABS
1000.0000 mg | ORAL_TABLET | Freq: Four times a day (QID) | ORAL | Status: DC | PRN
  Administered 2023-05-07 – 2023-05-08 (×4): 1000 mg via ORAL
  Filled 2023-05-07 (×4): qty 2

## 2023-05-07 MED ORDER — DIAZEPAM 5 MG/ML IJ SOLN
2.5000 mg | Freq: Once | INTRAMUSCULAR | Status: AC
  Administered 2023-05-07: 2.5 mg via INTRAVENOUS
  Filled 2023-05-07: qty 2

## 2023-05-07 MED ORDER — SODIUM CHLORIDE 0.9 % IV BOLUS
1000.0000 mL | Freq: Once | INTRAVENOUS | Status: AC
  Administered 2023-05-07: 1000 mL via INTRAVENOUS

## 2023-05-07 MED ORDER — VALPROATE SODIUM 100 MG/ML IV SOLN
1000.0000 mg | Freq: Once | INTRAVENOUS | Status: AC
  Administered 2023-05-07: 1000 mg via INTRAVENOUS
  Filled 2023-05-07: qty 10

## 2023-05-07 MED ORDER — TRAMADOL HCL 50 MG PO TABS
50.0000 mg | ORAL_TABLET | Freq: Two times a day (BID) | ORAL | Status: DC | PRN
  Administered 2023-05-07: 50 mg via ORAL
  Filled 2023-05-07: qty 1

## 2023-05-07 MED ORDER — DIAZEPAM 5 MG/ML IJ SOLN
2.5000 mg | Freq: Every day | INTRAMUSCULAR | Status: DC | PRN
  Administered 2023-05-07: 2.5 mg via INTRAVENOUS
  Filled 2023-05-07: qty 2

## 2023-05-07 MED ORDER — LORAZEPAM 2 MG/ML IJ SOLN
0.5000 mg | Freq: Four times a day (QID) | INTRAMUSCULAR | Status: DC | PRN
  Administered 2023-05-07: 0.5 mg via INTRAVENOUS
  Filled 2023-05-07: qty 1

## 2023-05-07 NOTE — Progress Notes (Signed)
Progress Note   Patient: Damon Blair ZOX:[id number] DOB: 08-05-1990 DOA: 05/04/2023     3 DOS: the patient was seen and examined on 05/07/2023   Brief hospital course: HPI on admission 10/18:  Damon Blair is a 32 y.o. male with medical history significant of MS diagnosed in 2020, anxiety, ADHD presented to the ED today with symptoms of MS flare that progressed over about two weeks.  He reports pain behind the eyes, vision changes with blurriness, headaches with light sensitivity and mild nausea (no vomiting), numbness in bilateral arms, leg heaviness and paresthesias to the right leg.  Also with facial numbness.  Symptoms are somewhat similar in nature to prior to his diagnosis.  He reports a recent upper respiratory illness, was tested and negative for Covid. Those symptoms have improved.   No other recent illnesses or symptoms on ROS.   ED course - afebrile, HR 95, RR 20, BP 152/95, spO2 98% on RA. Labs including BMP and CBC notable only for glucose 124. Treated with IV Solu-medrol, IV Protonix, IV Toradol, IV Valium.   Pt is admitted for further evaluation and management of acute MS flare with Neurology consulted.   Further hospital course and management as outlined below.   Assessment and Plan: * MS (multiple sclerosis) (HCC) With Acute Flare Follows at [facility] with Dr. Elwyn Reach, on Copaxone for DMT, acute nystagmus was initial sx. No new lesions seen on MRI brain or MRI C-spine. --1g IV Solu-medrol in ED --Neurology consulted - appreciate recommendations --Continue IV steroids --IV Protonix for GI protection --Resumed home Valium, baclofen, naproxen, Zanaflex, gabapentin --Further recs or changes per Neurology'  Headaches related to MS flare --Tylenol and PO NSAID  If refractory: --IV Compazine and Benadryl --IV Toradol OR Naproxen (hold this is Toradol being used)' --Avoid opioids  Anxiety disorder due to general medical condition with panic attack Pt having  increased anxiety and near panic attacks, related to current situation, MS flare which is impacting his vision, intractable headaches.  High dose IV steroids also contribute. --IV Ativan PRN --Appreciate Neurology's input  ADHD (attention deficit hyperactivity disorder), combined type Continue home Adderall        Subjective: Pt seen with family at bedside today.  He continues to have severe headaches, now mostly involving the left side of his head and face.  Also severe photophobia, worse on left, wearing left eye patch. R eye vision is blurry and pt anxious about recovering his sight.  He is active, Biomedical scientist.  Having severe anxiety and panic attacks today as well.     Physical Exam: Vitals:   05/06/23 1556 05/06/23 1924 05/07/23 0408 05/07/23 0812  BP: 121/67 113/60 128/80 123/68  Pulse: 76 (!) 59 84 65  Resp: 17 19 17 16   Temp: 98.2 F (36.8 C) 98 F (36.7 C) 97.8 F (36.6 C) 97.7 F (36.5 C)  TempSrc:   Oral   SpO2: 98% 100% 99% 98%  Weight:      Height:       General exam: awake, alert, no acute distress HEENT: wearing eye patch on left, hearing grossly normal  Respiratory system:on room air, normal respiratory effort. Cardiovascular system: RRR no pedal edema.   Central nervous system: A&O x 4. normal speech, grossly non-focal Extremities: no edema, normal tone Psychiatry: normal mood, congruent affect, judgement and insight appear normal   Data Reviewed:  No new labs today  MRI brain & cervical spine -- no new MS lesions seen. Chronic degenerative changes of  C-spine - see reports   Family Communication: At bedside on rounds  Disposition: Status is: Inpatient Remains inpatient appropriate because: on IV steroids for 5 day course, intractable headaches, requires IV therapies   Planned Discharge Destination: Home    Time spent: 55 minutes including time at bedside and in coordination of care with staff and consultant/s  Author: Pennie Banter, DO 05/07/2023 2:04 PM  For on call review [email or web address].

## 2023-05-07 NOTE — Assessment & Plan Note (Signed)
Pt having increased anxiety and near panic attacks, related to current situation, MS flare which is impacting his vision, intractable headaches.  High dose IV steroids also contribute. --IV Ativan PRN --Appreciate Neurology's input

## 2023-05-07 NOTE — Plan of Care (Signed)
  Problem: Education: Goal: Knowledge of General Education information will improve Description: Including pain rating scale, medication(s)/side effects and non-pharmacologic comfort measures Outcome: Progressing   Problem: Health Behavior/Discharge Planning: Goal: Ability to manage health-related needs will improve Outcome: Progressing   Problem: Clinical Measurements: Goal: Ability to maintain clinical measurements within normal limits will improve Outcome: Progressing Goal: Will remain free from infection Outcome: Progressing Goal: Diagnostic test results will improve Outcome: Progressing Goal: Respiratory complications will improve Outcome: Progressing Goal: Cardiovascular complication will be avoided Outcome: Progressing   Problem: Activity: Goal: Risk for activity intolerance will decrease Outcome: Progressing   Problem: Nutrition: Goal: Adequate nutrition will be maintained Outcome: Progressing   Problem: Pain Managment: Goal: General experience of comfort will improve Outcome: Progressing

## 2023-05-07 NOTE — Progress Notes (Signed)
Patient has been complaining constantly of either heart burn, anxiety, head ache and pain behind his eyes. Had message the provider 3 time requesting medication, apparently noting is working.. he requesting to see the neurologist, I ask if any of the symptom is new since his admission and he stated no. Message was sent to provider to come see pt per request because I have done all I could do and patient continue with the same complaint plus his anxiety continue to point of him tearing up because he is upset that his iv was not disconnected fast enough for him to go to the bathroom, even though he have a urinal. Emotional support given to the patient.

## 2023-05-08 DIAGNOSIS — M4802 Spinal stenosis, cervical region: Secondary | ICD-10-CM | POA: Insufficient documentation

## 2023-05-08 DIAGNOSIS — G35 Multiple sclerosis: Secondary | ICD-10-CM | POA: Diagnosis not present

## 2023-05-08 LAB — CBC
HCT: 31.7 % — ABNORMAL LOW (ref 39.0–52.0)
Hemoglobin: 10.8 g/dL — ABNORMAL LOW (ref 13.0–17.0)
MCH: 29 pg (ref 26.0–34.0)
MCHC: 34.1 g/dL (ref 30.0–36.0)
MCV: 85.2 fL (ref 80.0–100.0)
Platelets: 192 10*3/uL (ref 150–400)
RBC: 3.72 MIL/uL — ABNORMAL LOW (ref 4.22–5.81)
RDW: 12.2 % (ref 11.5–15.5)
WBC: 12.9 10*3/uL — ABNORMAL HIGH (ref 4.0–10.5)
nRBC: 0 % (ref 0.0–0.2)

## 2023-05-08 LAB — BASIC METABOLIC PANEL
Anion gap: 8 (ref 5–15)
BUN: 13 mg/dL (ref 6–20)
CO2: 26 mmol/L (ref 22–32)
Calcium: 8.8 mg/dL — ABNORMAL LOW (ref 8.9–10.3)
Chloride: 105 mmol/L (ref 98–111)
Creatinine, Ser: 0.8 mg/dL (ref 0.61–1.24)
GFR, Estimated: >60 mL/min (ref >60)
Glucose, Bld: 140 mg/dL — ABNORMAL HIGH (ref 70–99)
Potassium: 3.4 mmol/L — ABNORMAL LOW (ref 3.5–5.1)
Sodium: 139 mmol/L (ref 135–145)

## 2023-05-08 LAB — MAGNESIUM: Magnesium: 1.7 mg/dL (ref 1.7–2.4)

## 2023-05-08 MED ORDER — GABAPENTIN 800 MG PO TABS: 800.0000 mg | ORAL_TABLET | Freq: Three times a day (TID) | ORAL | 0 refills | Status: DC

## 2023-05-08 MED ORDER — DIAZEPAM 5 MG/ML IJ SOLN
5.0000 mg | Freq: Once | INTRAMUSCULAR | Status: AC
  Administered 2023-05-08: 5 mg via INTRAVENOUS
  Filled 2023-05-08: qty 2

## 2023-05-08 MED ORDER — MORPHINE SULFATE (PF) 2 MG/ML IV SOLN
2.0000 mg | Freq: Once | INTRAVENOUS | Status: AC
  Administered 2023-05-08: 2 mg via INTRAVENOUS
  Filled 2023-05-08: qty 1

## 2023-05-08 MED ORDER — TRAMADOL HCL 50 MG PO TABS
50.0000 mg | ORAL_TABLET | Freq: Two times a day (BID) | ORAL | Status: DC | PRN
  Administered 2023-05-08: 100 mg via ORAL
  Filled 2023-05-08: qty 2

## 2023-05-08 MED ORDER — CALCIUM CARBONATE ANTACID 500 MG PO CHEW: 2.0000 | CHEWABLE_TABLET | Freq: Four times a day (QID) | ORAL | Status: DC | PRN

## 2023-05-08 MED ORDER — TRAMADOL HCL 50 MG PO TABS: 50.0000 mg | ORAL_TABLET | Freq: Two times a day (BID) | ORAL | 0 refills | Status: DC | PRN

## 2023-05-08 NOTE — Progress Notes (Signed)
Patient constantly complaining of his head ache, eye pain, and blurry vision through the night, regardless of med given he states no relief. frequently requesting needs, charge nurse notified of patient issue, want me to call the neurologist to come see him even though symptoms are not new.  I informed provider, states to let him know that neurologist will see him in th morning. Warm compress given as well, Provider informed that patient stated that no medication given provide relief.

## 2023-05-08 NOTE — Evaluation (Signed)
Occupational Therapy Evaluation Patient Details Name: Damon Blair MRN: [id number] DOB: 06-Jun-1991 Today's Date: 05/08/2023   History of Present Illness 32 y.o. male with PMHx of MS diagnosed in 2020, anxiety, ADHD presented to the ED today with symptoms of MS flare that progressed over about two weeks. Symptoms include pain behind the eyes, vision changes with blurriness, headaches with light sensitivity and mild nausea (no vomiting), numbness in bilateral arms, leg heaviness and paresthesias to the right leg, facial numbness. Recent upper respiratory illness, negative for Covid.   Clinical Impression   Pt was seen for OT evaluation this date. Prior to hospital admission, pt was living on 2nd level apartment with 19 steps to 2nd level and HR in place. He was fully IND, driving, working, riding Network engineer, hiking, Catering manager. Wife and 21 year old son live with him, but wife works full time. Reports his mother will come from [place] to assist and care for son while he recovers.  Pt presents to acute OT demonstrating impaired ADL performance and functional mobility 2/2 headache/eye pain from vision deficits including sensitivity to light requiring use of eye patch and R eye blurring, mild balance deficits from vision. He is ambulating in his room with no AD with IND, demo shower transfer with IND and donning shoes for LB dressing with IND. He performed stair training to ascend/descend 20 steps using HR with eye patch removed for part of it and placed back on in more light d/t pain. HHA x1 needed shortly, then pt able to do on his own. Reports his wife will assist upon return home. Pt most anxious regarding his vision returning and him being able to return to work. He does not appear to require acute OT services and OT to sign off. May benefit from outpatient OT upon DC if vision does not improve from MS flare up.      If plan is discharge home, recommend the following: Assist for transportation;Assistance  with cooking/housework;Help with stairs or ramp for entrance    Functional Status Assessment  Patient has not had a recent decline in their functional status  Equipment Recommendations  None recommended by OT    Recommendations for Other Services       Precautions / Restrictions Precautions Precautions: Fall Restrictions Weight Bearing Restrictions: No      Mobility Bed Mobility Overal bed mobility: Independent               Patient Response: Anxious  Transfers Overall transfer level: Modified independent                 General transfer comment: slower than usual for safety d/t vision deficits, but able to ascend/descend 20 steps using one HR and occasional HHA to ensure safety/balance      Balance Overall balance assessment: Mild deficits observed, not formally tested                                         ADL either performed or assessed with clinical judgement   ADL Overall ADL's : Independent                                       General ADL Comments: able to don shoes seated at EOB, able to step into shower, etc     Vision  Patient Visual Report: Blurring of vision;Eye fatigue/eye pain/headache Vision Assessment?:  (impaired vision from MS flareup will follow up with neuro optometrist)     Perception         Praxis         Pertinent Vitals/Pain Pain Assessment Pain Assessment: Faces Faces Pain Scale: Hurts little more Pain Location: head behind L eye Pain Intervention(s): Monitored during session (wearing L eye patch)     Extremity/Trunk Assessment Upper Extremity Assessment Upper Extremity Assessment: Overall WFL for tasks assessed   Lower Extremity Assessment Lower Extremity Assessment: Overall WFL for tasks assessed       Communication Communication Communication: No apparent difficulties   Cognition Arousal: Alert Behavior During Therapy: WFL for tasks assessed/performed Overall  Cognitive Status: Within Functional Limits for tasks assessed                                       General Comments  able to ascend/descend 20 steps with HR use and occ HHA x1 d/t vision deficits    Exercises Other Exercises Other Exercises: Educated pt on pacing during all tasks to avoid falls from vision deficits   Shoulder Instructions      Home Living Family/patient expects to be discharged to:: Private residence Living Arrangements: Spouse/significant other;Children   Type of Home: Apartment Home Access: Stairs to enter Secretary/administrator of Steps: 19 Entrance Stairs-Rails: Right;Left Home Layout: One level     Bathroom Shower/Tub: Chief Strategy Officer: Standard                Prior Functioning/Environment Prior Level of Function : Independent/Modified Independent             Mobility Comments: IND, very active riding dirtbikes, hiking, working full time, etc. ADLs Comments: IND        OT Problem List: Pain;Impaired vision/perception      OT Treatment/Interventions:      OT Goals(Current goals can be found in the care plan section)    OT Frequency:      Co-evaluation              AM-PAC OT "6 Clicks" Daily Activity     Outcome Measure Help from another person eating meals?: None Help from another person taking care of personal grooming?: None Help from another person toileting, which includes using toliet, bedpan, or urinal?: None Help from another person bathing (including washing, rinsing, drying)?: None Help from another person to put on and taking off regular upper body clothing?: None Help from another person to put on and taking off regular lower body clothing?: None 6 Click Score: 24   End of Session Nurse Communication: Mobility status  Activity Tolerance: Patient tolerated treatment well Patient left: in bed;with call bell/phone within reach  OT Visit Diagnosis: Low vision, both eyes  (H54.2);Unsteadiness on feet (R26.81);Pain Pain - Right/Left: Left Pain - part of body:  (head/eye)                Time: 4098-1191 OT Time Calculation (min): 21 min Charges:  OT General Charges $OT Visit: 1 Visit OT Evaluation $OT Eval Low Complexity: 1 Low OT Treatments $Therapeutic Activity: 8-22 mins Arlee Santosuosso, OTR/L 05/08/23, 2:39 PM Leonilda Cozby E Nyzier Boivin 05/08/2023, 2:35 PM

## 2023-05-08 NOTE — Progress Notes (Signed)
Brief Nutrition Note  Received call from food and nutrition services. Pt is requesting multiple portions and meal trays.   Discussed with RN and MD; clarified that pt has no diet restrictions and is able to order multiple portions and meal trays without restriction. Updated diet order and communicated with food service staff.   Levada Schilling, RD, LDN, CDCES Registered Dietitian III Certified Diabetes Care and Education Specialist Please refer to [facility] for RD and/or RD on-call/weekend/after hours pager

## 2023-05-08 NOTE — Progress Notes (Signed)
Subjective: Continues to report headache which built up slowly over days but is now the worst headache he has ever had. He describes it as a sharp halo around the crown of his head that is slightly worse when he lays down. He is very anxious and says the solumedrol has him "crawling up the walls."  Objective: Current vital signs: BP 138/78 (BP Location: Right Arm)   Pulse 65   Temp 98.2 F (36.8 C)   Resp 18   Ht 5\' 9"  (1.753 m)   Wt 78.5 kg   SpO2 98%   BMI 25.56 kg/m  Vital signs in last 24 hours: Temp:  [98 F (36.7 C)-98.3 F (36.8 C)] 98.2 F (36.8 C) (10/22 0743) Pulse Rate:  [50-75] 65 (10/22 0743) Resp:  [18-19] 18 (10/22 0743) BP: (131-147)/(78-90) 138/78 (10/22 0743) SpO2:  [97 %-99 %] 98 % (10/22 0743)  Intake/Output from previous day: 10/21 0701 - 10/22 0700 In: 360 [P.O.:360] Out: -  Intake/Output this shift: No intake/output data recorded. Nutritional status:  Diet Order             Diet regular Fluid consistency: Thin  Diet effective now                   Physical Exam HEENT- [place]/AT   Lungs- Respirations unlabored Extremities- No edema   Neurological Examination Mental Status: Awake, alert and oriented. Anxious and pained affect. Thought content appropriate.  Speech fluent without evidence of aphasia.  Able to follow all commands without difficulty. Cranial Nerves: II: Deferred due to photophobia.  III,IV, VI: Deferred due to photophobia.  VII: Smile symmetric VIII: Hearing intact to voice IX,X: No hypophonia or hoarseness XI: Symmetric XII: No lingual dysarthria Motor: 5/5 x 4 without asymmetry.  Sensory: Decreased FT sensation to RUE and RLE. Cerebellar: No ataxia with FNF bilaterally Gait: Deferred   Lab Results: Results for orders placed or performed during the hospital encounter of 05/04/23 (from the past 48 hour(s))  Basic metabolic panel     Status: Abnormal   Collection Time: 05/08/23  4:37 AM  Result Value Ref Range    Sodium 139 135 - 145 mmol/L   Potassium 3.4 (L) 3.5 - 5.1 mmol/L   Chloride 105 98 - 111 mmol/L   CO2 26 22 - 32 mmol/L   Glucose, Bld 140 (H) 70 - 99 mg/dL    Comment: Glucose reference range applies only to samples taken after fasting for at least 8 hours.   BUN 13 6 - 20 mg/dL   Creatinine, Ser 1.61 0.61 - 1.24 mg/dL   Calcium 8.8 (L) 8.9 - 10.3 mg/dL   GFR, Estimated >09 >60 mL/min    Comment: (NOTE) Calculated using the CKD-EPI Creatinine Equation (2021)    Anion gap 8 5 - 15    Comment: Performed at [facility], [address] [place], [place] [address]  CBC     Status: Abnormal   Collection Time: 05/08/23  4:37 AM  Result Value Ref Range   WBC 12.9 (H) 4.0 - 10.5 K/uL   RBC 3.72 (L) 4.22 - 5.81 MIL/uL   Hemoglobin 10.8 (L) 13.0 - 17.0 g/dL   HCT 81.1 (L) 91.4 - 78.2 %   MCV 85.2 80.0 - 100.0 fL   MCH 29.0 26.0 - 34.0 pg   MCHC 34.1 30.0 - 36.0 g/dL   RDW 95.6 21.3 - 08.6 %   Platelets 192 150 - 400 K/uL   nRBC 0.0 0.0 -  0.2 %    Comment: Performed at [facility], [address] [place], [place] [address]  Magnesium     Status: None   Collection Time: 05/08/23  4:37 AM  Result Value Ref Range   Magnesium 1.7 1.7 - 2.4 mg/dL    Comment: Performed at [facility], [address] [place], [place] [address]    No results found for this or any previous visit (from the past 240 hour(s)).  Lipid Panel No results for input(s): "CHOL", "TRIG", "HDL", "CHOLHDL", "VLDL", "LDLCALC" in the last 72 hours.   Medications: Scheduled:  baclofen  40 mg Oral Daily   And   baclofen  20 mg Oral QHS   diazepam  5 mg Intravenous BID   dronabinol  10 mg Oral Daily   enoxaparin (LOVENOX) injection  40 mg Subcutaneous Q24H   gabapentin  800 mg Oral TID   lidocaine  1 patch Transdermal Q24H   loratadine  10 mg Oral Daily   naproxen  375 mg Oral BID   nicotine  7 mg Transdermal Daily   rosuvastatin  5 mg Oral Daily   Continuous:  methylPREDNISolone  (SOLU-MEDROL) injection 1,000 mg (05/07/23 1014)    Assessment: 32 y.o. male with a PMHx of multiple sclerosis diagnosed 4 years ago, formerly was followed by a physician in [place], now seeing MS specialist Dr. Elwyn Reach at [facility], on Copaxone for disease-modifying therapy, previous flare ups treated with steroids, who presents with symptoms of an MS exacerbation. He had been having vision problems and pain behind his eyes since Wednesday, as well as numbness to his arms for the past 2 weeks. He also complained of a sensation of heaviness involving both of his legs. He has had left face and arm paresthesias, which also are new. He is being treated with a 5 day course of IV Solumedrol.  - Exam today reveals improved RUE strength relative to admission. Right sided sensory deficit continues. Continues to have a headache which has been refractory to headache cocktail.  - MRI brain w/wo contrast: Unchanged T2 hyperintense lesions in the supratentorial and infratentorial brain, consistent with the patient's history of multiple sclerosis. No new lesion or abnormal enhancement to suggest active demyelination.  - MRI cervical spine w/wo contrast: Unchanged subtle T2 hyperintense foci in spinal cord, consistent with the patient's history of multiple sclerosis. No new lesion or abnormal enhancement to suggest active demyelination. Unchanged mild degenerative changes in the cervical spine, with mild spinal canal stenosis and mild right neural foraminal narrowing at C5-C6, and mild bilateral neural foraminal narrowing at C3-C4.  - Overall impression: Multiple sclerosis exacerbation     Recommendations: - Depakote IV 1g for headache - Continue IV Solumedrol 1000 mg every day for a total of 5 days. Last dose will be on Tuesday - CTA/CTV r/o vascular etiology for severe h/a - Eye dressing OS has been ordered for daytime hours as his photophobia is worse in his left eye. No conventional eye-patch (strapped black  single-eye patch) can be found on formulary.  - Schedule valium 5mg  bid while on solumedrol - Monitor CBG, BMP and CBC while on steroids - Continue Copaxone at discharge - PT/OT - Outpatient follow up with Dr. Elwyn Reach of [facility].   LOS: 4 days   Damon Neighbors, MD [facility] [phone]  If 7pm- 7am, please page neurology on call as listed in AMION.

## 2023-05-08 NOTE — Plan of Care (Signed)
  Problem: Education: Goal: Knowledge of General Education information will improve Description: Including pain rating scale, medication(s)/side effects and non-pharmacologic comfort measures Outcome: Progressing   Problem: Clinical Measurements: Goal: Ability to maintain clinical measurements within normal limits will improve Outcome: Progressing   Problem: Activity: Goal: Risk for activity intolerance will decrease Outcome: Progressing   Problem: Nutrition: Goal: Adequate nutrition will be maintained Outcome: Progressing   Problem: Coping: Goal: Level of anxiety will decrease Outcome: Progressing   Problem: Elimination: Goal: Will not experience complications related to bowel motility Outcome: Progressing   Problem: Safety: Goal: Ability to remain free from injury will improve Outcome: Progressing   Problem: Pain Managment: Goal: General experience of comfort will improve Outcome: Not Progressing  Patient states that noting given worked

## 2023-05-08 NOTE — Discharge Summary (Signed)
Physician Discharge Summary   Patient: Damon Blair MRN: [id number] DOB: Mar 26, 1991  Admit date:     05/04/2023  Discharge date: 05/08/2023  Discharge Physician: Pennie Banter   PCP: Enid Baas, MD   Recommendations at discharge:    Follow up with Neurology Follow up with Ophthalmology Follow up with Neurosurgery for possible C-spine ESI  Discharge Diagnoses: Principal Problem:   MS (multiple sclerosis) (HCC) Active Problems:   ADHD (attention deficit hyperactivity disorder), combined type   Anxiety disorder due to general medical condition with panic attack   Cervical stenosis of spine  Resolved Problems:   * No resolved hospital problems. [facility] Course: HPI on admission 10/18:  Damon Blair is a 32 y.o. male with medical history significant of MS diagnosed in 2020, anxiety, ADHD presented to the ED today with symptoms of MS flare that progressed over about two weeks.  He reports pain behind the eyes, vision changes with blurriness, headaches with light sensitivity and mild nausea (no vomiting), numbness in bilateral arms, leg heaviness and paresthesias to the right leg.  Also with facial numbness.  Symptoms are somewhat similar in nature to prior to his diagnosis.  He reports a recent upper respiratory illness, was tested and negative for Covid. Those symptoms have improved.   No other recent illnesses or symptoms on ROS.   ED course - afebrile, HR 95, RR 20, BP 152/95, spO2 98% on RA. Labs including BMP and CBC notable only for glucose 124. Treated with IV Solu-medrol, IV Protonix, IV Toradol, IV Valium.   Pt is admitted for further evaluation and management of acute MS flare with Neurology consulted.  Further hospital course and management as outlined below.  10/22 --- pt doing relatively well today.  Completed final dose IV steroids.  Vision still blurry on right and photophobia on left.  Pt understandably anxious about recovery of his vision.    Pt has been reluctant for any use of opioid pain meds but reports tolerating tramadol without return of cravings or other issues.   Afternoon update -- pt requesting discharge home and is medically stable.      Assessment and Plan:  * MS (multiple sclerosis) (HCC) With Acute Flare Follows at [facility] with Dr. Elwyn Reach, on Copaxone for DMT, acute nystagmus was initial sx. No new lesions seen on MRI brain or MRI C-spine. --1g IV Solu-medrol in ED --Neurology consulted - appreciate recommendations --Treated with 5 days high dose IV steroids --IV Protonix for GI protection --Resumed home Valium, baclofen, naproxen, Zanaflex, gabapentin --Outpatient Neurology follow up --Outpatient Ophthalmology referral sent    Headaches related to MS flare --Tylenol and PO NSAID  If refractory: --IV Compazine and Benadryl --IV Toradol OR Naproxen (hold this is Toradol being used)' --Low dose tramadol helpful and pt reports acceptable risk without recurrent cravings with tramadol previously.  Very short course on d/c given ongoing severe headaches.  Increased gabapentin as well.   Anxiety disorder due to general medical condition with panic attack Pt having increased anxiety and near panic attacks, related to current situation, MS flare which is impacting his vision, intractable headaches.  High dose IV steroids also contribute. --IV Ativan PRN --Appreciate Neurology's input   ADHD (attention deficit hyperactivity disorder), combined type Continue home Adderall         Consultants: Neurology Procedures performed: None  Disposition: Home Diet recommendation:  Discharge Diet Orders (From admission, onward)     Start     Ordered   05/08/23 0000  Diet - low sodium heart healthy        05/08/23 1558           Regular diet DISCHARGE MEDICATION: Allergies as of 05/08/2023       Reactions   Clavulanic Acid    Dilaudid [hydromorphone]    Oxycodone Itching        Medication List      STOP taking these medications    dicyclomine 20 MG tablet Commonly known as: BENTYL   HYDROmorphone 2 MG tablet Commonly known as: DILAUDID   imiquimod 5 % cream Commonly known as: ALDARA   pregabalin 200 MG capsule Commonly known as: LYRICA       TAKE these medications    acetaminophen 325 MG tablet Commonly known as: TYLENOL Take 325 mg by mouth every 6 (six) hours as needed.   amphetamine-dextroamphetamine 5 MG tablet Commonly known as: ADDERALL Take 10 mg by mouth daily.   baclofen 20 MG tablet Commonly known as: LIORESAL Take 20 mg by mouth 3 (three) times daily. 40 am 20 pm   calcium carbonate 500 MG chewable tablet Commonly known as: TUMS - dosed in mg elemental calcium Chew 2 tablets (400 mg of elemental calcium total) by mouth 4 (four) times daily as needed for indigestion or heartburn.   cetirizine 10 MG tablet Commonly known as: ZYRTEC Take 10 mg by mouth as needed.   cyanocobalamin 1000 MCG/ML injection Commonly known as: VITAMIN B12 Inject 1,000 mcg into the muscle every 30 (thirty) days.   diazepam 5 MG tablet Commonly known as: VALIUM Take 5 mg by mouth as needed for anxiety.   dronabinol 5 MG capsule Commonly known as: MARINOL Take 10 mg by mouth daily.   gabapentin 800 MG tablet Commonly known as: NEURONTIN Take 1 tablet (800 mg total) by mouth 3 (three) times daily. What changed: when to take this   Glatiramer Acetate 40 MG/ML Sosy Inject into the skin. Weds,fri,sun   naproxen 375 MG tablet Commonly known as: NAPROSYN Take 1 tablet (375 mg total) by mouth 2 (two) times daily.   ondansetron 4 MG disintegrating tablet Commonly known as: ZOFRAN-ODT Take 1 tablet (4 mg total) by mouth every 6 (six) hours as needed for nausea or vomiting.   rosuvastatin 5 MG tablet Commonly known as: CRESTOR Take 5 mg by mouth daily.   tiZANidine 4 MG tablet Commonly known as: ZANAFLEX Take 8 mg by mouth 2 (two) times daily as needed.    traMADol 50 MG tablet Commonly known as: ULTRAM Take 1-2 tablets (50-100 mg total) by mouth every 12 (twelve) hours as needed for severe pain (pain score 7-10) or moderate pain (pain score 4-6).        Follow-up Information     Enid Baas, MD Follow up.   Specialty: Internal Medicine Why: Hospital follow up Contact information: [address] [place] [address] [phone]                Discharge Exam: Ceasar Mons Weights   05/04/23 1026 05/04/23 2131  Weight: 77.1 kg 78.5 kg   General exam: awake, alert, no acute distress HEENT: wearing left eye patch, moist mucus membranes, hearing grossly normal  Respiratory system: CTAB, no wheezes, rales or rhonchi, normal respiratory effort. Cardiovascular system: normal S1/S2,  RRR, no JVD, murmurs, rubs, gallops,  no pedal edema.   Gastrointestinal system: soft, NT, ND, no HSM felt, +bowel sounds. Central nervous system: A&O x 3. no gross focal neurologic deficits, normal speech  Extremities: moves all , no edema, normal tone Skin: dry, intact, normal temperature, normal color, No rashes, lesions or ulcers Psychiatry: normal mood, congruent affect, judgement and insight appear normal   Condition at discharge: stable  The results of significant diagnostics from this hospitalization (including imaging, microbiology, ancillary and laboratory) are listed below for reference.   Imaging Studies: CT VENOGRAM HEAD  Result Date: 05/08/2023 CLINICAL DATA:  History of multiple sclerosis, presents with pain behind eyes, blurry vision, headaches, light sensitivity, arm numbness, leg heaviness, and paresthesias to the right leg. Facial numbness. EXAM: CT ANGIOGRAPHY HEAD CT VENOGRAM HEAD TECHNIQUE: Multidetector CT imaging of the head was performed using the standard protocol during bolus administration of intravenous contrast. Multiplanar CT image reconstructions and MIPs were obtained to evaluate the vascular anatomy.  Contiguous axial images were obtained from the base of the skull through the vertex during the bolus administration of intravenous contrast using CTV timing. Multiplanar CT image reconstructions and MIPs were obtained to evaluate the venous anatomy. RADIATION DOSE REDUCTION: This exam was performed according to the departmental dose-optimization program which includes automated exposure control, adjustment of the mA and/or kV according to patient size and/or use of iterative reconstruction technique. CONTRAST:  OMNIPAQUE IOHEXOL 350 MG/ML SOLN COMPARISON:  Brain MRI 05/04/2023 FINDINGS: CT HEAD Brain: There is no acute intracranial hemorrhage, extra-axial fluid collection, or acute infarct. Parenchymal volume is normal. The ventricles are normal in size. Gray-white differentiation is preserved. No demyelinating lesions are better seen on recent brain MRI. The pituitary and suprasellar region are normal. There is no mass lesion. There is no mass effect or midline shift. Vascular: See below. Skull: Normal. Negative for fracture or focal lesion. Sinuses: Mild sinus the globes and orbits are unremarkable. Other: The mastoid air cells and middle ear cavities are clear. CTA HEAD Anterior circulation: The intracranial ICAs are normal. The bilateral MCAs and ACAS are normal. The anterior communicating artery is normal. There is no aneurysm or AVM. Posterior circulation: The bilateral V4 segments are normal. The basilar artery is normal. The major cerebellar arteries appear normal. The bilateral PCAs are normal. Bilateral posterior communicating arteries are identified. There is no aneurysm or AVM. Venous sinuses: See below. Anatomic variants: None. Review of the MIP images confirms the above findings. CTV HEAD The superior sagittal sinus, internal cerebral veins, vein of Galen, straight sinus, transverse sinuses, sigmoid sinuses, and jugular bulbs are patent without evidence of thrombus or significant stenosis. The  left transverse and sigmoid sinuses are hypoplastic relative to the right, a developmental variant. IMPRESSION: 1. No acute intracranial pathology on noncontrast head CT. The known demyelinating lesions are better seen on recent brain MRI. 2. Normal CTA and CTV head with no evidence of venous sinus thrombosis. Electronically Signed   By: Lesia Hausen M.D.   On: 05/08/2023 08:36   CT ANGIO HEAD W OR WO CONTRAST  Result Date: 05/08/2023 CLINICAL DATA:  History of multiple sclerosis, presents with pain behind eyes, blurry vision, headaches, light sensitivity, arm numbness, leg heaviness, and paresthesias to the right leg. Facial numbness. EXAM: CT ANGIOGRAPHY HEAD CT VENOGRAM HEAD TECHNIQUE: Multidetector CT imaging of the head was performed using the standard protocol during bolus administration of intravenous contrast. Multiplanar CT image reconstructions and MIPs were obtained to evaluate the vascular anatomy. Contiguous axial images were obtained from the base of the skull through the vertex during the bolus administration of intravenous contrast using CTV timing. Multiplanar CT image reconstructions and MIPs were obtained to evaluate  the venous anatomy. RADIATION DOSE REDUCTION: This exam was performed according to the departmental dose-optimization program which includes automated exposure control, adjustment of the mA and/or kV according to patient size and/or use of iterative reconstruction technique. CONTRAST:  OMNIPAQUE IOHEXOL 350 MG/ML SOLN COMPARISON:  Brain MRI 05/04/2023 FINDINGS: CT HEAD Brain: There is no acute intracranial hemorrhage, extra-axial fluid collection, or acute infarct. Parenchymal volume is normal. The ventricles are normal in size. Gray-white differentiation is preserved. No demyelinating lesions are better seen on recent brain MRI. The pituitary and suprasellar region are normal. There is no mass lesion. There is no mass effect or midline shift. Vascular: See below. Skull:  Normal. Negative for fracture or focal lesion. Sinuses: Mild sinus the globes and orbits are unremarkable. Other: The mastoid air cells and middle ear cavities are clear. CTA HEAD Anterior circulation: The intracranial ICAs are normal. The bilateral MCAs and ACAS are normal. The anterior communicating artery is normal. There is no aneurysm or AVM. Posterior circulation: The bilateral V4 segments are normal. The basilar artery is normal. The major cerebellar arteries appear normal. The bilateral PCAs are normal. Bilateral posterior communicating arteries are identified. There is no aneurysm or AVM. Venous sinuses: See below. Anatomic variants: None. Review of the MIP images confirms the above findings. CTV HEAD The superior sagittal sinus, internal cerebral veins, vein of Galen, straight sinus, transverse sinuses, sigmoid sinuses, and jugular bulbs are patent without evidence of thrombus or significant stenosis. The left transverse and sigmoid sinuses are hypoplastic relative to the right, a developmental variant. IMPRESSION: 1. No acute intracranial pathology on noncontrast head CT. The known demyelinating lesions are better seen on recent brain MRI. 2. Normal CTA and CTV head with no evidence of venous sinus thrombosis. Electronically Signed   By: Lesia Hausen M.D.   On: 05/08/2023 08:36   MR BRAIN W WO CONTRAST  Result Date: 05/05/2023 CLINICAL DATA:  12/02/2022 MRI head and cervical spine EXAM: MRI HEAD WITHOUT AND WITH CONTRAST MRI CERVICAL SPINE WITHOUT AND WITH CONTRAST TECHNIQUE: Multiplanar, multiecho pulse sequences of the brain and surrounding structures, and cervical spine, to include the craniocervical junction and cervicothoracic junction, were obtained without and with intravenous contrast. CONTRAST:  8mL GADAVIST GADOBUTROL 1 MMOL/ML IV SOLN COMPARISON:  None Available. FINDINGS: MRI HEAD FINDINGS Brain: No restricted diffusion to suggest acute or subacute infarct. No abnormal parenchymal or  meningeal enhancement. Redemonstrated T2 hyperintense foci in the periventricular, deep, and subcortical white matter, as well as in the pons and ventral left medulla unchanged from the prior exam. A lesion in the left globus pallidus may represent a demyelinating lesion, dilated perivascular space, or remote infarct; this appears unchanged from prior exam. No acute hemorrhage, mass, mass effect, or midline shift. No hydrocephalus or extra-axial collection. Normal pituitary and craniocervical junction. No hemosiderin deposition to suggest remote hemorrhage. Normal cerebral volume for age. Vascular: Normal arterial flow voids. Normal arterial and venous enhancement. Skull and upper cervical spine: Normal marrow signal. Sinuses/Orbits: Mucosal thickening in the ethmoid air cells and sphenoid sinuses. No acute finding in the orbits. Other: The mastoid air cells are well aerated. MRI CERVICAL SPINE FINDINGS Alignment: Straightening of the normal cervical lordosis. No listhesis. Vertebrae: No acute fracture, evidence of discitis, or suspicious osseous lesion. Cord: Redemonstrated subtle, hazy T2 hyperintense signal in the central cord at the level of C4 (series 8, image 20) and in the left hemicord at C5 (series 8, images 20 5-26). No abnormal enhancement. No new T2 hyperintense  signal. Otherwise normal in signal and morphology. Posterior Fossa, vertebral arteries, paraspinal tissues: Negative. Disc levels: C2-C3: No significant disc bulge. No spinal canal stenosis or neuroforaminal narrowing. C3-C4: Minimal disc bulge. Uncovertebral hypertrophy. Small central disc protrusion. No spinal canal stenosis. Mild bilateral neural foraminal narrowing. C4-C5: Minimal disc bulge. Uncovertebral and right facet hypertrophy. Small central disc protrusion with annular fissure. No spinal canal stenosis or neural foraminal narrowing. C5-C6: Mild disc bulge with right paracentral disc protrusion. Uncovertebral hypertrophy. Mild spinal  canal stenosis and mild right neural foraminal narrowing, unchanged. C6-C7: No significant disc bulge. No spinal canal stenosis or neuroforaminal narrowing. C7-T1: No significant disc bulge. No spinal canal stenosis or neuroforaminal narrowing. IMPRESSION: 1. Unchanged T2 hyperintense lesions in the supratentorial and infratentorial brain, consistent with the patient's history of multiple sclerosis. No new lesion or abnormal enhancement to suggest active demyelination. 2. Unchanged subtle T2 hyperintense foci in spinal cord, consistent with the patient's history of multiple sclerosis. No new lesion or abnormal enhancement to suggest active demyelination. 3. Unchanged mild degenerative changes in the cervical spine, with mild spinal canal stenosis and mild right neural foraminal narrowing at C5-C6, and mild bilateral neural foraminal narrowing at C3-C4. Electronically Signed   By: Wiliam Ke M.D.   On: 05/05/2023 00:21   MR CERVICAL SPINE W WO CONTRAST  Result Date: 05/05/2023 CLINICAL DATA:  12/02/2022 MRI head and cervical spine EXAM: MRI HEAD WITHOUT AND WITH CONTRAST MRI CERVICAL SPINE WITHOUT AND WITH CONTRAST TECHNIQUE: Multiplanar, multiecho pulse sequences of the brain and surrounding structures, and cervical spine, to include the craniocervical junction and cervicothoracic junction, were obtained without and with intravenous contrast. CONTRAST:  8mL GADAVIST GADOBUTROL 1 MMOL/ML IV SOLN COMPARISON:  None Available. FINDINGS: MRI HEAD FINDINGS Brain: No restricted diffusion to suggest acute or subacute infarct. No abnormal parenchymal or meningeal enhancement. Redemonstrated T2 hyperintense foci in the periventricular, deep, and subcortical white matter, as well as in the pons and ventral left medulla unchanged from the prior exam. A lesion in the left globus pallidus may represent a demyelinating lesion, dilated perivascular space, or remote infarct; this appears unchanged from prior exam. No acute  hemorrhage, mass, mass effect, or midline shift. No hydrocephalus or extra-axial collection. Normal pituitary and craniocervical junction. No hemosiderin deposition to suggest remote hemorrhage. Normal cerebral volume for age. Vascular: Normal arterial flow voids. Normal arterial and venous enhancement. Skull and upper cervical spine: Normal marrow signal. Sinuses/Orbits: Mucosal thickening in the ethmoid air cells and sphenoid sinuses. No acute finding in the orbits. Other: The mastoid air cells are well aerated. MRI CERVICAL SPINE FINDINGS Alignment: Straightening of the normal cervical lordosis. No listhesis. Vertebrae: No acute fracture, evidence of discitis, or suspicious osseous lesion. Cord: Redemonstrated subtle, hazy T2 hyperintense signal in the central cord at the level of C4 (series 8, image 20) and in the left hemicord at C5 (series 8, images 20 5-26). No abnormal enhancement. No new T2 hyperintense signal. Otherwise normal in signal and morphology. Posterior Fossa, vertebral arteries, paraspinal tissues: Negative. Disc levels: C2-C3: No significant disc bulge. No spinal canal stenosis or neuroforaminal narrowing. C3-C4: Minimal disc bulge. Uncovertebral hypertrophy. Small central disc protrusion. No spinal canal stenosis. Mild bilateral neural foraminal narrowing. C4-C5: Minimal disc bulge. Uncovertebral and right facet hypertrophy. Small central disc protrusion with annular fissure. No spinal canal stenosis or neural foraminal narrowing. C5-C6: Mild disc bulge with right paracentral disc protrusion. Uncovertebral hypertrophy. Mild spinal canal stenosis and mild right neural foraminal narrowing, unchanged. C6-C7: No  significant disc bulge. No spinal canal stenosis or neuroforaminal narrowing. C7-T1: No significant disc bulge. No spinal canal stenosis or neuroforaminal narrowing. IMPRESSION: 1. Unchanged T2 hyperintense lesions in the supratentorial and infratentorial brain, consistent with the  patient's history of multiple sclerosis. No new lesion or abnormal enhancement to suggest active demyelination. 2. Unchanged subtle T2 hyperintense foci in spinal cord, consistent with the patient's history of multiple sclerosis. No new lesion or abnormal enhancement to suggest active demyelination. 3. Unchanged mild degenerative changes in the cervical spine, with mild spinal canal stenosis and mild right neural foraminal narrowing at C5-C6, and mild bilateral neural foraminal narrowing at C3-C4. Electronically Signed   By: Wiliam Ke M.D.   On: 05/05/2023 00:21    Microbiology: Results for orders placed or performed in visit on 02/07/23  Microscopic Examination     Status: None   Collection Time: 02/07/23 11:25 AM   Urine  Result Value Ref Range Status   WBC, UA 0-5 0 - 5 /hpf Final   RBC, Urine None seen 0 - 2 /hpf Final   Epithelial Cells (non renal) 0-10 0 - 10 /hpf Final   Bacteria, UA None seen None seen/Few Final    Labs: CBC: Recent Labs  Lab 05/04/23 1031 05/08/23 0437  WBC 4.0 12.9*  HGB 13.3 10.8*  HCT 38.3* 31.7*  MCV 84.7 85.2  PLT 228 192   Basic Metabolic Panel: Recent Labs  Lab 05/04/23 1031 05/08/23 0437  NA 141 139  K 4.4 3.4*  CL 106 105  CO2 26 26  GLUCOSE 124* 140*  BUN 16 13  CREATININE 0.79 0.80  CALCIUM 9.8 8.8*  MG  --  1.7   Liver Function Tests: No results for input(s): "AST", "ALT", "ALKPHOS", "BILITOT", "PROT", "ALBUMIN" in the last 168 hours. CBG: No results for input(s): "GLUCAP" in the last 168 hours.  Discharge time spent: greater than 30 minutes.  Signed: Pennie Banter, DO [facility] 05/08/2023

## 2023-05-16 ENCOUNTER — Telehealth: Payer: Self-pay

## 2023-05-16 ENCOUNTER — Other Ambulatory Visit: Payer: Self-pay

## 2023-05-16 DIAGNOSIS — Z5321 Procedure and treatment not carried out due to patient leaving prior to being seen by health care provider: Secondary | ICD-10-CM | POA: Insufficient documentation

## 2023-05-16 DIAGNOSIS — H538 Other visual disturbances: Secondary | ICD-10-CM | POA: Insufficient documentation

## 2023-05-16 DIAGNOSIS — H5712 Ocular pain, left eye: Secondary | ICD-10-CM | POA: Insufficient documentation

## 2023-05-16 NOTE — ED Triage Notes (Addendum)
Pt reports being seen recently for MS flare up, pt has been having ongoing issue with left eye. Pt reports light sensitivity and blurry vision to left eye. 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

## 2023-05-17 ENCOUNTER — Telehealth: Payer: Self-pay

## 2023-05-17 ENCOUNTER — Emergency Department
Admission: EM | Admit: 2023-05-17 | Discharge: 2023-05-17 | Discharge status: Against Medical Advice | Payer: Managed Care, Other (non HMO) | Attending: Emergency Medicine | Admitting: Emergency Medicine

## 2023-05-17 NOTE — ED Notes (Signed)
Pt not seen in room.  Per MD Modesto Charon, pts dispo can be changed to elopement.

## 2023-05-18 ENCOUNTER — Inpatient Hospital Stay
Admission: RE | Admit: 2023-05-18 | Discharge: 2023-05-18 | Disposition: A | Discharge status: Home / Self Care | Payer: Self-pay | Source: Ambulatory Visit | Attending: Orthopedic Surgery | Admitting: Orthopedic Surgery

## 2023-05-18 ENCOUNTER — Other Ambulatory Visit: Payer: Self-pay | Admitting: Family Medicine

## 2023-05-18 ENCOUNTER — Telehealth: Payer: Self-pay

## 2023-05-18 DIAGNOSIS — Z049 Encounter for examination and observation for unspecified reason: Secondary | ICD-10-CM

## 2023-05-18 NOTE — Progress Notes (Deleted)
Referring Physician:  Pennie Banter, DO [address] [place],  [place] [address]  Primary Physician:  Enid Baas, MD  History of Present Illness: 05/18/2023*** Damon Blair has a history of MS, ADHD, hepatitis C, and anxiety.   History of heroin abuse. ***  Was in hospital recently for MS flare up- he had vision issues, numbness in arms, heaviness in legs, and left face/arm paresthesias.       Duration: *** Location: *** Quality: *** Severity: ***  Precipitating: aggravated by *** Modifying factors: made better by *** Weakness: none Timing: *** Bowel/Bladder Dysfunction: none  Conservative measures:  Physical therapy: ***  Multimodal medical therapy including regular antiinflammatories: valium, baclofen, naproxen, zanaflex, neurontin  Injections: *** epidural steroid injections  Past Surgery: ***  [name] has ***no symptoms of cervical myelopathy.  The symptoms are causing a significant impact on the patient's life.   Review of Systems:  A 10 point review of systems is negative, except for the pertinent positives and negatives detailed in the HPI.  Past Medical History: Past Medical History:  Diagnosis Date   Kidney stones    MS (multiple sclerosis) (HCC)     Past Surgical History: Past Surgical History:  Procedure Laterality Date   CYSTOSCOPY W/ RETROGRADES  11/18/2021   Procedure: CYSTOSCOPY WITH RETROGRADE PYELOGRAM;  Surgeon: Sondra Come, MD;  Location: [facility] ORS;  Service: Urology;;   CYSTOSCOPY/URETEROSCOPY/HOLMIUM LASER/STENT PLACEMENT Left 11/18/2021   Procedure: CYSTOSCOPY/URETEROSCOPY/HOLMIUM LASER/STENT PLACEMENT;  Surgeon: Sondra Come, MD;  Location: [facility] OR[facility];  Service: Urology;  Laterality: Left;   FRACTURE SURGERY Left     Allergies: Allergies as of 05/22/2023 - Review Complete 05/16/2023  Allergen Reaction Noted   Clavulanic acid  11/07/2019   Dilaudid [hydromorphone]  01/29/2023   Oxycodone Itching  12/25/2021    Medications: Outpatient Encounter Medications as of 05/22/2023  Medication Sig   acetaminophen (TYLENOL) 325 MG tablet Take 325 mg by mouth every 6 (six) hours as needed.   amphetamine-dextroamphetamine (ADDERALL) 5 MG tablet Take 10 mg by mouth daily.   baclofen (LIORESAL) 20 MG tablet Take 20 mg by mouth 3 (three) times daily. 40 am 20 pm   calcium carbonate (TUMS - DOSED IN MG ELEMENTAL CALCIUM) 500 MG chewable tablet Chew 2 tablets (400 mg of elemental calcium total) by mouth 4 (four) times daily as needed for indigestion or heartburn.   cetirizine (ZYRTEC) 10 MG tablet Take 10 mg by mouth as needed.   cyanocobalamin (VITAMIN B12) 1000 MCG/ML injection Inject 1,000 mcg into the muscle every 30 (thirty) days.   diazepam (VALIUM) 5 MG tablet Take 5 mg by mouth as needed for anxiety.   dronabinol (MARINOL) 5 MG capsule Take 10 mg by mouth daily.   gabapentin (NEURONTIN) 800 MG tablet Take 1 tablet (800 mg total) by mouth 3 (three) times daily.   Glatiramer Acetate 40 MG/ML SOSY Inject into the skin. Weds,fri,sun   naproxen (NAPROSYN) 375 MG tablet Take 1 tablet (375 mg total) by mouth 2 (two) times daily.   ondansetron (ZOFRAN-ODT) 4 MG disintegrating tablet Take 1 tablet (4 mg total) by mouth every 6 (six) hours as needed for nausea or vomiting.   rosuvastatin (CRESTOR) 5 MG tablet Take 5 mg by mouth daily.   tiZANidine (ZANAFLEX) 4 MG tablet Take 8 mg by mouth 2 (two) times daily as needed.   traMADol (ULTRAM) 50 MG tablet Take 1-2 tablets (50-100 mg total) by mouth every 12 (twelve) hours as needed for  severe pain (pain score 7-10) or moderate pain (pain score 4-6).   No facility-administered encounter medications on file as of 05/22/2023.    Social History: Social History   Tobacco Use   Smoking status: Former    Types: Cigarettes    Passive exposure: Never   Smokeless tobacco: Former    Types: Snuff  Vaping Use   Vaping status: Former  Substance Use Topics    Alcohol use: Not Currently   Drug use: Not Currently    Family Medical History: No family history on file.  Physical Examination: There were no vitals filed for this visit.  General: Patient is well developed, well nourished, calm, collected, and in no apparent distress. Attention to examination is appropriate.  Respiratory: Patient is breathing without any difficulty.   NEUROLOGICAL:     Awake, alert, oriented to person, place, and time.  Speech is clear and fluent. Fund of knowledge is appropriate.   Cranial Nerves: Pupils equal round and reactive to light.  Facial tone is symmetric.    *** ROM of cervical spine *** pain *** posterior cervical tenderness. *** tenderness in bilateral trapezial region.   *** ROM of lumbar spine *** pain *** posterior lumbar tenderness.   No abnormal lesions on exposed skin.   Strength: Side Biceps Triceps Deltoid Interossei Grip Wrist Ext. Wrist Flex.  R 5 5 5 5 5 5 5   L 5 5 5 5 5 5 5    Side Iliopsoas Quads Hamstring PF DF EHL  R 5 5 5 5 5 5   L 5 5 5 5 5 5    Reflexes are ***2+ and symmetric at the biceps, brachioradialis, patella and achilles.   Hoffman's is absent.  Clonus is not present.   Bilateral upper and lower extremity sensation is intact to light touch.     Gait is normal.   ***No difficulty with tandem gait.    Medical Decision Making  Imaging: MRI cervical spine dated 05/04/23:  Alignment: Straightening of the normal cervical lordosis. No listhesis.   Vertebrae: No acute fracture, evidence of discitis, or suspicious osseous lesion.   Cord: Redemonstrated subtle, hazy T2 hyperintense signal in the central cord at the level of C4 (series 8, image 20) and in the left hemicord at C5 (series 8, images 20 5-26). No abnormal enhancement. No new T2 hyperintense signal. Otherwise normal in signal and morphology.   Posterior Fossa, vertebral arteries, paraspinal tissues: Negative.   Disc levels:   C2-C3: No  significant disc bulge. No spinal canal stenosis or neuroforaminal narrowing.   C3-C4: Minimal disc bulge. Uncovertebral hypertrophy. Small central disc protrusion. No spinal canal stenosis. Mild bilateral neural foraminal narrowing.   C4-C5: Minimal disc bulge. Uncovertebral and right facet hypertrophy. Small central disc protrusion with annular fissure. No spinal canal stenosis or neural foraminal narrowing.   C5-C6: Mild disc bulge with right paracentral disc protrusion. Uncovertebral hypertrophy. Mild spinal canal stenosis and mild right neural foraminal narrowing, unchanged.   C6-C7: No significant disc bulge. No spinal canal stenosis or neuroforaminal narrowing.   C7-T1: No significant disc bulge. No spinal canal stenosis or neuroforaminal narrowing.   IMPRESSION: 1. Unchanged T2 hyperintense lesions in the supratentorial and infratentorial brain, consistent with the patient's history of multiple sclerosis. No new lesion or abnormal enhancement to suggest active demyelination. 2. Unchanged subtle T2 hyperintense foci in spinal cord, consistent with the patient's history of multiple sclerosis. No new lesion or abnormal enhancement to suggest active demyelination. 3. Unchanged mild degenerative changes in  the cervical spine, with mild spinal canal stenosis and mild right neural foraminal narrowing at C5-C6, and mild bilateral neural foraminal narrowing at C3-C4.     Electronically Signed   By: Wiliam Ke M.D.   On: 05/05/2023 00:21       I have personally reviewed the images and agree with the above interpretation.  Assessment and Plan: Mr. Lobdell is a pleasant 32 y.o. male has ***  Treatment options discussed with patient and following plan made:   - Order for physical therapy for *** spine ***. Patient to call to schedule appointment. *** - Continue current medications including ***. Reviewed dosing and side effects.  - Prescription for ***. Reviewed  dosing and side effects. Take with food.  - Prescription for *** to take prn muscle spasms. Reviewed dosing and side effects. Discussed this can cause drowsiness.  - MRI of *** to further evaluate *** radiculopathy. No improvement time or medications (***).  - Referral to PMR at [facility] to discuss possible *** injections.  - Will schedule phone visit to review MRI results once I get them back.   I spent a total of *** minutes in face-to-face and non-face-to-face activities related to this patient's care today including review of outside records, review of imaging, review of symptoms, physical exam, discussion of differential diagnosis, discussion of treatment options, and documentation.   Thank you for involving me in the care of this patient.   Drake Leach PA-C Dept. of Neurosurgery

## 2023-05-19 ENCOUNTER — Encounter: Payer: Self-pay | Admitting: Emergency Medicine

## 2023-05-19 ENCOUNTER — Other Ambulatory Visit: Payer: Self-pay

## 2023-05-19 ENCOUNTER — Emergency Department
Admission: EM | Admit: 2023-05-19 | Discharge: 2023-05-19 | Disposition: A | Discharge status: Home / Self Care | Payer: Managed Care, Other (non HMO) | Attending: Emergency Medicine | Admitting: Emergency Medicine

## 2023-05-19 DIAGNOSIS — H5712 Ocular pain, left eye: Secondary | ICD-10-CM | POA: Diagnosis not present

## 2023-05-19 DIAGNOSIS — F418 Other specified anxiety disorders: Secondary | ICD-10-CM | POA: Insufficient documentation

## 2023-05-19 DIAGNOSIS — R200 Anesthesia of skin: Secondary | ICD-10-CM | POA: Insufficient documentation

## 2023-05-19 DIAGNOSIS — R519 Headache, unspecified: Secondary | ICD-10-CM | POA: Insufficient documentation

## 2023-05-19 DIAGNOSIS — Z79891 Long term (current) use of opiate analgesic: Secondary | ICD-10-CM | POA: Insufficient documentation

## 2023-05-19 DIAGNOSIS — G894 Chronic pain syndrome: Secondary | ICD-10-CM | POA: Insufficient documentation

## 2023-05-19 MED ORDER — PROPARACAINE HCL 0.5 % OP SOLN
1.0000 [drp] | Freq: Once | OPHTHALMIC | Status: DC
  Filled 2023-05-19: qty 15

## 2023-05-19 MED ORDER — TETRACAINE HCL 0.5 % OP SOLN
1.0000 [drp] | Freq: Once | OPHTHALMIC | Status: AC
  Administered 2023-05-19: 1 [drp] via OPHTHALMIC
  Filled 2023-05-19: qty 4

## 2023-05-19 MED ORDER — DIPHENHYDRAMINE HCL 50 MG/ML IJ SOLN
25.0000 mg | Freq: Once | INTRAMUSCULAR | Status: AC
  Administered 2023-05-19: 25 mg via INTRAVENOUS
  Filled 2023-05-19: qty 1

## 2023-05-19 MED ORDER — KETOROLAC TROMETHAMINE 15 MG/ML IJ SOLN
15.0000 mg | Freq: Once | INTRAMUSCULAR | Status: AC
  Administered 2023-05-19: 15 mg via INTRAVENOUS
  Filled 2023-05-19: qty 1

## 2023-05-19 MED ORDER — FLUORESCEIN SODIUM 1 MG OP STRP
1.0000 | ORAL_STRIP | Freq: Once | OPHTHALMIC | Status: AC
  Administered 2023-05-19: 1 via OPHTHALMIC
  Filled 2023-05-19: qty 1

## 2023-05-19 MED ORDER — PROCHLORPERAZINE EDISYLATE 10 MG/2ML IJ SOLN: 10.0000 mg | Freq: Once | INTRAMUSCULAR | Status: DC

## 2023-05-19 MED ORDER — SODIUM CHLORIDE 0.9 % IV BOLUS
1000.0000 mL | Freq: Once | INTRAVENOUS | Status: AC
  Administered 2023-05-19: 1000 mL via INTRAVENOUS

## 2023-05-19 MED ORDER — METOCLOPRAMIDE HCL 5 MG/ML IJ SOLN
10.0000 mg | Freq: Once | INTRAMUSCULAR | Status: AC
  Administered 2023-05-19: 10 mg via INTRAVENOUS
  Filled 2023-05-19: qty 2

## 2023-05-19 NOTE — ED Triage Notes (Signed)
Pt to ED via POV for left eye pain. Pt states that he is having increased light sensitivity. Pt states that this has been going on for a few weeks but has gotten worse. Pt is currently in NAD.

## 2023-05-19 NOTE — ED Notes (Signed)
Patient ambulated with a steady gait and with his dark glasses on to hallway bathroom.

## 2023-05-19 NOTE — Discharge Instructions (Addendum)
Please follow-up with your neurologist on Tuesday as you have scheduled.  Please return for any new, worsening, or changing symptoms or any concerns.

## 2023-05-19 NOTE — ED Provider Notes (Signed)
[facility] Provider Note    Event Date/Time   First MD Initiated Contact with Patient 05/19/23 [phone]     (approximate)   History   Eye Pain   HPI  Damon Blair is a 32 y.o. male with a past medical history of multiple sclerosis diagnosed in 2020, now followed by Dr. Elwyn Reach at [facility], on Copaxone for disease modifying therapy, previous flareups treated with steroids who presents today for continued retro-orbital left eye pain and light sensitivity.  Patient reports that this began in mid October at which time he came to the emergency department and was admitted for what was deemed to be an MS flare.  Patient reports that normally the steroids worked for his MS flares, but this time it has not worked.  He reports that he has had continued left-sided headaches, left-sided facial numbness, and right arm numbness involving the and radial aspect of the forearm since October.  He reports that his retro-orbital pain is better in the morning when it is still dark outside, though as soon as the sun comes up it worsens again.  He has a remote history of IV drug use, reports that he has not used since 2018.  He reports that he has not had a fever or chills, though his wife has developed URI type symptoms over the past 3 days.  Patient reports that he saw ophthalmology the day after his discharge, 05/09/2023 and was given new glasses.  He was subsequently seen by ophthalmology again yesterday and was told that he had a normal-appearing optic nerve, and improvement of his visual acuity.  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/07/2023   MS (multiple sclerosis) (HCC) 05/04/2023   Hydronephrosis with urinary obstruction due to ureteral calculus 11/15/2021   Pyelonephritis of left kidney 11/15/2021   Acute  pyelonephritis 11/15/2021   Heroin abuse (HCC) 12/10/2020   Elevated blood pressure reading 05/27/2020   Urinary incontinence 11/08/2019   History of hepatitis C 10/08/2019   Spasticity 01/01/2019   Multiple sclerosis (HCC) 12/29/2018   Anxiety 05/31/2018   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   ADHD (attention deficit hyperactivity disorder), combined type 12/22/2015   Continuous cannabis use 12/22/2015   Acute upper respiratory infection 03/01/2015   Need for diphtheria-tetanus-pertussis (Tdap) vaccine 03/01/2015   Open wound of left hand 03/01/2015   Acute pharyngitis 03/01/2015           Physical Exam   Triage Vital Signs: ED Triage Vitals  Encounter Vitals Group     BP 05/19/23 0641 (!) 142/90     Systolic BP Percentile --      Diastolic BP Percentile --      Pulse Rate 05/19/23 0641 70     Resp 05/19/23 0641 16     Temp 05/19/23 0641 97.9 F (36.6 C)     Temp Source 05/19/23 0641 Oral     SpO2 05/19/23 0641 100 %     Weight 05/19/23 0642 185 lb (83.9 kg)     Height 05/19/23 0642 5\' 9"  (1.753 m)     Head Circumference --      Peak Flow --      Pain Score 05/19/23 0640 7     Pain Loc --      Pain Education --  Exclude from Growth Chart --     Most recent vital signs: Vitals:   05/19/23 0641  BP: (!) 142/90  Pulse: 70  Resp: 16  Temp: 97.9 F (36.6 C)  SpO2: 100%    Physical Exam Vitals and nursing note reviewed.  Constitutional:      General: Awake and alert.  Has sunglasses on    Appearance: Normal appearance. The patient is normal weight.  HENT:     Head: Normocephalic and atraumatic.     Mouth: Mucous membranes are moist.  Eyes:     General: PERRL. Normal EOMs        Right eye: No discharge.        Left eye: No discharge.     Conjunctiva/sclera: Conjunctivae normal.  Cardiovascular:     Rate and Rhythm: Normal rate and regular rhythm.     Pulses: Normal pulses.  Pulmonary:     Effort: Pulmonary effort is normal. No  respiratory distress.     Breath sounds: Normal breath sounds.  Abdominal:     Abdomen is soft. There is no abdominal tenderness. No rebound or guarding. No distention. Musculoskeletal:        General: No swelling. Normal range of motion.     Cervical back: Normal range of motion and neck supple.  Skin:    General: Skin is warm and dry.     Capillary Refill: Capillary refill takes less than 2 seconds.     Findings: No rash.  Neurological:     Mental Status: The patient is awake and alert.   Neurological: GCS 15 alert and oriented x3 Normal speech, no expressive or receptive aphasia or dysarthria Cranial nerves II through XII intact, subjective decrease sensation to left side of face, though he is able to feel my finger Normal visual fields 5 out of 5 strength in all 4 extremities with intact sensation throughout No extremity drift Normal finger-to-nose testing, no limb or truncal ataxia    ED Results / Procedures / Treatments   Labs (all labs ordered are listed, but only abnormal results are displayed) Labs Reviewed - No data to display   EKG     RADIOLOGY     PROCEDURES:  Critical Care performed:   Procedures   MEDICATIONS ORDERED IN ED: Medications  prochlorperazine (COMPAZINE) injection 10 mg (10 mg Intravenous Patient Refused/Not Given 05/19/23 0931)  fluorescein ophthalmic strip 1 strip (1 strip Left Eye Given 05/19/23 0751)  tetracaine (PONTOCAINE) 0.5 % ophthalmic solution 1 drop (1 drop Left Eye Given 05/19/23 0751)  ketorolac (TORADOL) 15 MG/ML injection 15 mg (15 mg Intravenous Given 05/19/23 0834)  metoCLOPramide (REGLAN) injection 10 mg (10 mg Intravenous Given 05/19/23 0835)  diphenhydrAMINE (BENADRYL) injection 25 mg (25 mg Intravenous Given 05/19/23 0833)  sodium chloride 0.9 % bolus 1,000 mL (0 mLs Intravenous Stopped 05/19/23 0931)     IMPRESSION / MDM / ASSESSMENT AND PLAN / ED COURSE  I reviewed the triage vital signs and the nursing  notes.   Differential diagnosis includes, but is not limited to, migraine, MS flare, cluster headache.  Patient is awake and alert, hemodynamically stable.  He is answering questions appropriately.  I reviewed the patient's chart.  He had a recent hospitalization for MS flare was treated with high-dose steroids.  He has had negative MRI, CTA, CTV.  He has been seen by neurology, MS specialist at [facility], and ophthalmology.  He had no new lesions noted on his MRI, CTA and CTV were normal.  Ophthalmology reports that there is no ophthalmologic abnormality.  Patient was treated with a migraine cocktail with significant improvement of his symptoms.  He is already had extensive workup and is seeing multiple specialists, no new imaging is warranted today given that these were just done.  He has no fever, altered mental status, nuchal rigidity to suggest encephalitis/meningitis.  Discussed with Dr. Cyril Loosen the patient's presenting symptoms and exam given his complex medical history, and Dr. Cyril Loosen agrees that he is appropriate for outpatient management at this time.  He has an appointment with neurology on Tuesday and I recommended that he keep this appointment.  We discussed return precautions in the meantime.  Patient understands and agrees with plan.  He was discharged in stable condition.   Patient's presentation is most consistent with acute complicated illness / injury requiring diagnostic workup.    FINAL CLINICAL IMPRESSION(S) / ED DIAGNOSES   Final diagnoses:  Acute intractable headache, unspecified headache type     Rx / DC Orders   ED Discharge Orders     None        Note:  This document was prepared using Dragon voice recognition software and may include unintentional dictation errors.   Keturah Shavers 05/19/23 1444    Jene Every, MD 05/19/23 [phone]

## 2023-05-22 ENCOUNTER — Ambulatory Visit: Payer: Managed Care, Other (non HMO) | Admitting: Orthopedic Surgery

## 2023-05-23 ENCOUNTER — Ambulatory Visit (INDEPENDENT_AMBULATORY_CARE_PROVIDER_SITE_OTHER): Payer: Managed Care, Other (non HMO) | Admitting: Orthopedic Surgery

## 2023-05-23 ENCOUNTER — Encounter: Payer: Self-pay | Admitting: Orthopedic Surgery

## 2023-05-23 VITALS — BP 136/84 | Ht 69.0 in | Wt 178.0 lb

## 2023-05-23 DIAGNOSIS — M5412 Radiculopathy, cervical region: Secondary | ICD-10-CM

## 2023-05-23 DIAGNOSIS — M4722 Other spondylosis with radiculopathy, cervical region: Secondary | ICD-10-CM

## 2023-05-23 DIAGNOSIS — G35 Multiple sclerosis: Secondary | ICD-10-CM | POA: Diagnosis not present

## 2023-05-23 DIAGNOSIS — M47812 Spondylosis without myelopathy or radiculopathy, cervical region: Secondary | ICD-10-CM

## 2023-05-23 DIAGNOSIS — M4802 Spinal stenosis, cervical region: Secondary | ICD-10-CM

## 2023-05-23 NOTE — Progress Notes (Signed)
Referring Physician:  Pennie Banter, DO [address] [place],  [place] [address]  Primary Physician:  Damon Baas, MD  History of Present Illness: 05/23/2023 [name] has a history of MS, ADHD, hepatitis C, and anxiety.   Was in hospital recently for MS flare up- he had vision issues, numbness in arms, heaviness in legs, and left face/arm paresthesias.   His vision has gotten some better and has seen the eye doctor. He continues with sensitivity to light.   Seen by neurology yesterday- looks like MRI of orbits was ordered.  He has constant neck pain with some radiation into left arm to his elbow. He has numbness/tingling in both hands. He has weakness in his right hand, especially in right thumb. Pain is better with ice. Pain is worse with moving his neck. No dexterity issues.   He is on tylenol, baclofen, naproxen, and neurontin.   Bowel/Bladder Dysfunction: he has some urinary leaking x years. No bowel incontinence.   Conservative measures:  Physical therapy: has not participated in  Multimodal medical therapy including regular antiinflammatories: valium, baclofen, naproxen, zanaflex, neurontin  Injections:  Cervical ESI in May with good relief (in [place])- Dr. Allena Katz [facility])  Past Surgery: no prior spinal surgeries   Damon Blair has no symptoms of cervical myelopathy.  The symptoms are causing a significant impact on the patient's life.   Review of Systems:  A 10 point review of systems is negative, except for the pertinent positives and negatives detailed in the HPI.  Past Medical History: Past Medical History:  Diagnosis Date   Kidney stones    MS (multiple sclerosis) (HCC)     Past Surgical History: Past Surgical History:  Procedure Laterality Date   CYSTOSCOPY W/ RETROGRADES  11/18/2021   Procedure: CYSTOSCOPY WITH RETROGRADE PYELOGRAM;  Surgeon: Sondra Come, MD;  Location: [facility] ORS;  Service: Urology;;    CYSTOSCOPY/URETEROSCOPY/HOLMIUM LASER/STENT PLACEMENT Left 11/18/2021   Procedure: CYSTOSCOPY/URETEROSCOPY/HOLMIUM LASER/STENT PLACEMENT;  Surgeon: Sondra Come, MD;  Location: [facility] ORS;  Service: Urology;  Laterality: Left;   FRACTURE SURGERY Left     Allergies: Allergies as of 05/23/2023 - Review Complete 05/23/2023  Allergen Reaction Noted   Clavulanic acid  11/07/2019   Dilaudid [hydromorphone]  01/29/2023   Oxycodone Itching 12/25/2021    Medications: Outpatient Encounter Medications as of 05/23/2023  Medication Sig   acetaminophen (TYLENOL) 325 MG tablet Take 325 mg by mouth every 6 (six) hours as needed.   amphetamine-dextroamphetamine (ADDERALL) 10 MG tablet Take 10 mg by mouth daily.   baclofen (LIORESAL) 20 MG tablet Take 20 mg by mouth 3 (three) times daily. 40 am 20 pm   cetirizine (ZYRTEC) 10 MG tablet Take 10 mg by mouth as needed.   cyanocobalamin (VITAMIN B12) 1000 MCG/ML injection Inject 1,000 mcg into the muscle every 30 (thirty) days.   diazepam (VALIUM) 5 MG tablet Take 5 mg by mouth as needed for anxiety.   dronabinol (MARINOL) 5 MG capsule Take 10 mg by mouth daily.   gabapentin (NEURONTIN) 800 MG tablet Take 1 tablet (800 mg total) by mouth 3 (three) times daily.   Glatiramer Acetate 40 MG/ML SOSY Inject into the skin. Weds,fri,sun   naproxen (NAPROSYN) 375 MG tablet Take 1 tablet (375 mg total) by mouth 2 (two) times daily.   rosuvastatin (CRESTOR) 5 MG tablet Take 5 mg by mouth daily.   [DISCONTINUED] calcium carbonate (TUMS - DOSED IN MG ELEMENTAL CALCIUM) 500 MG chewable tablet Chew  2 tablets (400 mg of elemental calcium total) by mouth 4 (four) times daily as needed for indigestion or heartburn.   [DISCONTINUED] ondansetron (ZOFRAN-ODT) 4 MG disintegrating tablet Take 1 tablet (4 mg total) by mouth every 6 (six) hours as needed for nausea or vomiting.   [DISCONTINUED] tiZANidine (ZANAFLEX) 4 MG tablet Take 8 mg by mouth 2 (two) times daily as needed.    [DISCONTINUED] traMADol (ULTRAM) 50 MG tablet Take 1-2 tablets (50-100 mg total) by mouth every 12 (twelve) hours as needed for severe pain (pain score 7-10) or moderate pain (pain score 4-6).   No facility-administered encounter medications on file as of 05/23/2023.    Social History: Social History   Tobacco Use   Smoking status: Some Days    Types: Cigarettes    Passive exposure: Never   Smokeless tobacco: Former    Types: Snuff  Vaping Use   Vaping status: Former  Substance Use Topics   Alcohol use: Not Currently   Drug use: Not Currently    Family Medical History: No family history on file.  Physical Examination: Vitals:   05/23/23 0902  BP: 136/84    General: Patient is well developed, well nourished, calm, collected, and in no apparent distress. Attention to examination is appropriate.  Respiratory: Patient is breathing without any difficulty.   NEUROLOGICAL:     Awake, alert, oriented to person, place, and time.  Speech is clear and fluent. Fund of knowledge is appropriate.   He is still light sensitive- lights in room were turned down.   Mild lower posterior cervical tenderness. No tenderness in bilateral trapezial region.   No abnormal lesions on exposed skin.   Strength: Side Biceps Triceps Deltoid Interossei Grip Wrist Ext. Wrist Flex.  R 5 5 5 5 5 5 5   L 5 5 5 5 5 5 5    Side Iliopsoas Quads Hamstring PF DF EHL  R 5 5 5 5 5 5   L 5 5 5 5 5 5    Reflexes are 1+ and symmetric at the biceps, brachioradialis, patella and achilles.   Hoffman's is absent.  Clonus is not present.   Bilateral upper and lower extremity sensation is intact to light touch, but diminished in right arm from elbow down to hand and both legs from knees down.     Gait is slow.     Medical Decision Making  Imaging: MRI cervical spine dated 05/04/23:  Alignment: Straightening of the normal cervical lordosis. No listhesis.   Vertebrae: No acute fracture, evidence of discitis,  or suspicious osseous lesion.   Cord: Redemonstrated subtle, hazy T2 hyperintense signal in the central cord at the level of C4 (series 8, image 20) and in the left hemicord at C5 (series 8, images 20 5-26). No abnormal enhancement. No new T2 hyperintense signal. Otherwise normal in signal and morphology.   Posterior Fossa, vertebral arteries, paraspinal tissues: Negative.   Disc levels:   C2-C3: No significant disc bulge. No spinal canal stenosis or neuroforaminal narrowing.   C3-C4: Minimal disc bulge. Uncovertebral hypertrophy. Small central disc protrusion. No spinal canal stenosis. Mild bilateral neural foraminal narrowing.   C4-C5: Minimal disc bulge. Uncovertebral and right facet hypertrophy. Small central disc protrusion with annular fissure. No spinal canal stenosis or neural foraminal narrowing.   C5-C6: Mild disc bulge with right paracentral disc protrusion. Uncovertebral hypertrophy. Mild spinal canal stenosis and mild right neural foraminal narrowing, unchanged.   C6-C7: No significant disc bulge. No spinal canal stenosis or neuroforaminal narrowing.  C7-T1: No significant disc bulge. No spinal canal stenosis or neuroforaminal narrowing.   IMPRESSION: 1. Unchanged T2 hyperintense lesions in the supratentorial and infratentorial brain, consistent with the patient's history of multiple sclerosis. No new lesion or abnormal enhancement to suggest active demyelination. 2. Unchanged subtle T2 hyperintense foci in spinal cord, consistent with the patient's history of multiple sclerosis. No new lesion or abnormal enhancement to suggest active demyelination. 3. Unchanged mild degenerative changes in the cervical spine, with mild spinal canal stenosis and mild right neural foraminal narrowing at C5-C6, and mild bilateral neural foraminal narrowing at C3-C4.     Electronically Signed   By: Wiliam Ke M.D.   On: 05/05/2023 00:21       I have personally  reviewed the images and agree with the above interpretation.  Assessment and Plan: Mr. Dais is a pleasant 32 y.o. male who has constant neck pain with some radiation into left arm to his elbow. He has numbness/tingling in both hands. He has weakness in his right hand, especially in right thumb.  Admitted recently for MS flare and vision changes. Is seeing eye doctor and neurology for this.   He has known T2 hyperintense foci in spinal cord at C4 and C5 that are unchanged. He has diffuse cervical spondylosis with mild bilateral foraminal stenosis C3-C4 and mild central stenosis C5-C6 with mild right foraminal stenosis.   He had previous relief with cervical ESI last year in [place].   Treatment options discussed with patient and following plan made:   - Referral to pain management ([facility]) to consider cervical injections.  - May revisit PT at some point- he would not be a good candidate now.  - ROI signed to get records from [facility] (Dr. Allena Katz) in [place]. He did his last ESI.  - Follow up with me in 6-8 weeks for phone visit.   I spent a total of 35 minutes in face-to-face and non-face-to-face activities related to this patient's care today including review of outside records, review of imaging, review of symptoms, physical exam, discussion of differential diagnosis, discussion of treatment options, and documentation.   Thank you for involving me in the care of this patient.   Drake Leach PA-C Dept. of Neurosurgery

## 2023-05-23 NOTE — Patient Instructions (Signed)
It was so nice to see you today. Thank you so much for coming in.    You have some wear and tear in your neck and this is likely causing your neck pain and may be contributing to your arm pain.   I want you to see pain management here in [place] (Dr. Cherylann Ratel) to discuss possible cervical injections. They should call you to schedule an appointment or you can call them at [phone].   I will work on getting your records from Dr. Allena Katz.   We have a phone follow up scheduled in January.   Please do not hesitate to call if you have any questions or concerns. You can also message me in MyChart.   Drake Leach PA-C [phone]     The physicians and staff at [facility] are committed to providing excellent care. You may receive a survey asking for feedback about your experience at our office. We value you your feedback and appreciate you taking the time to to fill it out. The [facility] leadership team is also available to discuss your experience in person, feel free to contact us [phone].

## 2023-05-24 ENCOUNTER — Ambulatory Visit
Admission: RE | Admit: 2023-05-24 | Discharge: 2023-05-24 | Disposition: A | Discharge status: Home / Self Care | Payer: Managed Care, Other (non HMO) | Attending: Emergency Medicine | Admitting: Emergency Medicine

## 2023-05-24 ENCOUNTER — Ambulatory Visit
Admission: RE | Admit: 2023-05-24 | Discharge: 2023-05-24 | Disposition: A | Discharge status: Home / Self Care | Payer: Managed Care, Other (non HMO) | Source: Ambulatory Visit | Attending: Emergency Medicine | Admitting: Emergency Medicine

## 2023-05-24 ENCOUNTER — Ambulatory Visit
Admission: EM | Admit: 2023-05-24 | Discharge: 2023-05-24 | Disposition: A | Discharge status: Home / Self Care | Payer: Managed Care, Other (non HMO) | Attending: Emergency Medicine | Admitting: Emergency Medicine

## 2023-05-24 DIAGNOSIS — R1012 Left upper quadrant pain: Secondary | ICD-10-CM

## 2023-05-24 DIAGNOSIS — B349 Viral infection, unspecified: Secondary | ICD-10-CM | POA: Diagnosis not present

## 2023-05-24 DIAGNOSIS — R051 Acute cough: Secondary | ICD-10-CM | POA: Diagnosis present

## 2023-05-24 LAB — POC COVID19/FLU A&B COMBO
Covid Antigen, POC: NEGATIVE
Influenza A Antigen, POC: NEGATIVE
Influenza B Antigen, POC: NEGATIVE

## 2023-05-24 LAB — POCT RAPID STREP A (OFFICE): Rapid Strep A Screen: NEGATIVE

## 2023-05-24 NOTE — Discharge Instructions (Addendum)
Go to the outpatient imaging center for your chest x-ray.  I will call you with the result.    The strep, COVID, and flu tests are negative.   Take Tylenol as needed for fever or discomfort.  Rest and keep yourself hydrated.    Follow up with your primary care provider tomorrow.  Go to the emergency department if you have worsening symptoms.

## 2023-05-24 NOTE — ED Provider Notes (Signed)
Damon Blair    CSN: [id number] Arrival date & time: 05/24/23  7829      History   Chief Complaint Chief Complaint  Patient presents with   Cough   Flank Pain    HPI Damon Blair is a 32 y.o. male.  Patient presents with 2-day history of sore throat, congestion, cough, body aches, diarrhea.  One loose stool today.  He also reports soreness in his left lateral lower ribs which radiates to his left upper abdomen; he is concerned this may be pneumonia.  No OTC medications taken today; took NyQuil last night.  No fever, dysuria, hematuria, chest pain, shortness of breath, vomiting, or other symptoms.  Patient was seen by neurosurgery yesterday for cervical spondylosis, cervical radiculopathy, multiple sclerosis.  Patient was seen at [facility] ED on 05/19/2023; diagnosed with acute headache; treated with migraine cocktail.  Patient was hospitalized at [facility] on 05/04/2023 for multiple sclerosis exacerbation and cervical stenosis of spine.  The history is provided by the patient and medical records.    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   MS (multiple sclerosis) (HCC) 05/04/2023   Hydronephrosis with urinary obstruction due to ureteral calculus 11/15/2021   Pyelonephritis of left kidney 11/15/2021   Acute pyelonephritis 11/15/2021   Heroin abuse (HCC) 12/10/2020   Elevated blood pressure reading 05/27/2020   Urinary incontinence 11/08/2019   History of hepatitis C 10/08/2019   Spasticity 01/01/2019   Multiple sclerosis (HCC) 12/29/2018   Anxiety 05/31/2018   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   ADHD (attention deficit hyperactivity disorder), combined type 12/22/2015   Continuous cannabis use 12/22/2015   Acute upper  respiratory infection 03/01/2015   Need for diphtheria-tetanus-pertussis (Tdap) vaccine 03/01/2015   Open wound of left hand 03/01/2015   Acute pharyngitis 03/01/2015    Past Surgical History:  Procedure Laterality Date   CYSTOSCOPY W/ RETROGRADES  11/18/2021   Procedure: CYSTOSCOPY WITH RETROGRADE PYELOGRAM;  Surgeon: Sondra Come, MD;  Location: [facility] ORS;  Service: Urology;;   CYSTOSCOPY/URETEROSCOPY/HOLMIUM LASER/STENT PLACEMENT Left 11/18/2021   Procedure: CYSTOSCOPY/URETEROSCOPY/HOLMIUM LASER/STENT PLACEMENT;  Surgeon: Sondra Come, MD;  Location: [facility] ORS;  Service: Urology;  Laterality: Left;   FRACTURE SURGERY Left        Home Medications    Prior to Admission medications   Medication Sig Start Date End Date Taking? Authorizing Provider  amphetamine-dextroamphetamine (ADDERALL) 10 MG tablet Take 10 mg by mouth daily. 08/30/20  Yes [provider]  baclofen (LIORESAL) 20 MG tablet Take 20 mg by mouth 3 (three) times daily. 40 am 20 pm   Yes [provider]  cetirizine (ZYRTEC) 10 MG tablet Take 10 mg by mouth as needed.   Yes [provider]  cyanocobalamin (VITAMIN B12) 1000 MCG/ML injection Inject 1,000 mcg into the muscle every 30 (thirty) days. 04/29/23  Yes [provider]  diazepam (VALIUM) 5 MG tablet Take 5 mg by mouth as needed for anxiety.   Yes [provider]  dronabinol (MARINOL) 5 MG capsule Take 10 mg by mouth daily.   Yes [provider]  gabapentin (NEURONTIN) 800 MG tablet Take 1 tablet (800 mg total) by mouth 3 (three) times daily. 05/08/23  Yes Pennie Banter, DO  Glatiramer Acetate 40 MG/ML SOSY Inject into the skin. Weds,fri,sun 07/23/20  Yes [provider]  naproxen (NAPROSYN) 375 MG tablet Take 1 tablet (375 mg total) by mouth 2 (two) times daily. 03/30/23  Yes Bing Neighbors, NP  rosuvastatin (CRESTOR) 5 MG tablet Take 5 mg by mouth daily. 03/02/23 03/01/24 Yes [provider]   acetaminophen (TYLENOL) 325 MG tablet Take 325 mg by mouth every 6 (six) hours as needed. 04/09/23 08/07/23  [provider]    Family History History reviewed. No pertinent family history.  Social History Social History   Tobacco Use   Smoking status: Some Days    Types: Cigarettes    Passive exposure: Never   Smokeless tobacco: Former    Types: Snuff  Vaping Use   Vaping status: Former  Substance Use Topics   Alcohol use: Not Currently   Drug use: Not Currently     Allergies   Clavulanic acid, Dilaudid [hydromorphone], and Oxycodone   Review of Systems Review of Systems  Constitutional:  Positive for chills. Negative for fever.  HENT:  Positive for congestion and sore throat. Negative for ear pain.   Respiratory:  Positive for cough. Negative for shortness of breath.   Cardiovascular:  Negative for chest pain and palpitations.  Gastrointestinal:  Positive for abdominal pain and diarrhea. Negative for vomiting.  Genitourinary:  Negative for dysuria and hematuria.     Physical Exam Triage Vital Signs ED Triage Vitals  Encounter Vitals Group     BP 05/24/23 0913 134/81     Systolic BP Percentile --      Diastolic BP Percentile --      Pulse Rate 05/24/23 0913 73     Resp 05/24/23 0913 18     Temp 05/24/23 0913 98.4 F (36.9 C)     Temp Source 05/24/23 0913 Oral     SpO2 05/24/23 0913 100 %     Weight --      Height --      Head Circumference --      Peak Flow --      Pain Score 05/24/23 0914 5     Pain Loc --      Pain Education --      Exclude from Growth Chart --    No data found.  Updated Vital Signs BP 134/81 (BP Location: Left Arm)   Pulse 73   Temp 98.4 F (36.9 C) (Oral)   Resp 18   SpO2 100%   Visual Acuity Right Eye Distance:   Left Eye Distance:   Bilateral Distance:    Right Eye Near:   Left Eye Near:    Bilateral Near:     Physical Exam Constitutional:      General: He is not in acute distress. HENT:     Right Ear:  Tympanic membrane normal.     Left Ear: Tympanic membrane normal.     Nose: Rhinorrhea present.     Mouth/Throat:     Mouth: Mucous membranes are moist.     Pharynx: Oropharynx is clear.  Cardiovascular:     Rate and Rhythm: Normal rate and regular rhythm.     Heart sounds: Normal heart sounds.  Pulmonary:     Effort: Pulmonary effort is normal. No respiratory distress.     Breath sounds: Normal breath sounds.  Abdominal:     General: Bowel sounds are normal.     Palpations: Abdomen is soft.     Tenderness: There is abdominal  tenderness in the left upper quadrant. There is no right CVA tenderness, left CVA tenderness, guarding or rebound.     Comments: Mild tenderness to palpation of left upper quadrant.  No rebound or guarding.  Skin:    General: Skin is warm and dry.     Findings: No bruising, erythema, lesion or rash.  Neurological:     Mental Status: He is alert.      UC Treatments / Results  Labs (all labs ordered are listed, but only abnormal results are displayed) Labs Reviewed  POCT RAPID STREP A (OFFICE)  POC COVID19/FLU A&B COMBO    EKG   Radiology DG Chest 2 View  Result Date: 05/24/2023 CLINICAL DATA:  Cough. EXAM: CHEST - 2 VIEW COMPARISON:  January 29, 2023. FINDINGS: The heart size and mediastinal contours are within normal limits. Both lungs are clear. The visualized skeletal structures are unremarkable. IMPRESSION: No active cardiopulmonary disease. Electronically Signed   By: Lupita Raider M.D.   On: 05/24/2023 13:02    Procedures Procedures (including critical care time)  Medications Ordered in UC Medications - No data to display  Initial Impression / Assessment and Plan / UC Course  I have reviewed the triage vital signs and the nursing notes.  Pertinent labs & imaging results that were available during my care of the patient were reviewed by me and considered in my medical decision making (see chart for details).    Viral illness, acute cough,  left upper quadrant abdominal pain.  Afebrile and vital signs are stable.  Patient declines transfer to the ED.  He request an outpatient chest x-ray as he is concerned for pneumonia.  CXR negative.  Rapid strep, flu, COVID negative.  Discussed symptomatic treatment including Tylenol, rest, hydration.  Instructed patient to follow-up with his PCP tomorrow.  ED precautions given.  Education provided on viral illness and abdominal pain.  Patient agrees to plan of care.  Final Clinical Impressions(s) / UC Diagnoses   Final diagnoses:  Viral illness  Acute cough  LUQ abdominal pain     Discharge Instructions      Go to the outpatient imaging center for your chest x-ray.  I will call you with the result.    The strep, COVID, and flu tests are negative.   Take Tylenol as needed for fever or discomfort.  Rest and keep yourself hydrated.    Follow up with your primary care provider tomorrow.  Go to the emergency department if you have worsening symptoms.           ED Prescriptions   None    PDMP not reviewed this encounter.   Mickie Bail, NP 05/24/23 1318

## 2023-05-24 NOTE — ED Triage Notes (Signed)
Sore throat, left flank pain, chest tightness, body aches x 2 days. Cough, congestion x 5 days. Taking Nyquil with no relief of symptoms.

## 2023-05-29 ENCOUNTER — Telehealth: Payer: Self-pay

## 2023-06-01 ENCOUNTER — Other Ambulatory Visit: Payer: Self-pay

## 2023-06-01 ENCOUNTER — Emergency Department
Admission: EM | Admit: 2023-06-01 | Discharge: 2023-06-01 | Disposition: A | Discharge status: Home / Self Care | Payer: Managed Care, Other (non HMO) | Attending: Emergency Medicine | Admitting: Emergency Medicine

## 2023-06-01 ENCOUNTER — Emergency Department: Payer: Managed Care, Other (non HMO)

## 2023-06-01 DIAGNOSIS — R300 Dysuria: Secondary | ICD-10-CM | POA: Diagnosis not present

## 2023-06-01 DIAGNOSIS — F419 Anxiety disorder, unspecified: Secondary | ICD-10-CM | POA: Diagnosis not present

## 2023-06-01 DIAGNOSIS — H538 Other visual disturbances: Secondary | ICD-10-CM | POA: Diagnosis not present

## 2023-06-01 DIAGNOSIS — G35 Multiple sclerosis: Secondary | ICD-10-CM | POA: Insufficient documentation

## 2023-06-01 DIAGNOSIS — R55 Syncope and collapse: Secondary | ICD-10-CM | POA: Insufficient documentation

## 2023-06-01 DIAGNOSIS — R42 Dizziness and giddiness: Secondary | ICD-10-CM | POA: Diagnosis present

## 2023-06-01 LAB — URINALYSIS, COMPLETE (UACMP) WITH MICROSCOPIC
Bacteria, UA: NONE SEEN
Bilirubin Urine: NEGATIVE
Glucose, UA: NEGATIVE mg/dL
Hgb urine dipstick: NEGATIVE
Ketones, ur: NEGATIVE mg/dL
Leukocytes,Ua: NEGATIVE
Nitrite: NEGATIVE
Protein, ur: NEGATIVE mg/dL
Specific Gravity, Urine: 1.002 — ABNORMAL LOW (ref 1.005–1.030)
Squamous Epithelial / HPF: 0 /[HPF] (ref 0–5)
WBC, UA: 0 WBC/hpf (ref 0–5)
pH: 8 (ref 5.0–8.0)

## 2023-06-01 LAB — BASIC METABOLIC PANEL
Anion gap: 12 (ref 5–15)
BUN: 12 mg/dL (ref 6–20)
CO2: 22 mmol/L (ref 22–32)
Calcium: 9.8 mg/dL (ref 8.9–10.3)
Chloride: 103 mmol/L (ref 98–111)
Creatinine, Ser: 0.77 mg/dL (ref 0.61–1.24)
GFR, Estimated: >60 mL/min (ref >60)
Glucose, Bld: 114 mg/dL — ABNORMAL HIGH (ref 70–99)
Potassium: 4.7 mmol/L (ref 3.5–5.1)
Sodium: 137 mmol/L (ref 135–145)

## 2023-06-01 LAB — CBC
HCT: 42.4 % (ref 39.0–52.0)
Hemoglobin: 14.3 g/dL (ref 13.0–17.0)
MCH: 29.7 pg (ref 26.0–34.0)
MCHC: 33.7 g/dL (ref 30.0–36.0)
MCV: 88 fL (ref 80.0–100.0)
Platelets: 261 10*3/uL (ref 150–400)
RBC: 4.82 MIL/uL (ref 4.22–5.81)
RDW: 11.7 % (ref 11.5–15.5)
WBC: 6.2 10*3/uL (ref 4.0–10.5)
nRBC: 0 % (ref 0.0–0.2)

## 2023-06-01 LAB — TROPONIN I (HIGH SENSITIVITY): Troponin I (High Sensitivity): <2 ng/L (ref <18)

## 2023-06-01 MED ORDER — LORAZEPAM 2 MG/ML IJ SOLN
1.0000 mg | Freq: Once | INTRAMUSCULAR | Status: AC
  Administered 2023-06-01: 1 mg via INTRAVENOUS
  Filled 2023-06-01: qty 1

## 2023-06-01 MED ORDER — METOCLOPRAMIDE HCL 5 MG/ML IJ SOLN
10.0000 mg | Freq: Once | INTRAMUSCULAR | Status: AC
  Administered 2023-06-01: 10 mg via INTRAVENOUS
  Filled 2023-06-01: qty 2

## 2023-06-01 MED ORDER — DIPHENHYDRAMINE HCL 50 MG/ML IJ SOLN: 50.0000 mg | Freq: Once | INTRAMUSCULAR | Status: DC

## 2023-06-01 MED ORDER — KETOROLAC TROMETHAMINE 30 MG/ML IJ SOLN
15.0000 mg | Freq: Once | INTRAMUSCULAR | Status: AC
  Administered 2023-06-01: 15 mg via INTRAVENOUS
  Filled 2023-06-01: qty 1

## 2023-06-01 MED ORDER — SODIUM CHLORIDE 0.9 % IV BOLUS
1000.0000 mL | Freq: Once | INTRAVENOUS | Status: AC
  Administered 2023-06-01: 1000 mL via INTRAVENOUS

## 2023-06-01 NOTE — ED Triage Notes (Signed)
Pt to ED for multiple complaints. Reports "fainting spells", chest tightness, jaw pain, urinary retention, blurry vision when not wearing glasses, light sensitivity, anxiety. Denies shob, states feels not breathing properly and falling into unconsciousness. NAD noted. Pt appears anxious in triage. Denies pain.

## 2023-06-01 NOTE — Discharge Instructions (Addendum)
Please follow-up with your primary care doctor as well as neurologist for recheck/reevaluation.  Please obtain your MRI as scheduled your doctor.  Return to the emergency department for any symptom concerning to yourself.

## 2023-06-01 NOTE — ED Provider Notes (Signed)
[facility] Provider Note    Event Date/Time   First MD Initiated Contact with Patient 06/01/23 1103     (approximate)  History   Chief Complaint: multiple complaints  HPI  Damon Blair is a 32 y.o. male with a past medical history of multiple sclerosis, kidney stones, presents to the emergency department multiple complaints.  According to the patient yesterday in the shower he had a near syncopal event where he got very dizzy and lightheaded.  He states since that time he has been feeling "disoriented."  However patient then clarifies to say that over the last few months he has been feeling disoriented, he relates this to his MS symptoms.  States he was last hospitalized in October at that time patient had an MRI showing no significant finding patient completed a outpatient course of steroids.  Patient states he has been following up with his neurologist at [facility] given his intermittent eye symptoms concerning for possible optic neuritis.  They are holding off on additional steroids at this time and have an MRI scheduled on 06/06/2023.  Patient also states he has noticed a mild smell to his urine and feels like his urine is darker.  At times he has had some pain in his left flank where he has had a prior kidney stone several years ago per patient.  No obvious hematuria.  No fever.  Patient does state significant anxiety as well as significant increase in his stress recently which he admits could be contributing to his symptoms.  Physical Exam   Triage Vital Signs: ED Triage Vitals  Encounter Vitals Group     BP 06/01/23 1001 (!) 137/100     Systolic BP Percentile --      Diastolic BP Percentile --      Pulse Rate 06/01/23 1001 (!) 118     Resp 06/01/23 1001 20     Temp 06/01/23 1001 98.2 F (36.8 C)     Temp src --      SpO2 06/01/23 1001 100 %     Weight 06/01/23 1002 176 lb 5.9 oz (80 kg)     Height 06/01/23 1002 5\' 9"  (1.753 m)     Head Circumference --       Peak Flow --      Pain Score 06/01/23 1002 0     Pain Loc --      Pain Education --      Exclude from Growth Chart --     Most recent vital signs: Vitals:   06/01/23 1001  BP: (!) 137/100  Pulse: (!) 118  Resp: 20  Temp: 98.2 F (36.8 C)  SpO2: 100%    General: Awake, no distress.  CV:  Good peripheral perfusion.  Regular rate and rhythm  Resp:  Normal effort.  Equal breath sounds bilaterally.  Abd:  No distention.  Soft, nontender.  No rebound or guarding.  ED Results / Procedures / Treatments   EKG  EKG viewed and interpreted by myself shows sinus tachycardia 118 bpm with a narrow QRS, normal axis, normal intervals, nonspecific but no concerning ST changes.  RADIOLOGY  I have reviewed and interpreted chest x-ray images.  No consolidation on my evaluation.    MEDICATIONS ORDERED IN ED: Medications  ketorolac (TORADOL) 30 MG/ML injection 15 mg (has no administration in time range)  metoCLOPramide (REGLAN) injection 10 mg (has no administration in time range)  sodium chloride 0.9 % bolus 1,000 mL (has no administration in time  range)  LORazepam (ATIVAN) injection 1 mg (has no administration in time range)     IMPRESSION / MDM / ASSESSMENT AND PLAN / ED COURSE  I reviewed the triage vital signs and the nursing notes.  Patient's presentation is most consistent with acute presentation with potential threat to life or bodily function.  Patient presents emergency department with multiple complaints.  Patient's first complaint is a near syncopal episode in the shower yesterday.  Patient's lab work today is reassuring showing a normal CBC normal chemistry and a negative troponin.  EKG shows some tachycardia but otherwise reassuring.  Unclear the cause of the patient's near syncopal episode could be orthostatic or vagal induced.  Given the patient's reassuring EKG labs including troponin I believe the patient will be safe to follow-up as an outpatient regarding this  complaint.  Denies any chest pain or shortness of breath currently.  Given the patient's tachycardia again he is quite anxious and admits high stress and anxiety levels we will dose Ativan in addition to fluids.  Given the patient's second complaint of intermittent eye issues he states this has been an ongoing issue times several months.  He has followed up at [facility] he has been hospitalized in October for the same he has completed a course of steroids which he states mildly helped but not significantly.  He is seeing [facility] neurology for this and has an MRI scheduled on 11/20 which I believe is appropriate.  I would be hesitant to start the patient on steroids until he has his MRI completed.  Given the patient's third complaint of urinary changes including a mild smell and darker appearing urine.  Reassuringly patient's white blood cell count is normal renal function is normal.  Will obtain a urinalysis to further evaluate.  Given the patient's past history of ureterolithiasis if there is any blood in the urine we will proceed with a CT renal scan.  Patient agreeable to plan of care.  Patient states last time he was seen here for similar complaints he did well with a "migraine cocktail" of medications.  Will dose Toradol and Reglan IV fluids given the patient significant anxiety we will also dose a small dose of IV Ativan and reassess.  Patient agreeable to plan of care.  Patient states he is much better after medications.  Patient's urinalysis is normal with no RBCs or white blood cells or bacteria.  Chemistry CBC and troponin are normal chest x-ray is clear EKG reassuring besides mild tachycardia.  Patient appears much improved.  He will follow-up with his neurologist as well as MRI in 5 days as scheduled.  FINAL CLINICAL IMPRESSION(S) / ED DIAGNOSES   Visual changes Multiple sclerosis Dysuria Near syncope Anxiety   Note:  This document was prepared using Dragon voice recognition software and may include  unintentional dictation errors.   Minna Antis, MD 06/01/23 1359

## 2023-06-08 ENCOUNTER — Other Ambulatory Visit: Payer: Self-pay | Admitting: Internal Medicine

## 2023-06-08 ENCOUNTER — Telehealth: Payer: Self-pay

## 2023-06-08 DIAGNOSIS — G35A Relapsing-remitting multiple sclerosis: Secondary | ICD-10-CM

## 2023-06-08 DIAGNOSIS — G35 Multiple sclerosis: Secondary | ICD-10-CM

## 2023-06-13 ENCOUNTER — Other Ambulatory Visit: Payer: Self-pay | Admitting: Medical Genetics

## 2023-06-13 ENCOUNTER — Other Ambulatory Visit
Admission: RE | Admit: 2023-06-13 | Discharge: 2023-06-13 | Disposition: A | Discharge status: Home / Self Care | Payer: Self-pay | Source: Ambulatory Visit | Attending: Medical Genetics | Admitting: Medical Genetics

## 2023-06-19 ENCOUNTER — Ambulatory Visit
Payer: Managed Care, Other (non HMO) | Attending: Student in an Organized Health Care Education/Training Program | Admitting: Student in an Organized Health Care Education/Training Program

## 2023-06-19 ENCOUNTER — Encounter: Payer: Self-pay | Admitting: Student in an Organized Health Care Education/Training Program

## 2023-06-19 VITALS — BP 139/80 | HR 86 | Temp 98.6°F | Ht 69.0 in | Wt 185.0 lb

## 2023-06-19 DIAGNOSIS — M4802 Spinal stenosis, cervical region: Secondary | ICD-10-CM | POA: Insufficient documentation

## 2023-06-19 DIAGNOSIS — F902 Attention-deficit hyperactivity disorder, combined type: Secondary | ICD-10-CM | POA: Insufficient documentation

## 2023-06-19 DIAGNOSIS — G35 Multiple sclerosis: Secondary | ICD-10-CM | POA: Diagnosis present

## 2023-06-19 DIAGNOSIS — M5412 Radiculopathy, cervical region: Secondary | ICD-10-CM | POA: Insufficient documentation

## 2023-06-19 DIAGNOSIS — F419 Anxiety disorder, unspecified: Secondary | ICD-10-CM | POA: Diagnosis not present

## 2023-06-19 DIAGNOSIS — M5020 Other cervical disc displacement, unspecified cervical region: Secondary | ICD-10-CM | POA: Diagnosis present

## 2023-06-19 NOTE — Progress Notes (Signed)
Patient: Damon Blair  Service Category: E/M  Provider: Edward Jolly, MD  DOB: Sep 01, 1990  DOS: 06/19/2023  Referring Provider: Gardiner Rhyme  MRN: [id number]  Setting: Ambulatory outpatient  PCP: Enid Baas, MD  Type: New Patient  Specialty: Interventional Pain Management    Location: Office  Delivery: Face-to-face     Primary Reason(s) for Visit: Encounter for initial evaluation of one or more chronic problems (new to examiner) potentially causing chronic pain, and posing a threat to normal musculoskeletal function. (Level of risk: High) CC: Neck Pain (Neck and lower back)  HPI  Damon Blair is a 32 y.o. year old, male patient, who comes for the first time to our practice referred by Drake Leach, PA-C for our initial evaluation of his chronic pain. He has Hydronephrosis with urinary obstruction due to ureteral calculus; Pyelonephritis of left kidney; Multiple sclerosis (HCC); Acute pyelonephritis; Acute upper respiratory infection; ADHD (attention deficit hyperactivity disorder), combined type; Anxiety; Continuous cannabis use; Elevated blood pressure reading; Heroin abuse (HCC); History of hepatitis C; HSV (herpes simplex virus) infection; Need for diphtheria-tetanus-pertussis (Tdap) vaccine; Open wound of left hand; Spasticity; Urinary incontinence; Acute pharyngitis; MS (multiple sclerosis) (HCC); Anxiety disorder due to general medical condition with panic attack; Cervical stenosis of spine; Cervical radicular pain; and Protrusion of cervical intervertebral disc on their problem list. Today he comes in for evaluation of his Neck Pain (Neck and lower back)  Pain Assessment: Location:   Neck (lower back) Radiating: radiates down spine and left buttock Onset: More than a month ago Duration: Chronic pain Quality: Sharp, Shooting, Throbbing Severity: 6 /10 (subjective, self-reported pain score)  Effect on ADL: limits ADLS Timing: Constant Modifying factors: ice, stretching,  light walking BP: 139/80  HR: 86  Onset and Duration: Gradual and Present longer than 3 months Cause of pain:  MS diagnosis in 2020 Severity: Getting worse, NAS-11 at its worse: 7/10, NAS-11 at its best: 4/10, NAS-11 now: 6/10, and NAS-11 on the average: 5/10 Timing: Night, During activity or exercise, and After activity or exercise Aggravating Factors: Bending, Climbing, Kneeling, Lifiting, Motion, Squatting, and Working Alleviating Factors: Acupuncture, Stretching, Cold packs, Lying down, Medications, Resting, TENS, Relaxation therapy, and Walking Associated Problems: Fatigue, Inability to concentrate, Numbness, Spasms, Tingling, Weakness, and Pain that does not allow patient to sleep Quality of Pain: Aching, Intermittent, Deep, Feeling of constriction, Feeling of weight, Heavy, Nagging, Pressure-like, Sharp, Stabbing, Tender, and Uncomfortable Previous Examinations or Tests: CT scan, MRI scan, X-rays, Neurological evaluation, Neurosurgical evaluation, and Psychiatric evaluation Previous Treatments: Epidural steroid injections, Relaxation therapy, Strengthening exercises, and Stretching exercises  Damon Blair is being evaluated for possible interventional pain management therapies for the treatment of his chronic pain.  Discussed the use of AI scribe software for clinical note transcription with the patient, who gave verbal consent to proceed.  History of Present Illness   The patient, diagnosed with multiple sclerosis (MS) in 2020, presents with two primary complaints: neck pain and lower back pain. The lower back pain is described as a constant pressure, likened to an 'elephant pressing' on the area. This discomfort radiates around the side and shoots down the left leg, with minimal involvement of the right side. The neck pain is characterized by intermittent sharpness and stiffness, which worsens with neck movement. The patient also reports an 'electrical shock' sensation that travels  down the spine upon forward extension of the neck, a symptom known as Lhermitte's sign, which has been occurring intermittently over the past year.  The patient has  a history of successful pain management with epidural injections, the most recent of which was administered in May- cervical epidural steroid injection for cervical radicular pain. He also reports occasional grip strength weakness and uncontrollable muscle movements. The patient has a surgical history of a left fibula operation, which involved the placement of four screws and a plate.  The patient was recently hospitalized due to an MS exacerbation, which presented with a new eye-related issue causing heightened light sensitivity. During this hospitalization, an MRI was performed, revealing T2 hyperintense foci in the spinal cord at C4, C5, and diffuse cervical spondylosis with mild bilateral foraminal stenosis at C3, C4, and mild central stenosis at C5, C6 with mild right foraminal stenosis. The patient also reports mild lower back pain, which radiates down the lateral aspect of the left leg, potentially due to mild nerve compression at L3, L4.      Historic Controlled Substance Pharmacotherapy Review   Historical Monitoring: The patient  reports that he does not currently use drugs. List of prior UDS Testing: Lab Results  Component Value Date   MDMA NONE DETECTED 10/09/2022   COCAINSCRNUR NONE DETECTED 10/09/2022   PCPSCRNUR NONE DETECTED 10/09/2022   THCU POSITIVE (A) 10/09/2022   ETH <10 10/09/2022   Historical Background Evaluation: [place] PMP: PDMP reviewed during this encounter. Review of the past 40-months conducted.             [place] Department of public safety, offender search: Engineer, mining Information) Non-contributory   Pharmacologic Plan: Non-opioid analgesic therapy offered.              Meds   Current Outpatient Medications:    acetaminophen (TYLENOL) 325 MG tablet, Take 325 mg by mouth every 6 (six) hours as needed.,  Disp: , Rfl:    amphetamine-dextroamphetamine (ADDERALL) 10 MG tablet, Take 10 mg by mouth daily., Disp: , Rfl:    baclofen (LIORESAL) 20 MG tablet, Take 20 mg by mouth 3 (three) times daily. 40 am 20 pm, Disp: , Rfl:    cetirizine (ZYRTEC) 10 MG tablet, Take 10 mg by mouth as needed., Disp: , Rfl:    cyanocobalamin (VITAMIN B12) 1000 MCG/ML injection, Inject 1,000 mcg into the muscle every 30 (thirty) days., Disp: , Rfl:    diazepam (VALIUM) 5 MG tablet, Take 5 mg by mouth as needed for anxiety., Disp: , Rfl:    dronabinol (MARINOL) 5 MG capsule, Take 10 mg by mouth daily., Disp: , Rfl:    gabapentin (NEURONTIN) 800 MG tablet, Take 1 tablet (800 mg total) by mouth 3 (three) times daily., Disp: 60 tablet, Rfl: 0   Glatiramer Acetate 40 MG/ML SOSY, Inject into the skin. Weds,fri,sun, Disp: , Rfl:    naproxen (NAPROSYN) 375 MG tablet, Take 1 tablet (375 mg total) by mouth 2 (two) times daily., Disp: 20 tablet, Rfl: 0   rosuvastatin (CRESTOR) 5 MG tablet, Take 5 mg by mouth daily., Disp: , Rfl:   Imaging Review  Cervical Imaging: Cervical MR wo contrast: Results for orders placed during the hospital encounter of 12/02/22  MR CERVICAL SPINE WO CONTRAST  Narrative CLINICAL DATA:  Initial evaluation for acute myelopathy.  EXAM: MRI CERVICAL SPINE WITHOUT CONTRAST  TECHNIQUE: Multiplanar, multisequence MR imaging of the cervical spine was performed. No intravenous contrast was administered.  COMPARISON:  Prior study from 05/28/2022.  FINDINGS: Alignment: Straightening with slight reversal of the normal cervical lordosis. Underlying mild levoscoliosis. No listhesis.  Vertebrae: Vertebral body height maintained without acute or chronic fracture. Bone  marrow signal intensity within normal limits. No discrete or worrisome osseous lesions. No abnormal marrow edema.  Cord: Subtle hazy signal abnormality involving the central cord at the level of C4-5 (series 4, image 11). Additional  suspected subtle patchy signal abnormality within the left hemi cord at the level of C5 (series 4, image 15). Findings consistent with history of demyelinating disease. Overall, these changes are not significantly changed as compared to prior. No appreciable new lesions to suggest disease progression. No significant cord swelling or edema to suggest active demyelination on this noncontrast examination.  Posterior Fossa, vertebral arteries, paraspinal tissues: Unremarkable.  Disc levels:  C2-C3: Unremarkable.  C3-C4: Mild disc bulge with uncovertebral spurring. Superimposed tiny central disc protrusion indents the ventral thecal sac. Mild bilateral facet hypertrophy. No spinal stenosis. Mild right C4 foraminal narrowing. Left neural foramen remains patent.  C4-C5: Mild disc bulge with uncovertebral spurring. Superimposed tiny left paracentral disc protrusion with annular fissure indents the ventral thecal sac. Mild right-sided facet hypertrophy. No spinal stenosis. Foramina remain patent.  C5-C6: Broad-based right paracentral disc protrusion flattens and partially faces the ventral thecal sac. Mild cord flattening without cord signal changes. Mild spinal stenosis. Superimposed uncovertebral spurring with resultant mild right C6 foraminal narrowing. Left neural foramina remains patent.  C6-C7:  Unremarkable.  C7-T1:  Negative interspace.  Mild facet hypertrophy.  No stenosis.  IMPRESSION: 1. Subtle signal abnormality involving the cervical spinal cord at C4-5 and C5, consistent with history of demyelinating disease. Overall, appearance is not significantly changed as compared to previous MRI from 05/28/2022. No new lesions to suggest disease progression. No significant cord swelling or edema to suggest active demyelination on this noncontrast examination. 2. Right paracentral disc protrusion at C5-6 with resultant mild spinal stenosis, with mild right C6 foraminal  narrowing. 3. Additional mild spondylosis at C3-4 and C4-5 without spinal stenosis. Mild right C4 foraminal narrowing.   Electronically Signed By: Rise Mu M.D. On: 12/02/2022 23:05   MR CERVICAL SPINE W WO CONTRAST  Narrative CLINICAL DATA:  12/02/2022 MRI head and cervical spine  EXAM: MRI HEAD WITHOUT AND WITH CONTRAST  MRI CERVICAL SPINE WITHOUT AND WITH CONTRAST  TECHNIQUE: Multiplanar, multiecho pulse sequences of the brain and surrounding structures, and cervical spine, to include the craniocervical junction and cervicothoracic junction, were obtained without and with intravenous contrast.  CONTRAST:  8mL GADAVIST GADOBUTROL 1 MMOL/ML IV SOLN  COMPARISON:  None Available.  FINDINGS: MRI HEAD FINDINGS  Brain: No restricted diffusion to suggest acute or subacute infarct. No abnormal parenchymal or meningeal enhancement.  Redemonstrated T2 hyperintense foci in the periventricular, deep, and subcortical white matter, as well as in the pons and ventral left medulla unchanged from the prior exam. A lesion in the left globus pallidus may represent a demyelinating lesion, dilated perivascular space, or remote infarct; this appears unchanged from prior exam.  No acute hemorrhage, mass, mass effect, or midline shift. No hydrocephalus or extra-axial collection. Normal pituitary and craniocervical junction. No hemosiderin deposition to suggest remote hemorrhage. Normal cerebral volume for age.  Vascular: Normal arterial flow voids. Normal arterial and venous enhancement.  Skull and upper cervical spine: Normal marrow signal.  Sinuses/Orbits: Mucosal thickening in the ethmoid air cells and sphenoid sinuses. No acute finding in the orbits.  Other: The mastoid air cells are well aerated.  MRI CERVICAL SPINE FINDINGS  Alignment: Straightening of the normal cervical lordosis. No listhesis.  Vertebrae: No acute fracture, evidence of discitis, or  suspicious osseous lesion.  Cord: Redemonstrated subtle,  hazy T2 hyperintense signal in the central cord at the level of C4 (series 8, image 20) and in the left hemicord at C5 (series 8, images 20 5-26). No abnormal enhancement. No new T2 hyperintense signal. Otherwise normal in signal and morphology.  Posterior Fossa, vertebral arteries, paraspinal tissues: Negative.  Disc levels:  C2-C3: No significant disc bulge. No spinal canal stenosis or neuroforaminal narrowing.  C3-C4: Minimal disc bulge. Uncovertebral hypertrophy. Small central disc protrusion. No spinal canal stenosis. Mild bilateral neural foraminal narrowing.  C4-C5: Minimal disc bulge. Uncovertebral and right facet hypertrophy. Small central disc protrusion with annular fissure. No spinal canal stenosis or neural foraminal narrowing.  C5-C6: Mild disc bulge with right paracentral disc protrusion. Uncovertebral hypertrophy. Mild spinal canal stenosis and mild right neural foraminal narrowing, unchanged.  C6-C7: No significant disc bulge. No spinal canal stenosis or neuroforaminal narrowing.  C7-T1: No significant disc bulge. No spinal canal stenosis or neuroforaminal narrowing.  IMPRESSION: 1. Unchanged T2 hyperintense lesions in the supratentorial and infratentorial brain, consistent with the patient's history of multiple sclerosis. No new lesion or abnormal enhancement to suggest active demyelination. 2. Unchanged subtle T2 hyperintense foci in spinal cord, consistent with the patient's history of multiple sclerosis. No new lesion or abnormal enhancement to suggest active demyelination. 3. Unchanged mild degenerative changes in the cervical spine, with mild spinal canal stenosis and mild right neural foraminal narrowing at C5-C6, and mild bilateral neural foraminal narrowing at C3-C4.   Electronically Signed By: Wiliam Ke M.D. On: 05/05/2023 00:21   MR THORACIC SPINE WO  CONTRAST  Narrative CLINICAL DATA:  Initial evaluation for acute myelopathy.  EXAM: MRI THORACIC SPINE WITHOUT CONTRAST  TECHNIQUE: Multiplanar, multisequence MR imaging of the thoracic spine was performed. No intravenous contrast was administered.  COMPARISON:  Prior study from 05/28/2022.  FINDINGS: Alignment: Vertebral bodies normally aligned with preservation of the normal thoracic kyphosis. No listhesis.  Vertebrae: Vertebral body height maintained without acute or chronic fracture. Bone marrow signal intensity within normal limits. No discrete or worrisome osseous lesions or abnormal marrow edema.  Cord: Normal signal and morphology. No visible cord signal changes to suggest demyelinating disease.  Paraspinal and other soft tissues: Unremarkable.  Disc levels:  T6-7: Minimal disc bulge with superimposed tiny central disc protrusion. Minimal flattening of the ventral cord without cord sin changes or significant spinal stenosis. Foramina remain patent.  T7-8: Right paracentral disc protrusion indents the right ventral thecal sac. Secondary flattening of the right hemi cord without cord signal changes or significant spinal stenosis. Foramina remain patent.  T8-9: Minimal disc bulge. No spinal stenosis. Foramina remain patent.  T9-10: Negative interspace. Minimal facet hypertrophy. Note stenosis.  T10-11: Negative interspace.  Mild facet hypertrophy.  No stenosis.  Otherwise, no other significant disc pathology or facet degeneration seen elsewhere within the thoracic spine. No other canal or neural foraminal stenosis or evidence for neural impingement.  IMPRESSION: 1. Normal MRI appearance of the thoracic spinal cord. No cord signal changes to suggest demyelinating disease or other abnormality. 2. Small disc protrusions at T6-7 and T7-8 with minimal cord flattening, but no cord signal changes or significant spinal stenosis.   Electronically Signed By:  Rise Mu M.D. On: 12/02/2022 23:12   MR THORACIC SPINE W WO CONTRAST  Narrative CLINICAL DATA:  Demyelinating disease  EXAM: MRI HEAD WITHOUT AND WITH CONTRAST  MRI CERVICAL SPINE WITHOUT AND WITH CONTRAST  MRI THORACIC SPINE WITHOUT WITH AND CONTRAST  MRI LUMBAR  SPINE WITHOUT WITH AND CONTRAST  CONTRAST:  7.53mL GADAVIST GADOBUTROL 1 MMOL/ML IV SOLN  TECHNIQUE: Multiplanar, multiecho pulse sequences of the brain and surrounding structures, and cervical, thoracic, and lumbar spine were obtained without and with intravenous contrast.  COMPARISON:  MRI head and 03/29/2021. MRI cervical spine 04/09/2021.  FINDINGS: MRI HEAD FINDINGS  Brain: No acute infarction, hemorrhage, hydrocephalus, extra-axial collection or mass lesion. Mild scattered small scattered T2/FLAIR hyperintensities within the subcortical and periventricular white matter. No new lesions in comparison to the prior. No pathologic enhancement. Small benign dilated perivascular space versus remote lacunar infarct in the left basal ganglia.  Vascular: Major arterial flow voids are maintained at the skull base.  Skull and upper cervical spine: Normal marrow signal.  Sinuses/Orbits: Paranasal sinus mucosal thickening. No acute orbital findings.  Other: No mastoid effusions.  MRI CERVICAL SPINE FINDINGS  Alignment: Mild reversal of normal cervical lordosis. No substantial sagittal subluxation.  Vertebrae: No fracture, evidence of discitis, or bone lesion.  Cord: Similar subtle short-segment T2/STIR hyperintense cord lesion C4. No new cord lesion. No abnormal enhancement.  Posterior Fossa, vertebral arteries, paraspinal tissues: Visualized vertebral artery flow voids are maintained.  Disc levels: Similar mild multilevel degenerative change, greatest at C5-C6 where there is mild canal stenosis and mild right foraminal stenosis. Degenerative disc height loss and desiccation at  this level.  MRI THORACIC SPINE FINDINGS  Alignment: Mildly exaggerated upper thoracic kyphosis. No substantial sagittal subluxation.  Vertebrae: No fracture, evidence of discitis, or bone lesion.  Cord: No convincing cord signal abnormality. No evidence of abnormal cord enhancement.  Paraspinal and other soft tissues: Unremarkable.  Disc levels: Degenerative disc height loss at T6-T7. Right paracentral disc protrusion contacts and flattens the ventral cord at T7-T8. No significant canal or foraminal stenosis.  MRI THORACIC SPINE FINDINGS  Segmentation: Transitional anatomy when counting from the top. The S1 vertebral body is lumbarized.  Alignment: Normal.  Vertebrae: No fracture, evidence of discitis, or bone lesion.  Conus: Normal. No abnormal enhancement of the conus or the cauda equina.  Paraspinal and other soft tissues: Unremarkable.  Disc levels: Mild disc bulging at multiple levels without significant canal stenosis. Patent foramina without evidence of impingement.  IMPRESSION: MRI head:  1. No evidence of acute intracranial abnormality. 2. Mild scattered small scattered T2/FLAIR hyperintensities within the subcortical and periventricular white matter. These are nonspecific in appearance and could potentially represent the sequela of chronic microvascular ischemic disease and/or prior demyelination given the clinical history. No new lesions in comparison to the prior. No abnormal enhancement to suggest active demyelination.  MRI cervical spine:  1. Similar subtle short-segment T2/STIR hyperintense cord lesion C4, compatible with prior demyelination. No new cord lesion. No abnormal enhancement. 2. Similar mild multilevel degenerative change, greatest at C5-C6 where there is mild canal stenosis and mild right foraminal stenosis.  MRI thoracic spine:  1. No convincing cord signal abnormality.  No cord enhancement. 2. Right paracentral disc protrusion  contacts and flattens the ventral cord at T7-T8. No significant canal or foraminal stenosis.  MRI lumbar spine:  1. Unremarkable appearance of the conus and cauda equina. No abnormal enhancement. 2. Mild disc bulging without evidence of impingement.   Electronically Signed By: Feliberto Harts M.D. On: 05/28/2022 16:41  MR LUMBAR SPINE WO CONTRAST  Narrative CLINICAL DATA:  Initial evaluation for acute myelopathy.  EXAM: MRI LUMBAR SPINE WITHOUT CONTRAST  TECHNIQUE: Multiplanar, multisequence MR imaging of the lumbar spine was performed. No intravenous contrast was administered.  COMPARISON:  Prior study from 05/28/2022.  FINDINGS: Segmentation: Standard. Lowest well-formed disc space labeled the L5-S1 level.  Alignment: Physiologic with preservation of the normal lumbar lordosis. No listhesis.  Vertebrae: Vertebral body height maintained without acute or chronic fracture. Bone marrow signal intensity within normal limits. No discrete or worrisome osseous lesions. No abnormal marrow edema.  Conus medullaris and cauda equina: Conus extends to the T12 level. Conus and cauda equina appear normal.  Paraspinal and other soft tissues: Paraspinous soft tissues within normal limits. Tiny 4 mm T2 hyperintense cyst partially visualize within the left kidney, benign in appearance, no follow-up imaging recommended.  Disc levels:  L1-2:  Unremarkable.  L2-3:  Unremarkable.  L3-4: Normal interspace. Mild facet hypertrophy. No spinal stenosis. Foramina remain patent.  L4-5: Mild disc bulge. Mild facet hypertrophy. No spinal stenosis. Foramina remain patent.  L5-S1: Negative interspace. Mild facet hypertrophy. No canal or foraminal stenosis.  IMPRESSION: 1. Normal MRI appearance of the conus and cauda equina. No findings to explain patient's symptoms. 2. Minor noncompressive disc bulging at L4-5 without stenosis or neural impingement. 3. Mild lower lumbar facet  hypertrophy without stenosis.   Electronically Signed By: Rise Mu M.D. On: 12/02/2022 23:17   MR Lumbar Spine W Wo Contrast  Narrative CLINICAL DATA:  Demyelinating disease  EXAM: MRI HEAD WITHOUT AND WITH CONTRAST  MRI CERVICAL SPINE WITHOUT AND WITH CONTRAST  MRI THORACIC SPINE WITHOUT WITH AND CONTRAST  MRI LUMBAR  SPINE WITHOUT WITH AND CONTRAST  CONTRAST:  7.12mL GADAVIST GADOBUTROL 1 MMOL/ML IV SOLN  TECHNIQUE: Multiplanar, multiecho pulse sequences of the brain and surrounding structures, and cervical, thoracic, and lumbar spine were obtained without and with intravenous contrast.  COMPARISON:  MRI head and 03/29/2021. MRI cervical spine 04/09/2021.  FINDINGS: MRI HEAD FINDINGS  Brain: No acute infarction, hemorrhage, hydrocephalus, extra-axial collection or mass lesion. Mild scattered small scattered T2/FLAIR hyperintensities within the subcortical and periventricular white matter. No new lesions in comparison to the prior. No pathologic enhancement. Small benign dilated perivascular space versus remote lacunar infarct in the left basal ganglia.  Vascular: Major arterial flow voids are maintained at the skull base.  Skull and upper cervical spine: Normal marrow signal.  Sinuses/Orbits: Paranasal sinus mucosal thickening. No acute orbital findings.  Other: No mastoid effusions.  MRI CERVICAL SPINE FINDINGS  Alignment: Mild reversal of normal cervical lordosis. No substantial sagittal subluxation.  Vertebrae: No fracture, evidence of discitis, or bone lesion.  Cord: Similar subtle short-segment T2/STIR hyperintense cord lesion C4. No new cord lesion. No abnormal enhancement.  Posterior Fossa, vertebral arteries, paraspinal tissues: Visualized vertebral artery flow voids are maintained.  Disc levels: Similar mild multilevel degenerative change, greatest at C5-C6 where there is mild canal stenosis and mild right foraminal stenosis.  Degenerative disc height loss and desiccation at this level.  MRI THORACIC SPINE FINDINGS  Alignment: Mildly exaggerated upper thoracic kyphosis. No substantial sagittal subluxation.  Vertebrae: No fracture, evidence of discitis, or bone lesion.  Cord: No convincing cord signal abnormality. No evidence of abnormal cord enhancement.  Paraspinal and other soft tissues: Unremarkable.  Disc levels: Degenerative disc height loss at T6-T7. Right paracentral disc protrusion contacts and flattens the ventral cord at T7-T8. No significant canal or foraminal stenosis.  MRI THORACIC SPINE FINDINGS  Segmentation: Transitional anatomy when counting from the top. The S1 vertebral body is lumbarized.  Alignment: Normal.  Vertebrae: No fracture, evidence of discitis, or bone lesion.  Conus: Normal. No abnormal enhancement of the conus or the cauda equina.  Paraspinal and other soft  tissues: Unremarkable.  Disc levels: Mild disc bulging at multiple levels without significant canal stenosis. Patent foramina without evidence of impingement.  IMPRESSION: MRI head:  1. No evidence of acute intracranial abnormality. 2. Mild scattered small scattered T2/FLAIR hyperintensities within the subcortical and periventricular white matter. These are nonspecific in appearance and could potentially represent the sequela of chronic microvascular ischemic disease and/or prior demyelination given the clinical history. No new lesions in comparison to the prior. No abnormal enhancement to suggest active demyelination.  MRI cervical spine:  1. Similar subtle short-segment T2/STIR hyperintense cord lesion C4, compatible with prior demyelination. No new cord lesion. No abnormal enhancement. 2. Similar mild multilevel degenerative change, greatest at C5-C6 where there is mild canal stenosis and mild right foraminal stenosis.  MRI thoracic spine:  1. No convincing cord signal abnormality.  No cord  enhancement. 2. Right paracentral disc protrusion contacts and flattens the ventral cord at T7-T8. No significant canal or foraminal stenosis.  MRI lumbar spine:  1. Unremarkable appearance of the conus and cauda equina. No abnormal enhancement. 2. Mild disc bulging without evidence of impingement.   Electronically Signed By: Feliberto Harts M.D. On: 05/28/2022 16:41  DG Foot Complete Left  Narrative CLINICAL DATA:  Fall, injury to great toe and 2nd toe.  EXAM: LEFT FOOT - COMPLETE 3+ VIEW  COMPARISON:  None Available.  FINDINGS: No acute bony abnormality. Specifically, no fracture, subluxation, or dislocation. Joint spaces maintained. Soft tissues intact. Postoperative changes visualized in the distal fibula.  IMPRESSION: No acute bony abnormality.   Electronically Signed By: Charlett Nose M.D. On: 01/26/2022 17:25   Elbow Imaging: Elbow-R DG Complete: No results found for this or any previous visit.  Elbow-L DG Complete: No results found for this or any previous visit.   Wrist Imaging: Wrist-R DG Complete: No results found for this or any previous visit.  Wrist-L DG Complete: Results for orders placed during the hospital encounter of 03/30/23  DG Wrist Complete Left  Narrative CLINICAL DATA:  Hit against metal object 10 days ago. Pain with movement radiating to the elbow and hand.  EXAM: LEFT WRIST - COMPLETE 3+ VIEW  COMPARISON:  None Available.  FINDINGS: There is no acute fracture or dislocation. Bony alignment is normal. The joint spaces are preserved. There is no erosive change. The soft tissues are unremarkable. There is no radiopaque foreign body or soft tissue gas.  IMPRESSION: Normal wrist radiographs.   Electronically Signed By: Lesia Hausen M.D. On: 03/30/2023 21:32    Complexity Note: Imaging results reviewed.                         ROS  Cardiovascular: No reported cardiovascular signs or symptoms such as High blood  pressure, coronary artery disease, abnormal heart rate or rhythm, heart attack, blood thinner therapy or heart weakness and/or failure Pulmonary or Respiratory: No reported pulmonary signs or symptoms such as wheezing and difficulty taking a deep full breath (Asthma), difficulty blowing air out (Emphysema), coughing up mucus (Bronchitis), persistent dry cough, or temporary stoppage of breathing during sleep Neurological: Incontinence:  Urinary Psychological-Psychiatric: Anxiousness and Prone to panicking Gastrointestinal: No reported gastrointestinal signs or symptoms such as vomiting or evacuating blood, reflux, heartburn, alternating episodes of diarrhea and constipation, inflamed or scarred liver, or pancreas or irrregular and/or infrequent bowel movements Genitourinary: Passing kidney stones Hematological: No reported hematological signs or symptoms such as prolonged bleeding, low or poor functioning platelets, bruising or bleeding easily,  hereditary bleeding problems, low energy levels due to low hemoglobin or being anemic Endocrine: No reported endocrine signs or symptoms such as high or low blood sugar, rapid heart rate due to high thyroid levels, obesity or weight gain due to slow thyroid or thyroid disease Rheumatologic: No reported rheumatological signs and symptoms such as fatigue, joint pain, tenderness, swelling, redness, heat, stiffness, decreased range of motion, with or without associated rash Musculoskeletal: Multiple sclerosis Work History: Out on work excuse  Allergies  Mr. Vanrossum is allergic to clavulanic acid, dilaudid [hydromorphone], and oxycodone.  Laboratory Chemistry Profile   Renal Lab Results  Component Value Date   BUN 12 06/01/2023   CREATININE 0.77 06/01/2023   GFRNONAA >60 06/01/2023   SPECGRAV 1.010 02/07/2023   PHUR 7.0 02/07/2023   PROTEINUR NEGATIVE 06/01/2023     Electrolytes Lab Results  Component Value Date   NA 137 06/01/2023   K 4.7  06/01/2023   CL 103 06/01/2023   CALCIUM 9.8 06/01/2023   MG 1.7 05/08/2023     Hepatic Lab Results  Component Value Date   AST 18 01/29/2023   ALT 16 01/29/2023   ALBUMIN 4.8 01/29/2023   ALKPHOS 45 01/29/2023   LIPASE 34 01/20/2022     ID Lab Results  Component Value Date   HIV Non Reactive 05/05/2023   SARSCOV2NAA NEGATIVE 10/27/2021     Bone No results found for: "VD25OH", "VD125OH2TOT", "ZO1096EA5", "WU9811BJ4", "25OHVITD1", "25OHVITD2", "25OHVITD3", "TESTOFREE", "TESTOSTERONE"   Endocrine Lab Results  Component Value Date   GLUCOSE 114 (H) 06/01/2023   GLUCOSEU NEGATIVE 06/01/2023     Neuropathy Lab Results  Component Value Date   HIV Non Reactive 05/05/2023     CNS No results found for: "COLORCSF", "APPEARCSF", "RBCCOUNTCSF", "WBCCSF", "POLYSCSF", "LYMPHSCSF", "EOSCSF", "PROTEINCSF", "GLUCCSF", "JCVIRUS", "CSFOLI", "IGGCSF", "LABACHR", "ACETBL"   Inflammation (CRP: Acute  ESR: Chronic) Lab Results  Component Value Date   LATICACIDVEN 0.7 11/15/2021     Rheumatology No results found for: "RF", "ANA", "LABURIC", "URICUR", "LYMEIGGIGMAB", "LYMEABIGMQN", "HLAB27"   Coagulation Lab Results  Component Value Date   PLT 261 06/01/2023     Cardiovascular Lab Results  Component Value Date   CKTOTAL 805 (H) 10/09/2022   HGB 14.3 06/01/2023   HCT 42.4 06/01/2023     Screening Lab Results  Component Value Date   SARSCOV2NAA NEGATIVE 10/27/2021   HIV Non Reactive 05/05/2023     Cancer No results found for: "CEA", "CA125", "LABCA2"   Allergens No results found for: "ALMOND", "APPLE", "ASPARAGUS", "AVOCADO", "BANANA", "BARLEY", "BASIL", "BAYLEAF", "GREENBEAN", "LIMABEAN", "WHITEBEAN", "BEEFIGE", "REDBEET", "BLUEBERRY", "BROCCOLI", "CABBAGE", "MELON", "CARROT", "CASEIN", "CASHEWNUT", "CAULIFLOWER", "CELERY"     Note: Lab results reviewed.  PFSH  Drug: Mr. Verhagen  reports that he does not currently use drugs. Alcohol:  reports that he does not  currently use alcohol. Tobacco:  reports that he has been smoking cigarettes. He has never been exposed to tobacco smoke. He has quit using smokeless tobacco.  His smokeless tobacco use included snuff. Medical:  has a past medical history of Kidney stones and MS (multiple sclerosis) (HCC). Family: family history is not on file.  Past Surgical History:  Procedure Laterality Date   CYSTOSCOPY W/ RETROGRADES  11/18/2021   Procedure: CYSTOSCOPY WITH RETROGRADE PYELOGRAM;  Surgeon: Sondra Come, MD;  Location: [facility];  Service: Urology;;   CYSTOSCOPY/URETEROSCOPY/HOLMIUM LASER/STENT PLACEMENT Left 11/18/2021   Procedure: CYSTOSCOPY/URETEROSCOPY/HOLMIUM LASER/STENT PLACEMENT;  Surgeon: Sondra Come, MD;  Location: [facility];  Service: Urology;  Laterality: Left;  FRACTURE SURGERY Left    Active Ambulatory Problems    Diagnosis Date Noted   Hydronephrosis with urinary obstruction due to ureteral calculus 11/15/2021   Pyelonephritis of left kidney 11/15/2021   Multiple sclerosis (HCC) 12/29/2018   Acute pyelonephritis 11/15/2021   Acute upper respiratory infection 03/01/2015   ADHD (attention deficit hyperactivity disorder), combined type 12/22/2015   Anxiety 05/31/2018   Continuous cannabis use 12/22/2015   Elevated blood pressure reading 05/27/2020   Heroin abuse (HCC) 12/10/2020   History of hepatitis C 10/08/2019   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   Need for diphtheria-tetanus-pertussis (Tdap) vaccine 03/01/2015   Open wound of left hand 03/01/2015   Spasticity 01/01/2019   Urinary incontinence 11/08/2019   Acute pharyngitis 03/01/2015   MS (multiple sclerosis) (HCC) 05/04/2023   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 In no apparent acute distress Vitals:   06/19/23 0808  BP: 139/80  Pulse: 86  Temp: 98.6 F (37 C)  SpO2: 100%  Weight: 185 lb (83.9 kg)  Height: 5\' 9"  (1.753 m)   BMI Assessment: Estimated body mass index is 27.32 kg/m as calculated from the following:   Height as of this encounter: 5\' 9"  (1.753 m).   Weight as of this encounter: 185 lb (83.9 kg).  BMI interpretation table: BMI level Category Range association with higher incidence of chronic pain  <18 kg/m2 Underweight   18.5-24.9 kg/m2 Ideal body weight   25-29.9 kg/m2 Overweight Increased incidence by 20%  30-34.9 kg/m2 Obese (Class I) Increased incidence by 68%  35-39.9 kg/m2 Severe obesity (Class II) Increased incidence by 136%  >40 kg/m2 Extreme obesity (Class III) Increased incidence by 254%   Patient's current BMI Ideal Body weight  Body mass index is 27.32 kg/m. Ideal body weight: 70.7 kg (155 lb 13.8 oz) Adjusted ideal body weight: 76 kg (167 lb 8.3 oz)   BMI Readings from Last 4 Encounters:  06/19/23 27.32 kg/m  06/01/23 26.05 kg/m  05/23/23 26.29 kg/m  05/19/23 27.32 kg/m   Wt Readings from Last 4 Encounters:  06/19/23 185 lb (83.9 kg)  06/01/23 176 lb 5.9 oz (80 kg)  05/23/23 178 lb (80.7 kg)  05/19/23 185 lb (83.9 kg)    Psych/Mental status: Alert, oriented x 3 (person, place, & time)       Eyes: PERLA Respiratory: No evidence of acute respiratory distress  Cervical Spine Area Exam  Skin & Axial Inspection: No masses, redness, edema, swelling, or associated skin lesions Alignment: Symmetrical Functional ROM: Unrestricted ROM      Stability: No instability detected Muscle Tone/Strength: Functionally intact. No obvious neuro-muscular anomalies detected. Sensory (Neurological): Neurogenic pain pattern Palpation: No  palpable anomalies             Upper Extremity (UE) Exam    Side: Right upper extremity  Side: Left upper extremity  Skin & Extremity Inspection: Skin color, temperature, and hair growth are WNL. No peripheral edema or cyanosis. No masses, redness, swelling, asymmetry, or associated skin lesions. No contractures.  Skin & Extremity Inspection: Skin color, temperature, and hair growth are WNL.  No peripheral edema or cyanosis. No masses, redness, swelling, asymmetry, or associated skin lesions. No contractures.  Functional ROM: Unrestricted ROM          Functional ROM: Unrestricted ROM          Muscle Tone/Strength: Functionally intact. No obvious neuro-muscular anomalies detected.  Muscle Tone/Strength: Functionally intact. No obvious neuro-muscular anomalies detected.  Sensory (Neurological): Neurogenic pain pattern          Sensory (Neurological): Neurogenic pain pattern          Palpation: No palpable anomalies              Palpation: No palpable anomalies              Provocative Test(s):  Phalen's test: deferred Tinel's test: deferred Apley's scratch test (touch opposite shoulder):  Action 1 (Across chest): deferred Action 2 (Overhead): deferred Action 3 (LB reach): deferred   Provocative Test(s):  Phalen's test: deferred Tinel's test: deferred Apley's scratch test (touch opposite shoulder):  Action 1 (Across chest): deferred Action 2 (Overhead): deferred Action 3 (LB reach): deferred    Thoracic Spine Area Exam  Skin & Axial Inspection: No masses, redness, or swelling Alignment: Symmetrical Functional ROM: Unrestricted ROM Stability: No instability detected Muscle Tone/Strength: Functionally intact. No obvious neuro-muscular anomalies detected. Sensory (Neurological): Unimpaired Muscle strength & Tone: No palpable anomalies Lumbar Spine Area Exam  Skin & Axial Inspection: No masses, redness, or swelling Alignment: Symmetrical Functional ROM: Pain restricted ROM        Stability: No instability detected Muscle Tone/Strength: Functionally intact. No obvious neuro-muscular anomalies detected. Sensory (Neurological): Neurogenic pain pattern Palpation: No palpable anomalies       Provocative Tests: Hyperextension/rotation test: (+) due to pain. Lumbar quadrant test (Kemp's test): (+) on the left for foraminal stenosis  Gait & Posture Assessment  Ambulation: Unassisted Gait: Relatively normal for age and body habitus Posture: WNL  Lower Extremity Exam    Side: Right lower extremity  Side: Left lower extremity  Stability: No instability observed          Stability: No instability observed          Skin & Extremity Inspection: Skin color, temperature, and hair growth are WNL. No peripheral edema or cyanosis. No masses, redness, swelling, asymmetry, or associated skin lesions. No contractures.  Skin & Extremity Inspection: Skin color, temperature, and hair growth are WNL. No peripheral edema or cyanosis. No masses, redness, swelling, asymmetry, or associated skin lesions. No contractures.  Functional ROM: Unrestricted ROM                  Functional ROM: Unrestricted ROM                  Muscle Tone/Strength: Functionally intact. No obvious neuro-muscular anomalies detected.  Muscle Tone/Strength: Functionally intact. No obvious neuro-muscular anomalies detected.  Sensory (Neurological): Unimpaired        Sensory (Neurological): Unimpaired        DTR: Patellar: deferred today Achilles: deferred today Plantar: deferred today  DTR: Patellar: deferred today Achilles: deferred today Plantar: deferred today  Palpation: No palpable anomalies  Palpation: No palpable anomalies    Assessment  Primary Diagnosis & Pertinent Problem List: The primary encounter diagnosis was Cervical radicular pain. Diagnoses of Protrusion of cervical intervertebral disc, Multiple sclerosis (HCC), Anxiety, ADHD (attention deficit hyperactivity disorder), combined type, and Cervical  stenosis of spine were also pertinent to this visit.  Visit Diagnosis (New problems to examiner): 1. Cervical radicular  pain   2. Protrusion of cervical intervertebral disc   3. Multiple sclerosis (HCC)   4. Anxiety   5. ADHD (attention deficit hyperactivity disorder), combined type   6. Cervical stenosis of spine    Plan of Care (Initial workup plan)      Cervical Radiculopathy   He exhibits chronic neck pain radiating to both shoulders, with intermittent sharp pain exacerbated by neck movement and forward extension, causing an electrical shock sensation down the spine. MRI findings include T2 hyperintense foci at C4-C5, diffuse cervical spondylosis with mild bilateral foraminal stenosis at C3-C4, and mild central stenosis at C5-C6 with mild right foraminal stenosis. A previous cervical epidural injection provided significant relief. We discussed a C-ESI @ C7-T1 with 3 cc of steroid solution to reduce inflammation and explained the risks (infection, bleeding, temporary pain increase) and benefits (significant pain relief, reduced inflammation). He prefers this approach due to a previous positive outcome and clinic proximity. We will schedule a cervical epidural injection under fluoroscopy at C7-T1 with 3 cc of steroid solution, administer Valium for anxiety during the procedure, ensure he has a driver for the procedure, advise him to avoid eating before the procedure, and schedule the procedure for July 09, 2023.  Lumbar Radiculopathy   He has chronic lower back pain radiating to the left buttock and lateral left leg. We will discuss further management options for lumbar radiculopathy after addressing cervical issues.  Multiple Sclerosis   Diagnosed in July 2020, he was recently hospitalized for an MS exacerbation with new eye-related symptoms and a parietal lobe lesion causing cognitive recognition issues. Symptoms include intermittent electrical shock sensation down the spine, fluctuating  grip strength, and muscle movements, significantly impacting daily activities and quality of life. We will continue monitoring MS symptoms and manage exacerbations as needed.  Follow-up   We will schedule a follow-up appointment after the cervical epidural injection to assess efficacy and discuss further management of lumbar radiculopathy.        Procedure Orders         Cervical Epidural Injection      Provider-requested follow-up: Return in about 20 days (around 07/09/2023) for C-ESI, in clinic (PO Valium 5mg ).  Future Appointments  Date Time Provider Department Center  07/05/2023  9:00 AM Sater, Pearletha Furl, MD [facility] None  07/12/2023  2:00 PM Dana Allan, MD [id number]  07/20/2023  8:00 AM Drake Leach, PA-C [facility] None    Duration of encounter: .  Total time on encounter, as per AMA guidelines included both the face-to-face and non-face-to-face time personally spent by the physician and/or other qualified health care professional(s) on the day of the encounter (includes time in activities that require the physician or other qualified health care professional and does not include time in activities normally performed by clinical staff). Physician's time may include the following activities when performed: Preparing to see the patient (e.g., pre-charting review of records, searching for previously ordered imaging, lab work, and nerve conduction tests) Review of prior analgesic pharmacotherapies. Reviewing PMP Interpreting ordered tests (e.g., lab work, imaging, nerve conduction tests) Performing post-procedure evaluations, including interpretation of diagnostic procedures Obtaining and/or reviewing separately obtained history Performing a medically appropriate examination and/or evaluation Counseling and educating the patient/family/caregiver Ordering medications, tests, or procedures Referring and communicating with other health care professionals (when not separately  reported) Documenting clinical information in the electronic or other health record Independently interpreting results (not separately reported) and communicating results to the patient/ family/caregiver Care coordination (not separately reported)  Note by: Edward Jolly, MD (AI and TTS technology used. I apologize for any typographical errors that were not detected and corrected.) Date: 06/19/2023; Time: 8:54 AM

## 2023-06-19 NOTE — Progress Notes (Signed)
Safety precautions to be maintained throughout the outpatient stay will include: orient to surroundings, keep bed in low position, maintain call bell within reach at all times, provide assistance with transfer out of bed and ambulation.  

## 2023-06-19 NOTE — Patient Instructions (Signed)
  ______________________________________________________________________    Procedure instructions  Stop blood-thinners  Do not eat or drink fluids (other than water) for 6 hours before your procedure  No water for 2 hours before your procedure  Take your blood pressure medicine with a sip of water  Arrive 30 minutes before your appointment  If sedation is planned, bring suitable driver. Pennie Banter, Benedetto Goad, & public transportation are NOT APPROVED)  Carefully read the "Preparing for your procedure" detailed instructions  If you have questions call us at [phone]  ______________________________________________________________________

## 2023-06-20 ENCOUNTER — Other Ambulatory Visit: Payer: Self-pay | Admitting: Medical Genetics

## 2023-06-22 ENCOUNTER — Telehealth: Payer: Self-pay

## 2023-06-26 ENCOUNTER — Ambulatory Visit: Payer: Managed Care, Other (non HMO) | Admitting: Neurology

## 2023-06-29 LAB — GENECONNECT MOLECULAR SCREEN: Genetic Analysis Overall Interpretation: NEGATIVE

## 2023-07-03 ENCOUNTER — Telehealth: Payer: Self-pay | Admitting: Neurology

## 2023-07-03 NOTE — Telephone Encounter (Signed)
Pt called wanting to inform provider that he will be bringing his scan's to his appt on the 19th.

## 2023-07-03 NOTE — Telephone Encounter (Signed)
Message noted.

## 2023-07-04 NOTE — Progress Notes (Unsigned)
[facility]  PATIENT: Damon Blair DOB: 16-Aug-1990  REFERRING DOCTOR OR PCP:  Delfino Lovett, MD; Dana Allan, MD SOURCE: patient, notes from Dr. Elwyn Reach ([facility]), imaging and lab results.  MRI images personally reviewed.  _________________________________   HISTORICAL  CHIEF COMPLAINT:  Chief Complaint  Patient presents with   Room 11    Pt is here Alone. Pt states that he has blurry vision with his Left eye. Pt states that he has left eye pain. Pt states that he has light sensitivity. Pt states that he has stiffness in his in the back of his neck, more so on his left side. Pt states that he has numbness in his hands and feet. Pt states that he has fatigue. Pt states he has heaviness in his legs.     HISTORY OF PRESENT ILLNESS:  I had the pleasure of seeing your patient, Damon Blair, at the MS center at [facility] for consultation regarding his multiple sclerosis.  He is a 32 year old man who was diagnosed with MS in July 2020 after presenting with nystagmus and paresthesias in the limbs. He was initially placed on Copaxone 40 mg three times per week.   He had a relapse in 2021 with left sided facial weakness and partial left body numbness.  He changed his diet to the the [facility] protocol.     He did well in 2022 and 2023.  More recently, he had the onset of left eye pain, blurry vision and light sensitivity.   He has seen ophthalmology and got new lenses but vision changed and he needed new lenses.   On recheck, vision had changed again.  However, he was apparently correctable to 20/20.  He continued to have eye pain and had an MRI of the orbits.   The orbits were fine but he had a small enhancing right parietal lesion.  Most likely would have been asymptomatic.  He had several courses of IV Solu-medrol due to the concern about optic neuritis.    This caused him to have some 'manic' like symptoms and poor sleep.   Currently, he notes that  he has reduced balance affecting his gait.Marland Kitchen   He uses the bannister on stairs.   He can walk long distances and keeps up with others on long walks.   His left leg is a little weaker than the right.  He also has left leg spasticity and takes baclofen with benefit.  He has numbness in his fingertips bilaterally.  Sometimes has a Lhermitte sign.   Handwriting is sloppy and he notes mild reduced coordination - especially with his non-dominant left hand.   Voice is slightly slurred.    His vision is much better now, near normal.  Left eye pain is better but not resolved.     He has urinary urgency and occasional urge incontinence dribbling.   He was once on oxybutynin with some benefit.    He notes cognitive and mood changes.   He sees psychiatry.  Amitriptyline and buspirone were recommended.   He had been on amitriptyline for dysesthesias in the past for dysesthesias.   He feels depressed and is more irritable.  He has ADD and has been on Adderall with some benefit in fatigue and completing tasks.     He is on dronabinol (was on medical MJ in [place]) for MS spasticity and pain.    He is sleeping better.  He has neck pain and has had a benefit from a cervical  ESI.   He has concerns about stronger MS medication due to h/o Hep C and several viral STD.  He has few outbreak now.   He has Hep C and was successfully treated.  Recent labs show Ab to Hep C but is RNA negative.     Imaging: MRI of the orbits 06/06/2023 (report summary) showed a similar distribution of white matter changes consistent with MS.  There was a new enhancing 2 mm lesion in the right parietal lobe.  The orbits had an unremarkable appearance.  Chronic sinusitis.  MRI of the brain 05/04/2023 shows T2/FLAIR hyperintense foci in the left anterior medulla, pons, left middle cerebellar peduncle, and in the periventricular, juxtacortical deep white matter of the cerebral hemispheres.  None of the foci enhanced.  1 focus in the basal  ganglia/near the genu of the left internal capsule has an appearance more typical of a chronic lacunar infarction.  MRI of the cervical spine 05/05/2023 showed T2 hyperintense foci at the cervicomedullary junction, anteriorly adjacent to C1, anterolaterally to the right adjacent to C2, centrally adjacent to C3-C4, anteriorly to the left adjacent to C5,  MRI of the thoracic spine 12/02/2022 showed a normal spinal cord.  There are small disc protrusions at T6-T7 and T7-T8 but no spinal stenosis or nerve root compression.   Laboratory test: 06/12/2023: Hepatitis B surface antibody positive, surface antigen negative, core antibody negative, TB negative Hep C antibody was reactive but the PCR was negative 05/22/2023: ESR and CRP were normal  REVIEW OF SYSTEMS: Constitutional: No fevers, chills, sweats, or change in appetite Eyes: See above Ear, nose and throat: No hearing loss, ear pain, nasal congestion, sore throat Cardiovascular: No chest pain, palpitations Respiratory:  No shortness of breath at rest or with exertion.   No wheezes GastrointestinaI: No nausea, vomiting, diarrhea, abdominal pain, fecal incontinence Genitourinary:  No dysuria, urinary retention or frequency.  No nocturia. Musculoskeletal:  No neck pain, back pain Integumentary: No rash, pruritus, skin lesions Neurological: as above Psychiatric: Notes anxiety and depression  endocrine: No palpitations, diaphoresis, change in appetite, change in weigh or increased thirst Hematologic/Lymphatic:  No anemia, purpura, petechiae. Allergic/Immunologic: No itchy/runny eyes, nasal congestion, recent allergic reactions, rashes  ALLERGIES: Allergies  Allergen Reactions   Clavulanic Acid    Dilaudid [Hydromorphone]    Oxycodone Itching    HOME MEDICATIONS:  Current Outpatient Medications:    acetaminophen (TYLENOL) 325 MG tablet, Take 325 mg by mouth every 6 (six) hours as needed., Disp: , Rfl:    amphetamine-dextroamphetamine  (ADDERALL) 10 MG tablet, Take 10 mg by mouth daily., Disp: , Rfl:    baclofen (LIORESAL) 20 MG tablet, Take 20 mg by mouth 3 (three) times daily. 40 am 20 pm, Disp: , Rfl:    cetirizine (ZYRTEC) 10 MG tablet, Take 10 mg by mouth as needed., Disp: , Rfl:    Cholecalciferol (VITAMIN D-3) 25 MCG (1000 UT) CAPS, Take by mouth., Disp: , Rfl:    cyanocobalamin (VITAMIN B12) 1000 MCG/ML injection, Inject 1,000 mcg into the muscle every 30 (thirty) days., Disp: , Rfl:    diazepam (VALIUM) 5 MG tablet, Take 5 mg by mouth as needed for anxiety., Disp: , Rfl:    dronabinol (MARINOL) 5 MG capsule, Take 10 mg by mouth daily., Disp: , Rfl:    gabapentin (NEURONTIN) 800 MG tablet, Take 1 tablet (800 mg total) by mouth 3 (three) times daily., Disp: 60 tablet, Rfl: 0   Glatiramer Acetate (GLATOPA) 40 MG/ML SOSY, One ml  subcu three times a week., Disp: 36 mL, Rfl: 4   indomethacin (INDOCIN) 25 MG capsule, Take 1 capsule (25 mg total) by mouth 3 (three) times daily with meals as needed., Disp: 30 capsule, Rfl: 1   naproxen (NAPROSYN) 375 MG tablet, Take 1 tablet (375 mg total) by mouth 2 (two) times daily., Disp: 20 tablet, Rfl: 0   Omega-3 Fatty Acids (FISH OIL) 500 MG CAPS, Take by mouth., Disp: , Rfl:    rosuvastatin (CRESTOR) 5 MG tablet, Take 5 mg by mouth daily., Disp: , Rfl:   PAST MEDICAL HISTORY: Past Medical History:  Diagnosis Date   Kidney stones    MS (multiple sclerosis) (HCC)     PAST SURGICAL HISTORY: Past Surgical History:  Procedure Laterality Date   CYSTOSCOPY W/ RETROGRADES  11/18/2021   Procedure: CYSTOSCOPY WITH RETROGRADE PYELOGRAM;  Surgeon: Sondra Come, MD;  Location: [facility] ORS;  Service: Urology;;   CYSTOSCOPY/URETEROSCOPY/HOLMIUM LASER/STENT PLACEMENT Left 11/18/2021   Procedure: CYSTOSCOPY/URETEROSCOPY/HOLMIUM LASER/STENT PLACEMENT;  Surgeon: Sondra Come, MD;  Location: [facility] ORS;  Service: Urology;  Laterality: Left;   FRACTURE SURGERY Left     FAMILY HISTORY: History  reviewed. No pertinent family history.  SOCIAL HISTORY: Social History   Socioeconomic History   Marital status: Married    Spouse name: Not on file   Number of children: Not on file   Years of education: Not on file   Highest education level: Not on file  Occupational History   Not on file  Tobacco Use   Smoking status: Some Days    Types: Cigarettes    Passive exposure: Never   Smokeless tobacco: Former    Types: Snuff  Vaping Use   Vaping status: Former  Substance and Sexual Activity   Alcohol use: Not Currently   Drug use: Not Currently   Sexual activity: Yes  Other Topics Concern   Not on file  Social History Narrative   Not on file   Social Drivers of Health   Financial Resource Strain: High Risk (12/04/2022)   [facility]   Overall Financial Resource Strain (CARDIA)    Difficulty of Paying Living Expenses: Very hard  Food Insecurity: No Food Insecurity (05/04/2023)   Hunger Vital Sign    Worried About Running Out of Food in the Last Year: Never true    Ran Out of Food in the Last Year: Never true  Transportation Needs: No Transportation Needs (05/04/2023)   PRAPARE - Administrator, Civil Service (Medical): No    Lack of Transportation (Non-Medical): No  Physical Activity: Not on file  Stress: Not on file  Social Connections: Unknown (11/15/2021)   Received from [facility]   Social Network    Social Network: Not on file  Intimate Partner Violence: Not At Risk (05/04/2023)   Humiliation, Afraid, Rape, and Kick questionnaire    Fear of Current or Ex-Partner: No    Emotionally Abused: No    Physically Abused: No    Sexually Abused: No       PHYSICAL EXAM  Vitals:   07/05/23 0909  BP: 129/77  Pulse: 88  Weight: 178 lb 8 oz (81 kg)  Height: 5\' 9"  (1.753 m)    Body mass index is 26.36 kg/m.  Vision Screening   Right eye Left eye Both eyes  Without correction 20/30 20/30 20/20   With  correction        General: The patient is well-developed and  well-nourished and in no acute distress  HEENT:  Head is [place]/AT.  Sclera are anicteric.  Funduscopic exam shows normal optic discs and retinal vessels.  Neck: No carotid bruits are noted.  The neck is nontender.  Cardiovascular: The heart has a regular rate and rhythm with a normal S1 and S2. There were no murmurs, gallops or rubs.    Skin: Extremities are without rash or  edema.  Musculoskeletal:  Back is nontender  Neurologic Exam  Mental status: The patient is alert and oriented x 3 at the time of the examination. The patient has apparent normal recent and remote memory, with an apparently normal attention span and concentration ability.   Speech is normal.  Cranial nerves: Extraocular movements are full. Pupils are equal, round, and reactive to light and accomodation.  Visual fields are full.  Facial symmetry is present. There is good facial sensation to soft touch bilaterally.Facial strength is normal.  Trapezius and sternocleidomastoid strength is normal. No dysarthria is noted.  The tongue is midline, and the patient has symmetric elevation of the soft palate. No obvious hearing deficits are noted.  Motor:  Muscle bulk is normal.   Tone is normal. Strength is  5 / 5 in all 4 extremities.   Sensory: Sensory testing is intact to pinprick, soft touch and vibration sensation in arms but has reduced left leg sensation to touch/temp and vibration.   Coordination: Cerebellar testing reveals good finger-nose-finger and heel-to-shin bilaterally.  Gait and station: Station is normal.   Gait is normal. Tandem gait is minimally wide. Romberg is negative.   Reflexes: Deep tendon reflexes are symmetric and normal bilaterally.   Plantar responses are flexor.    DIAGNOSTIC DATA (LABS, IMAGING, TESTING) - I reviewed patient records, labs, notes, testing and imaging myself where available.  Lab Results  Component Value Date   WBC  6.2 06/01/2023   HGB 14.3 06/01/2023   HCT 42.4 06/01/2023   MCV 88.0 06/01/2023   PLT 261 06/01/2023      Component Value Date/Time   NA 137 06/01/2023 1004   K 4.7 06/01/2023 1004   CL 103 06/01/2023 1004   CO2 22 06/01/2023 1004   GLUCOSE 114 (H) 06/01/2023 1004   BUN 12 06/01/2023 1004   CREATININE 0.77 06/01/2023 1004   CALCIUM 9.8 06/01/2023 1004   PROT 7.8 01/29/2023 1510   ALBUMIN 4.8 01/29/2023 1510   AST 18 01/29/2023 1510   ALT 16 01/29/2023 1510   ALKPHOS 45 01/29/2023 1510   BILITOT 0.8 01/29/2023 1510   GFRNONAA >60 06/01/2023 1004        ASSESSMENT AND PLAN  MS (multiple sclerosis) (HCC)  History of hepatitis C  Anxiety  ADHD (attention deficit hyperactivity disorder), combined type  Protrusion of cervical intervertebral disc  Gait disturbance   In summary, Mr. Peggye Ley is a 32 year old man with multiple sclerosis.  He has been on Copaxone.  He he did have 1 exacerbation early and then recent MRI shows that there is some breakthrough activity with a small enhancing focus.  We discussed that because we have so many options for treatment that even though the breakthrough activity is mild I would recommend a change in therapy.  He is concerned about safety, especially because he has had multiple viral infections in the past including hepatitis C and several viral STDs.  I went over a few options.  The safest choice would be switching from Copaxone to an interferon such as Rebif.  Advantages of  this approach is that Rebif is not associated with higher risk of viral infections.  However, he is likely to have some side effects tolerating the medication and depression could worsen.  I would recommend a switch to one of the anti-CD20 agents such as Kesimpta or Ocrevus and he will give this some more thought.  A third option would be switching to Vumerity or dimethyl fumarate as infectious disease risk is low.  For now, he would like to stay on Copaxone and I sent  in a refill.  However, he is going to give this some more thought over the holidays and let us know if you would like to make a switch.  Symptomatically, he continues to have some eye pain though it is much better than it was previously.  I sent in a prescription for indomethacin as some headache syndromes respond well to this medication.  He has rare incontinence but would prefer not to go on the treatment for this.  He also has had some depression and anxiety but prefers not to go on the treatment at this time.  He will return to see [place] in 4 months or sooner for new or worsening neurologic symptoms.  Damon Blair A. Epimenio Foot, MD, [facility] 07/05/2023, 2:42 PM Certified in Neurology, Clinical Neurophysiology, Sleep Medicine and Neuroimaging  [facility] [address] [place], [place] [address] [phone]

## 2023-07-05 ENCOUNTER — Encounter: Payer: Self-pay | Admitting: Neurology

## 2023-07-05 ENCOUNTER — Ambulatory Visit (INDEPENDENT_AMBULATORY_CARE_PROVIDER_SITE_OTHER): Payer: Managed Care, Other (non HMO) | Admitting: Neurology

## 2023-07-05 VITALS — BP 129/77 | HR 88 | Ht 69.0 in | Wt 178.5 lb

## 2023-07-05 DIAGNOSIS — M5020 Other cervical disc displacement, unspecified cervical region: Secondary | ICD-10-CM

## 2023-07-05 DIAGNOSIS — F419 Anxiety disorder, unspecified: Secondary | ICD-10-CM | POA: Diagnosis not present

## 2023-07-05 DIAGNOSIS — F902 Attention-deficit hyperactivity disorder, combined type: Secondary | ICD-10-CM

## 2023-07-05 DIAGNOSIS — R269 Unspecified abnormalities of gait and mobility: Secondary | ICD-10-CM

## 2023-07-05 DIAGNOSIS — G35 Multiple sclerosis: Secondary | ICD-10-CM

## 2023-07-05 DIAGNOSIS — Z8619 Personal history of other infectious and parasitic diseases: Secondary | ICD-10-CM

## 2023-07-05 MED ORDER — GLATOPA 40 MG/ML ~~LOC~~ SOSY: PREFILLED_SYRINGE | SUBCUTANEOUS | 4 refills | Status: DC

## 2023-07-05 MED ORDER — INDOMETHACIN 25 MG PO CAPS: 25.0000 mg | ORAL_CAPSULE | Freq: Three times a day (TID) | ORAL | 1 refills | Status: DC | PRN

## 2023-07-12 ENCOUNTER — Encounter: Payer: Self-pay | Admitting: Family Medicine

## 2023-07-12 ENCOUNTER — Ambulatory Visit (INDEPENDENT_AMBULATORY_CARE_PROVIDER_SITE_OTHER): Payer: Managed Care, Other (non HMO) | Admitting: Family Medicine

## 2023-07-12 VITALS — BP 132/88 | HR 79 | Temp 97.6°F | Resp 18 | Ht 69.0 in | Wt 179.5 lb

## 2023-07-12 DIAGNOSIS — E559 Vitamin D deficiency, unspecified: Secondary | ICD-10-CM

## 2023-07-12 DIAGNOSIS — R7309 Other abnormal glucose: Secondary | ICD-10-CM

## 2023-07-12 DIAGNOSIS — G35 Multiple sclerosis: Secondary | ICD-10-CM

## 2023-07-12 DIAGNOSIS — E785 Hyperlipidemia, unspecified: Secondary | ICD-10-CM

## 2023-07-12 DIAGNOSIS — F902 Attention-deficit hyperactivity disorder, combined type: Secondary | ICD-10-CM

## 2023-07-12 DIAGNOSIS — F39 Unspecified mood [affective] disorder: Secondary | ICD-10-CM

## 2023-07-12 DIAGNOSIS — F1910 Other psychoactive substance abuse, uncomplicated: Secondary | ICD-10-CM

## 2023-07-12 DIAGNOSIS — E538 Deficiency of other specified B group vitamins: Secondary | ICD-10-CM

## 2023-07-12 DIAGNOSIS — Z1329 Encounter for screening for other suspected endocrine disorder: Secondary | ICD-10-CM

## 2023-07-12 DIAGNOSIS — A630 Anogenital (venereal) warts: Secondary | ICD-10-CM

## 2023-07-12 NOTE — Progress Notes (Signed)
SUBJECTIVE:   Chief Complaint  Patient presents with   Establish Care   HPI Presents to clinic to establish care  Discussed the use of AI scribe software for clinical note transcription with the patient, who gave verbal consent to proceed.  History of Present Illness The patient, a 32 year old with a history of multiple sclerosis (MS), presents for establishing care. They report persistent pain in the spinal column and neck, radiating down the arms, which they manage with Tylenol, sometimes up to three times a day. The pain is attributed to MS, with lesions identified in the cervical spine. The patient was diagnosed with MS in July 2020, following an onset of nystagmus. Since then, they have experienced two hospitalizations due to MS attacks, the most recent in October, which was associated with an ocular disturbance in the left eye causing extreme light sensitivity and migraine-like symptoms. Despite a five-day course of siladryl, the symptoms persisted post-hospitalization.  The patient has been on copaxone therapy, injecting three times a week, and has adopted a high fruit and vegetable diet, consuming about nine cups a day. This regimen has reportedly led to a decrease in the size of the lesions over the past three years. However, a recent MRI revealed a new enhancing lesion in the right parietal lobe.  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 They report using marijuana for MS-related pain and are currently on Marinol. They also take Neurontin twice a day, which was increased to three times a day during their recent hospitalization due to increased pain.  The patient has a history of hepatitis C, which was treated with Epclusa. 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 They have been receiving epidural steroid injections for neck pain, which have been effective. The patient also  reports a history of depression, which is currently managed with therapy sessions. They have expressed interest in trying amitriptyline for neurological pain related to MS.  The patient has a history of high cholesterol and is currently on Crestor. They also take Adderall daily for fatigue and attention issues related to MS, but have expressed interest in switching to Vyvanse. They have a history of smoking cigarettes, with a recent relapse in July, but report currently not smoking.    PERTINENT PMH / PSH: As above  OBJECTIVE:  BP 132/88   Pulse 79   Temp 97.6 F (36.4 C)   Resp 18   Ht 5\' 9"  (1.753 m)   Wt 179 lb 8 oz (81.4 kg)   SpO2 100%   BMI 26.51 kg/m    Physical Exam Vitals reviewed.  HENT:     Head: Normocephalic.     Right Ear: Tympanic membrane, ear canal and external ear normal.     Left Ear: Tympanic membrane, ear canal and external ear normal.     Nose: Nose normal.     Mouth/Throat:     Mouth: Mucous membranes are moist.  Eyes:     Conjunctiva/sclera: Conjunctivae normal.     Pupils: Pupils are equal, round, and reactive to light.  Neck:     Vascular: No carotid bruit.  Cardiovascular:     Rate and Rhythm: Normal rate and regular rhythm.     Pulses: Normal pulses.     Heart sounds: Normal heart sounds.  Pulmonary:     Effort: Pulmonary effort is normal.     Breath sounds: Normal breath sounds.  Abdominal:     General: Abdomen is flat. Bowel sounds are normal.     Palpations: Abdomen is soft.  Musculoskeletal:        General: Normal range of motion.     Cervical back: Normal range of motion and neck supple.     Right lower leg: No edema.     Left lower leg: No edema.  Lymphadenopathy:     Cervical: No cervical adenopathy.  Neurological:     Mental Status: He is alert.  Psychiatric:        Mood and Affect: Mood normal.        Behavior: Behavior normal.        Thought Content: Thought content normal.        Judgment: Judgment normal.         07/12/2023    1:52 PM  Depression screen PHQ 2/9  Decreased Interest 1  Down, Depressed, Hopeless 0  PHQ - 2 Score 1  Altered sleeping 1  Tired, decreased energy 2  Change in appetite 1  Feeling bad or failure about yourself  0  Trouble concentrating 2  Moving slowly or fidgety/restless 2  Suicidal thoughts 0  PHQ-9 Score 9  Difficult doing work/chores Somewhat difficult      07/12/2023    1:53 PM  GAD 7 : Generalized Anxiety Score  Nervous, Anxious, on Edge 3  Control/stop worrying 2  Worry too much - different things 2  Trouble relaxing 3  Restless 2  Easily annoyed or irritable 3  Afraid - awful might happen 1  Total GAD 7 Score 16  Anxiety Difficulty Very difficult    ASSESSMENT/PLAN:  MS (multiple sclerosis) (HCC) Assessment & Plan: Persistent pain in the spinal column and neck, with occasional radiation down the arms. Lesions in the cervical spine. Currently managed with Copaxone injections three times a week, high fruit and vegetable diet, and Tylenol as needed for pain. Recent MRI showed a new enhancing lesion in the right parietal lobe. -Continue current management plan. -Continue Glatiramir 40 mg injection three times weekly -Continue Baclofen 40  mg am and 20 mg pm -Continue Valium 5 mg daily -Continue Dronabinol 10 mg daily -Continue Neurontin 800 mg TID -Continue Naproxen 375 mg BID -Start Protonix 40 mg daily for GI protection while on NSAIDS -Continue to follow up with Dr Epimenio Foot at [facility].   Hyperlipidemia, unspecified hyperlipidemia type Assessment & Plan: Family history of high cholesterol. Currently managed with Crestor 5 mg every other day and Omega-3 supplements. -Order cholesterol labs to assess current management effectiveness.  Orders: -     Lipid panel; Future  Abnormal glucose -     Hemoglobin A1c; Future  ADHD (attention deficit hyperactivity disorder), combined type Assessment & Plan: Currently managed with Adderall, but patient has  expressed preference for Vyvanse due to perceived fluctuations with Adderall. -Consider switching from Adderall to Vyvanse or Straterra -UDS and non opioid contract today   Orders: -     Comprehensive metabolic panel; Future -     ToxASSURE Select 13 (MW), Urine  Genital warts Assessment & Plan: Managed with Aldara 5% 3 times week as needed. -Continue current management plan.   Mood disorder [facility]) Assessment & Plan: Patient reports feelings of sadness, particularly when spending time with his son due to reminders of his brother. Currently seeing a therapist once a week. -Consider introducing an SSRI such as Celexa or Lexapro if symptoms persist or worsen.  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 Currently using marijuana for pain management. -Continue current management plan and support patient's ongoing recovery efforts.   Vitamin D deficiency -     VITAMIN D 25 Hydroxy (Vit-D Deficiency, Fractures); Future  Vitamin B 12 deficiency -     Vitamin B12; Future  Thyroid disorder screening -     TSH; Future   General Health Maintenance -Encourage cessation of smoking.    PDMP reviewed  Return if symptoms worsen or fail to improve, for PCP.  Dana Allan, MD

## 2023-07-12 NOTE — Patient Instructions (Addendum)
It was a pleasure meeting you today. Thank you for allowing me to take part in your health care.  Our goals for today as we discussed include:  Schedule lab appointment.  Fast for 10 hours  Could consider Atomoxetine or Vivanse for ADHD if wanting to switch Will review chart prior to changing medication.  Follow up with Neurology Dr Caryl Pina was the Neurologist you seen at [facility].  I do not think he sees patients in clinic but you can call to check.   This is a list of the screening recommended for you and due dates:  Health Maintenance  Topic Date Due   COVID-19 Vaccine (5 - 2024-25 season) 03/18/2023   Flu Shot  10/15/2023*   DTaP/Tdap/Td vaccine (4 - Td or Tdap) 12/16/2027   Hepatitis C Screening  Completed   HIV Screening  Completed   HPV Vaccine  Aged Out  *Topic was postponed. The date shown is not the original due date.      If you have any questions or concerns, please do not hesitate to call the office at [phone].  I look forward to our next visit and until then take care and stay safe.  Regards,   Dana Allan, MD   [facility]

## 2023-07-15 ENCOUNTER — Encounter: Payer: Self-pay | Admitting: Family Medicine

## 2023-07-15 DIAGNOSIS — F1910 Other psychoactive substance abuse, uncomplicated: Secondary | ICD-10-CM | POA: Insufficient documentation

## 2023-07-15 DIAGNOSIS — F1911 Other psychoactive substance abuse, in remission: Secondary | ICD-10-CM | POA: Insufficient documentation

## 2023-07-15 DIAGNOSIS — F39 Unspecified mood [affective] disorder: Secondary | ICD-10-CM | POA: Insufficient documentation

## 2023-07-15 DIAGNOSIS — E785 Hyperlipidemia, unspecified: Secondary | ICD-10-CM | POA: Insufficient documentation

## 2023-07-15 DIAGNOSIS — A630 Anogenital (venereal) warts: Secondary | ICD-10-CM | POA: Insufficient documentation

## 2023-07-15 DIAGNOSIS — R7309 Other abnormal glucose: Secondary | ICD-10-CM | POA: Insufficient documentation

## 2023-07-15 NOTE — Assessment & Plan Note (Addendum)
Persistent pain in the spinal column and neck, with occasional radiation down the arms. Lesions in the cervical spine. Currently managed with Copaxone injections three times a week, high fruit and vegetable diet, and Tylenol as needed for pain. Recent MRI showed a new enhancing lesion in the right parietal lobe. -Continue current management plan. -Continue Glatiramir 40 mg injection three times weekly -Continue Baclofen 40  mg am and 20 mg pm -Continue Valium 5 mg daily -Continue Dronabinol 10 mg daily -Continue Neurontin 800 mg TID -Continue Naproxen 375 mg BID -Start Protonix 40 mg daily for GI protection while on NSAIDS -Continue to follow up with Dr Epimenio Foot at [facility].

## 2023-07-15 NOTE — Assessment & Plan Note (Signed)
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 Currently using marijuana for pain management. -Continue current management plan and support patient's ongoing recovery efforts.

## 2023-07-15 NOTE — Assessment & Plan Note (Signed)
Patient reports feelings of sadness, particularly when spending time with his son due to reminders of his brother. Currently seeing a therapist once a week. -Consider introducing an SSRI such as Celexa or Lexapro if symptoms persist or worsen.

## 2023-07-15 NOTE — Assessment & Plan Note (Addendum)
Currently managed with Adderall, but patient has expressed preference for Vyvanse due to perceived fluctuations with Adderall. -Consider switching from Adderall to Vyvanse or Straterra -UDS and non opioid contract today

## 2023-07-15 NOTE — Assessment & Plan Note (Signed)
Managed with Aldara 5% 3 times week as needed. -Continue current management plan.

## 2023-07-15 NOTE — Assessment & Plan Note (Signed)
>>  ASSESSMENT AND PLAN FOR ATTENTION DEFICIT HYPERACTIVITY DISORDER (ADHD) WRITTEN ON 07/15/2023  3:03 PM BY WALSH, TANYA, MD  Currently managed with Adderall, but patient has expressed preference for Vyvanse due to perceived fluctuations with Adderall. -Consider switching from Adderall to Vyvanse or Straterra -UDS and non opioid contract today

## 2023-07-15 NOTE — Assessment & Plan Note (Signed)
Family history of high cholesterol. Currently managed with Crestor 5 mg every other day and Omega-3 supplements. -Order cholesterol labs to assess current management effectiveness.

## 2023-07-16 ENCOUNTER — Ambulatory Visit
Admission: RE | Admit: 2023-07-16 | Discharge: 2023-07-16 | Disposition: A | Discharge status: Home / Self Care | Payer: Managed Care, Other (non HMO) | Source: Ambulatory Visit | Attending: Student in an Organized Health Care Education/Training Program | Admitting: Student in an Organized Health Care Education/Training Program

## 2023-07-16 ENCOUNTER — Other Ambulatory Visit (INDEPENDENT_AMBULATORY_CARE_PROVIDER_SITE_OTHER): Payer: Managed Care, Other (non HMO)

## 2023-07-16 ENCOUNTER — Encounter: Payer: Self-pay | Admitting: Student in an Organized Health Care Education/Training Program

## 2023-07-16 ENCOUNTER — Other Ambulatory Visit: Payer: Self-pay | Admitting: Student in an Organized Health Care Education/Training Program

## 2023-07-16 ENCOUNTER — Other Ambulatory Visit: Payer: Self-pay | Admitting: Family Medicine

## 2023-07-16 ENCOUNTER — Ambulatory Visit (HOSPITAL_BASED_OUTPATIENT_CLINIC_OR_DEPARTMENT_OTHER): Payer: Managed Care, Other (non HMO) | Admitting: Student in an Organized Health Care Education/Training Program

## 2023-07-16 ENCOUNTER — Encounter: Payer: Self-pay | Admitting: Neurology

## 2023-07-16 DIAGNOSIS — R52 Pain, unspecified: Secondary | ICD-10-CM

## 2023-07-16 DIAGNOSIS — E538 Deficiency of other specified B group vitamins: Secondary | ICD-10-CM

## 2023-07-16 DIAGNOSIS — M4802 Spinal stenosis, cervical region: Secondary | ICD-10-CM

## 2023-07-16 DIAGNOSIS — F902 Attention-deficit hyperactivity disorder, combined type: Secondary | ICD-10-CM | POA: Diagnosis not present

## 2023-07-16 DIAGNOSIS — E559 Vitamin D deficiency, unspecified: Secondary | ICD-10-CM | POA: Diagnosis not present

## 2023-07-16 DIAGNOSIS — E785 Hyperlipidemia, unspecified: Secondary | ICD-10-CM | POA: Diagnosis not present

## 2023-07-16 DIAGNOSIS — R7309 Other abnormal glucose: Secondary | ICD-10-CM

## 2023-07-16 DIAGNOSIS — M5020 Other cervical disc displacement, unspecified cervical region: Secondary | ICD-10-CM | POA: Diagnosis present

## 2023-07-16 DIAGNOSIS — F39 Unspecified mood [affective] disorder: Secondary | ICD-10-CM

## 2023-07-16 DIAGNOSIS — M5412 Radiculopathy, cervical region: Secondary | ICD-10-CM | POA: Insufficient documentation

## 2023-07-16 DIAGNOSIS — Z1329 Encounter for screening for other suspected endocrine disorder: Secondary | ICD-10-CM

## 2023-07-16 LAB — LIPID PANEL
Cholesterol: 200 mg/dL (ref 0–200)
HDL: 63.2 mg/dL (ref >39.00)
LDL Cholesterol: 123 mg/dL — ABNORMAL HIGH (ref 0–99)
NonHDL: 137.2
Total CHOL/HDL Ratio: 3
Triglycerides: 71 mg/dL (ref 0.0–149.0)
VLDL: 14.2 mg/dL (ref 0.0–40.0)

## 2023-07-16 LAB — HEMOGLOBIN A1C: Hgb A1c MFr Bld: 5.5 % (ref 4.6–6.5)

## 2023-07-16 LAB — COMPREHENSIVE METABOLIC PANEL
ALT: 13 U/L (ref 0–53)
AST: 14 U/L (ref 0–37)
Albumin: 4.9 g/dL (ref 3.5–5.2)
Alkaline Phosphatase: 42 U/L (ref 39–117)
BUN: 17 mg/dL (ref 6–23)
CO2: 28 meq/L (ref 19–32)
Calcium: 9.7 mg/dL (ref 8.4–10.5)
Chloride: 106 meq/L (ref 96–112)
Creatinine, Ser: 0.66 mg/dL (ref 0.40–1.50)
GFR: 124.53 mL/min (ref >60.00)
Glucose, Bld: 87 mg/dL (ref 70–99)
Potassium: 4.4 meq/L (ref 3.5–5.1)
Sodium: 141 meq/L (ref 135–145)
Total Bilirubin: 0.5 mg/dL (ref 0.2–1.2)
Total Protein: 7.1 g/dL (ref 6.0–8.3)

## 2023-07-16 LAB — VITAMIN B12: Vitamin B-12: 727 pg/mL (ref 211–911)

## 2023-07-16 LAB — VITAMIN D 25 HYDROXY (VIT D DEFICIENCY, FRACTURES): VITD: 31.8 ng/mL (ref 30.00–100.00)

## 2023-07-16 LAB — TSH: TSH: 0.6 u[IU]/mL (ref 0.35–5.50)

## 2023-07-16 MED ORDER — LIDOCAINE HCL (PF) 2 % IJ SOLN
INTRAMUSCULAR | Status: AC
  Filled 2023-07-16: qty 5

## 2023-07-16 MED ORDER — DIAZEPAM 5 MG PO TABS
ORAL_TABLET | ORAL | Status: AC
  Filled 2023-07-16: qty 1

## 2023-07-16 MED ORDER — DEXAMETHASONE SODIUM PHOSPHATE 10 MG/ML IJ SOLN
INTRAMUSCULAR | Status: AC
  Filled 2023-07-16: qty 1

## 2023-07-16 MED ORDER — ROPIVACAINE HCL 2 MG/ML IJ SOLN
1.0000 mL | Freq: Once | INTRAMUSCULAR | Status: AC
  Administered 2023-07-16: 1 mL via EPIDURAL

## 2023-07-16 MED ORDER — SODIUM CHLORIDE 0.9% FLUSH
1.0000 mL | Freq: Once | INTRAVENOUS | Status: AC
  Administered 2023-07-16: 1 mL

## 2023-07-16 MED ORDER — SODIUM CHLORIDE (PF) 0.9 % IJ SOLN
INTRAMUSCULAR | Status: AC
  Filled 2023-07-16: qty 10

## 2023-07-16 MED ORDER — IOHEXOL 180 MG/ML  SOLN
INTRAMUSCULAR | Status: AC
  Filled 2023-07-16: qty 20

## 2023-07-16 MED ORDER — DEXAMETHASONE SODIUM PHOSPHATE 10 MG/ML IJ SOLN
10.0000 mg | Freq: Once | INTRAMUSCULAR | Status: AC
  Administered 2023-07-16: 10 mg

## 2023-07-16 MED ORDER — ROPIVACAINE HCL 2 MG/ML IJ SOLN
INTRAMUSCULAR | Status: AC
  Filled 2023-07-16: qty 20

## 2023-07-16 MED ORDER — LIDOCAINE HCL 2 % IJ SOLN
20.0000 mL | Freq: Once | INTRAMUSCULAR | Status: AC
  Administered 2023-07-16: 100 mg

## 2023-07-16 MED ORDER — DIAZEPAM 5 MG PO TABS
5.0000 mg | ORAL_TABLET | ORAL | Status: AC
  Administered 2023-07-16: 5 mg via ORAL

## 2023-07-16 MED ORDER — IOHEXOL 180 MG/ML  SOLN
10.0000 mL | Freq: Once | INTRAMUSCULAR | Status: AC
  Administered 2023-07-16: 10 mL via EPIDURAL

## 2023-07-16 NOTE — Patient Instructions (Signed)

## 2023-07-16 NOTE — Progress Notes (Signed)
Safety precautions to be maintained throughout the outpatient stay will include: orient to surroundings, keep bed in low position, maintain call bell within reach at all times, provide assistance with transfer out of bed and ambulation.  

## 2023-07-16 NOTE — Progress Notes (Signed)
PROVIDER NOTE: Interpretation of information contained herein should be left to medically-trained personnel. Specific patient instructions are provided elsewhere under "Patient Instructions" section of medical record. This document was created in part using STT-dictation technology, any transcriptional errors that may result from this process are unintentional.  Patient: Damon Blair Type: Established DOB: [birth year] MRN: [id number] PCP: Dana Allan, MD  Service: Procedure DOS: 07/16/2023 Setting: Ambulatory Location: Ambulatory outpatient facility Delivery: Face-to-face Provider: Edward Jolly, MD Specialty: Interventional Pain Management Specialty designation: 09 Location: Outpatient facility Ref. Prov.: Edward Jolly, MD       Interventional Therapy   Procedure: Cervical Epidural Steroid injection (CESI) (Interlaminar) #1  Laterality: Left  Level: C7-T1 Imaging: Fluoroscopy-assisted DOS: 07/16/2023  Performed by: Edward Jolly, MD Anesthesia: Local anesthesia (1-2% Lidocaine) Sedation: Minimal Sedation                         Purpose: Diagnostic/Therapeutic Indications: Cervicalgia, cervical radicular pain, degenerative disc disease, severe enough to impact quality of life or function. 1. Cervical radicular pain   2. Protrusion of cervical intervertebral disc   3. Cervical stenosis of spine    NAS-11 score:   Pre-procedure: 7 /10   Post-procedure: 7 /10      Position  Prep  Materials:  Location setting: Procedure suite Position: Prone, on modified reverse trendelenburg to facilitate breathing, with head in head-cradle. Pillows positioned under chest (below chin-level) with cervical spine flexed. Safety Precautions: Patient was assessed for positional comfort and pressure points before starting the procedure. Prepping solution: DuraPrep (Iodine Povacrylex [0.7% available iodine] and Isopropyl Alcohol, 74% w/w) Prep Area: Entire  cervicothoracic region Approach:  percutaneous, paramedial Intended target: Posterior cervical epidural space Materials Procedure:  Tray: Epidural Needle(s): Epidural (Tuohy) Qty: 1 Length: (90mm) 3.5-inch Gauge: 22G   H&P (Pre-op Assessment):  Damon Blair is a 32 y.o. (year old), male patient, seen today for interventional treatment. He  has a past surgical history that includes Fracture surgery (Left); Cystoscopy/ureteroscopy/holmium laser/stent placement (Left, 11/18/2021); and Cystoscopy w/ retrogrades (11/18/2021). Damon Blair has a current medication list which includes the following prescription(s): acetaminophen, amphetamine-dextroamphetamine, baclofen, cetirizine, vitamin d-3, cyanocobalamin, diazepam, dronabinol, gabapentin, glatopa, naproxen, fish oil, and rosuvastatin. His primarily concern today is the Neck Pain  Initial Vital Signs:  Pulse/HCG Rate: 87ECG Heart Rate: 80 Temp: 97.9 F (36.6 C) Resp: 16 BP: (!) 152/79 SpO2: 100 %  BMI: Estimated body mass index is 26.29 kg/m as calculated from the following:   Height as of this encounter: 5\' 9"  (1.753 m).   Weight as of this encounter: 178 lb (80.7 kg).  Risk Assessment: Allergies: Reviewed. He is allergic to clavulanic acid, dilaudid [hydromorphone], oxycodone-acetaminophen, and oxycodone.  Allergy Precautions: None required Coagulopathies: Reviewed. None identified.  Blood-thinner therapy: None at this time Active Infection(s): Reviewed. None identified. Damon Blair is afebrile  Site Confirmation: Damon Blair was asked to confirm the procedure and laterality before marking the site Procedure checklist: Completed Consent: Before the procedure and under the influence of no sedative(s), amnesic(s), or anxiolytics, the patient was informed of the treatment options, risks and possible complications. To fulfill our ethical and legal obligations, as recommended by the American Medical Association's Code of Ethics, I have informed the patient  of my clinical impression; the nature and purpose of the treatment or procedure; the risks, benefits, and possible complications of the intervention; the alternatives, including doing nothing; the risk(s) and benefit(s) of the alternative treatment(s) or procedure(s); and the risk(s) and benefit(s) of  doing nothing. The patient was provided information about the general risks and possible complications associated with the procedure. These may include, but are not limited to: failure to achieve desired goals, infection, bleeding, organ or nerve damage, allergic reactions, paralysis, and death. In addition, the patient was informed of those risks and complications associated to Spine-related procedures, such as failure to decrease pain; infection (i.e.: Meningitis, epidural or intraspinal abscess); bleeding (i.e.: epidural hematoma, subarachnoid hemorrhage, or any other type of intraspinal or peri-dural bleeding); organ or nerve damage (i.e.: Any type of peripheral nerve, nerve root, or spinal cord injury) with subsequent damage to sensory, motor, and/or autonomic systems, resulting in permanent pain, numbness, and/or weakness of one or several areas of the body; allergic reactions; (i.e.: anaphylactic reaction); and/or death. Furthermore, the patient was informed of those risks and complications associated with the medications. These include, but are not limited to: allergic reactions (i.e.: anaphylactic or anaphylactoid reaction(s)); adrenal axis suppression; blood sugar elevation that in diabetics may result in ketoacidosis or comma; water retention that in patients with history of congestive heart failure may result in shortness of breath, pulmonary edema, and decompensation with resultant heart failure; weight gain; swelling or edema; medication-induced neural toxicity; particulate matter embolism and blood vessel occlusion with resultant organ, and/or nervous system infarction; and/or aseptic necrosis of one  or more joints. Finally, the patient was informed that Medicine is not an exact science; therefore, there is also the possibility of unforeseen or unpredictable risks and/or possible complications that may result in a catastrophic outcome. The patient indicated having understood very clearly. We have given the patient no guarantees and we have made no promises. Enough time was given to the patient to ask questions, all of which were answered to the patient's satisfaction. Mr. Mehner has indicated that he wanted to continue with the procedure. Attestation: I, the ordering provider, attest that I have discussed with the patient the benefits, risks, side-effects, alternatives, likelihood of achieving goals, and potential problems during recovery for the procedure that I have provided informed consent. Date  Time: 07/16/2023  9:13 AM   Pre-Procedure Preparation:  Monitoring: As per clinic protocol. Respiration, ETCO2, SpO2, BP, heart rate and rhythm monitor placed and checked for adequate function Safety Precautions: Patient was assessed for positional comfort and pressure points before starting the procedure. Time-out: I initiated and conducted the "Time-out" before starting the procedure, as per protocol. The patient was asked to participate by confirming the accuracy of the "Time Out" information. Verification of the correct person, site, and procedure were performed and confirmed by me, the nursing staff, and the patient. "Time-out" conducted as per Joint Commission's Universal Protocol (UP.01.01.01). Time: 0935 Start Time: 0935 hrs.  Description  Narrative of Procedure:          Rationale (medical necessity): procedure needed and proper for the diagnosis and/or treatment of the patient's medical symptoms and needs. Start Time: 0935 hrs. Safety Precautions: Aspiration looking for blood return was conducted prior to all injections. At no point did we inject any substances, as a needle was  being advanced. No attempts were made at seeking any paresthesias. Safe injection practices and needle disposal techniques used. Medications properly checked for expiration dates. SDV (single dose vial) medications used. Description of procedure: Protocol guidelines were followed. The patient was assisted into a comfortable position. The target area was identified and the area prepped in the usual manner. Skin & deeper tissues infiltrated with local anesthetic. Appropriate amount of time allowed to pass for  local anesthetics to take effect. Using fluoroscopic guidance, the epidural needle was introduced through the skin, ipsilateral to the reported pain, and advanced to the target area. Posterior laminar os was contacted and the needle walked caudad, until the lamina was cleared. The ligamentum flavum was engaged and the epidural space identified using "loss-of-resistance technique" with 2-3 ml of PF-NaCl (0.9% NSS), in a 5cc dedicated LOR syringe. (See "Imaging guidance" below for use of contrast details.) Once proper needle placement was secured, and negative aspiration confirmed, the solution was injected in intermittent fashion, asking for systemic symptoms every 0.5cc. The needles were then removed and the area cleansed, making sure to leave some of the prepping solution back to take advantage of its long term bactericidal properties.  Vitals:   07/16/23 0913 07/16/23 0930 07/16/23 0935 07/16/23 0940  BP: (!) 152/79 131/86 121/74 124/77  Pulse: 87     Resp: 16 17 15 18   Temp: 97.9 F (36.6 C)     TempSrc: Temporal     SpO2: 100% 100% 100% 100%  Weight: 178 lb (80.7 kg)     Height: 5\' 9"  (1.753 m)        End Time: 0939 hrs.  Imaging Guidance (Spinal):          Type of Imaging Technique: Fluoroscopy Guidance (Spinal) Indication(s): Fluoroscopy guidance for needle placement to enhance accuracy in procedures requiring precise needle localization for targeted delivery of medication in or near  specific anatomical locations not easily accessible without such real-time imaging assistance. Exposure Time: Please see nurses notes. Contrast: Before injecting any contrast, we confirmed that the patient did not have an allergy to iodine, shellfish, or radiological contrast. Once satisfactory needle placement was completed at the desired level, radiological contrast was injected. Contrast injected under live fluoroscopy. No contrast complications. See chart for type and volume of contrast used. Fluoroscopic Guidance: I was personally present during the use of fluoroscopy. "Tunnel Vision Technique" used to obtain the best possible view of the target area. Parallax error corrected before commencing the procedure. "Direction-depth-direction" technique used to introduce the needle under continuous pulsed fluoroscopy. Once target was reached, antero-posterior, oblique, and lateral fluoroscopic projection used confirm needle placement in all planes. Images permanently stored in EMR. Interpretation: I personally interpreted the imaging intraoperatively. Adequate needle placement confirmed in multiple planes. Appropriate spread of contrast into desired area was observed. No evidence of afferent or efferent intravascular uptake. No intrathecal or subarachnoid spread observed. Permanent images saved into the patient's record.  Post-operative Assessment:  Post-procedure Vital Signs:  Pulse/HCG Rate: 8777 Temp: 97.9 F (36.6 C) Resp: 18 BP: 124/77 SpO2: 100 %  EBL: None  Complications: No immediate post-treatment complications observed by team, or reported by patient.  Note: The patient tolerated the entire procedure well. A repeat set of vitals were taken after the procedure and the patient was kept under observation following institutional policy, for this type of procedure. Post-procedural neurological assessment was performed, showing return to baseline, prior to discharge. The patient was provided with  post-procedure discharge instructions, including a section on how to identify potential problems. Should any problems arise concerning this procedure, the patient was given instructions to immediately contact us, at any time, without hesitation. In any case, we plan to contact the patient by telephone for a follow-up status report regarding this interventional procedure.  Comments:  No additional relevant information.  Plan of Care (POC)  Orders:  No orders of the defined types were placed in this encounter.  Medications ordered for procedure: Meds ordered this encounter  Medications   iohexol (OMNIPAQUE) 180 MG/ML injection 10 mL    Must be Myelogram-compatible. If not available, you may substitute with a water-soluble, non-ionic, hypoallergenic, myelogram-compatible radiological contrast medium.   lidocaine (XYLOCAINE) 2 % (with pres) injection 400 mg   diazepam (VALIUM) tablet 5 mg    Make sure Flumazenil is available in the pyxis when using this medication. If oversedation occurs, administer 0.2 mg IV over 15 sec. If after 45 sec no response, administer 0.2 mg again over 1 min; may repeat at 1 min intervals; not to exceed 4 doses (1 mg)   ropivacaine (PF) 2 mg/mL (0.2%) (NAROPIN) injection 1 mL   sodium chloride flush (NS) 0.9 % injection 1 mL   dexamethasone (DECADRON) injection 10 mg   Medications administered: We administered iohexol, lidocaine, diazepam, ropivacaine (PF) 2 mg/mL (0.2%), sodium chloride flush, and dexamethasone.  See the medical record for exact dosing, route, and time of administration.  Follow-up plan:   Return in about 3 weeks (around 08/06/2023) for PPE, F2F.       Left C-ESI 07/16/23    Recent Visits Date Type Provider Dept  06/19/23 Office Visit Edward Jolly, MD [facility]  Showing recent visits within past 90 days and meeting all other requirements Today's Visits Date Type Provider Dept  07/16/23 Procedure visit Edward Jolly, MD  [facility]  Showing today's visits and meeting all other requirements Future Appointments Date Type Provider Dept  08/08/23 Appointment Edward Jolly, MD [facility]  Showing future appointments within next 90 days and meeting all other requirements  Disposition: Discharge home  Discharge (Date  Time): 07/16/2023; 0950 hrs.   Primary Care Physician: Dana Allan, MD Location: [facility] Note by: Edward Jolly, MD (TTS technology used. I apologize for any typographical errors that were not detected and corrected.) Date: 07/16/2023; Time: 10:17 AM  Disclaimer:  Medicine is not an Visual merchandiser. The only guarantee in medicine is that nothing is guaranteed. It is important to note that the decision to proceed with this intervention was based on the information collected from the patient. The Data and conclusions were drawn from the patient's questionnaire, the interview, and the physical examination. Because the information was provided in large part by the patient, it cannot be guaranteed that it has not been purposely or unconsciously manipulated. Every effort has been made to obtain as much relevant data as possible for this evaluation. It is important to note that the conclusions that lead to this procedure are derived in large part from the available data. Always take into account that the treatment will also be dependent on availability of resources and existing treatment guidelines, considered by other Pain Management Practitioners as being common knowledge and practice, at the time of the intervention. For Medico-Legal purposes, it is also important to point out that variation in procedural techniques and pharmacological choices are the acceptable norm. The indications, contraindications, technique, and results of the above procedure should only be interpreted and judged by a Board-Certified Interventional Pain Specialist with extensive  familiarity and expertise in the same exact procedure and technique.

## 2023-07-17 ENCOUNTER — Telehealth: Payer: Self-pay

## 2023-07-17 ENCOUNTER — Telehealth: Payer: Self-pay | Admitting: *Deleted

## 2023-07-17 NOTE — Telephone Encounter (Signed)
Attempted to call for post procedure follow-up. Message left. 

## 2023-07-17 NOTE — Telephone Encounter (Signed)
Vm left informing pt to cb for labs

## 2023-07-19 ENCOUNTER — Telehealth: Payer: Self-pay

## 2023-07-19 LAB — TOXASSURE SELECT 13 (MW), URINE

## 2023-07-19 NOTE — Telephone Encounter (Signed)
-----   Message from Glenys Ferrari sent at 07/16/2023  7:00 PM EST ----- Total cholesterol has decreased, at high normal.  LDL (bad) cholesterol was 876 which is also elevated (nml is <99).  You can decrease these numbers by: -limiting your intake of processed foods and foods high in saturated fats.  -increasing your physical activity -increasing your intake of vegetables, if not allergic, healthy fish (bluefin tuna, wild salmon, and sardines), whole grains and fiber rich foods .   -You can also use extra virgin olive oil instead of butter. -If you smoke, quitting can decrease LDL cholesterol and raise HDL cholesterol.  Bad cholesterol has not significantly decreased with the addition of Crestor . Can trial 6 months off Crestor  and recheck level.  If elevates would recommend to restart but at 10 mg.   Can schedule appointment to discuss if having questions.  Vitamin D  is low normal. Increase Vitamin D  to 2 tablets daily.  Over the counter.   All other blood work acceptable

## 2023-07-19 NOTE — Progress Notes (Deleted)
 Telephone Visit- Progress Note: Referring Physician:  Sherial Bail, MD [address] [place],  [place] [address]  Primary Physician:  Damon Merle, MD  This visit was performed via telephone.  Patient location: home Provider location: office  I spent a total of *** minutes non-face-to-face activities for this visit on the date of this encounter including review of current clinical condition and response to treatment.    Patient has given verbal consent to this telephone visits and we reviewed the limitations of a telephone visit. Patient wishes to proceed.    Chief Complaint:  recheck   History of Present Illness: Damon Blair is a 33 y.o. male has a history of MS, ADHD, hepatitis C, and anxiety.    He has known T2 hyperintense foci in spinal cord at C4 and C5 that are unchanged. He has diffuse cervical spondylosis with mild bilateral foraminal stenosis C3-C4 and mild central stenosis C5-C6 with mild right foraminal stenosis.   He was sent to pain management at last visit. Discussed revisiting PT at some point when vision improved.   Phone visit scheduled for follow up.    He had left C7-T1 [place] ESI with Dr. Marcelino on 07/16/23.       He has constant neck pain with some radiation into left arm to his elbow. He has numbness/tingling in both hands. He has weakness in his right hand, especially in right thumb. Pain is better with ice. Pain is worse with moving his neck. No dexterity issues.    He is on tylenol , baclofen , naproxen , and neurontin .    Bowel/Bladder Dysfunction: he has some urinary leaking x years. No bowel incontinence.    Conservative measures:  Physical therapy: has not participated in  Multimodal medical therapy including regular antiinflammatories: valium , baclofen , naproxen , zanaflex , neurontin   Injections:  Cervical ESI in May with good relief (in [place])- Dr. Tobie [facility])   Past Surgery: no prior spinal surgeries    Damon Blair has no symptoms of cervical myelopathy.   The symptoms are causing a significant impact on the patient's life.    Exam: No exam done as this was a telephone encounter.     Imaging: Nothing new.   Assessment and Plan: [name] has constant neck pain with some radiation into left arm to his elbow. He has numbness/tingling in both hands. He has weakness in his right hand, especially in right thumb.   Admitted recently for MS flare and vision changes. Is seeing eye doctor and neurology for this.    He has known T2 hyperintense foci in spinal cord at C4 and C5 that are unchanged. He has diffuse cervical spondylosis with mild bilateral foraminal stenosis C3-C4 and mild central stenosis C5-C6 with mild right foraminal stenosis.    He had previous relief with cervical ESI last year in [place].    Treatment options discussed with patient and following plan made:    - Referral to pain management (Damon Blair) to consider cervical injections.  - May revisit PT at some point- he would not be a good candidate now.  - ROI signed to get records from [facility] (Dr. Tobie) in [place]. He did his last ESI.  - Follow up with me in 6-8 weeks for phone visit.   Treatment options discussed with patient and following plan made:   - Order for physical therapy for *** spine ***. Patient to call to schedule appointment. *** - Continue current medications including ***. Reviewed dosing and side effects.  -  Prescription for ***. Reviewed dosing and side effects. Take with food.  - Prescription for *** to take prn muscle spasms. Reviewed dosing and side effects. Discussed this can cause drowsiness.  - MRI of *** to further evaluate *** radiculopathy. No improvement time or medications (***).  - Referral to PMR at [facility] to discuss possible *** injections.  - Will schedule phone visit to review MRI results once I get them back.   Damon Boys PA-C Neurosurgery

## 2023-07-19 NOTE — Telephone Encounter (Signed)
 Left message to return call to our office.

## 2023-07-20 ENCOUNTER — Telehealth: Payer: Managed Care, Other (non HMO) | Admitting: Orthopedic Surgery

## 2023-07-20 DIAGNOSIS — M5412 Radiculopathy, cervical region: Secondary | ICD-10-CM

## 2023-07-20 DIAGNOSIS — M47812 Spondylosis without myelopathy or radiculopathy, cervical region: Secondary | ICD-10-CM

## 2023-07-20 DIAGNOSIS — G35 Multiple sclerosis: Secondary | ICD-10-CM

## 2023-07-23 ENCOUNTER — Other Ambulatory Visit: Payer: Self-pay | Admitting: Neurology

## 2023-07-23 ENCOUNTER — Other Ambulatory Visit: Payer: Self-pay

## 2023-07-23 MED ORDER — DIAZEPAM 5 MG PO TABS: 5.0000 mg | ORAL_TABLET | Freq: Every day | ORAL | 3 refills | Status: DC

## 2023-07-23 MED ORDER — DRONABINOL 5 MG PO CAPS: ORAL_CAPSULE | ORAL | 0 refills | Status: DC

## 2023-07-28 ENCOUNTER — Encounter: Payer: Self-pay | Admitting: Family Medicine

## 2023-07-30 MED ORDER — AMPHETAMINE-DEXTROAMPHETAMINE 10 MG PO TABS: 10.0000 mg | ORAL_TABLET | Freq: Every day | ORAL | 0 refills | Status: DC

## 2023-08-08 ENCOUNTER — Ambulatory Visit
Payer: Managed Care, Other (non HMO) | Attending: Student in an Organized Health Care Education/Training Program | Admitting: Student in an Organized Health Care Education/Training Program

## 2023-08-08 ENCOUNTER — Encounter: Payer: Self-pay | Admitting: Student in an Organized Health Care Education/Training Program

## 2023-08-08 VITALS — BP 125/79 | HR 85 | Temp 97.7°F | Resp 16 | Ht 69.0 in | Wt 180.0 lb

## 2023-08-08 DIAGNOSIS — M4802 Spinal stenosis, cervical region: Secondary | ICD-10-CM | POA: Diagnosis present

## 2023-08-08 DIAGNOSIS — M5124 Other intervertebral disc displacement, thoracic region: Secondary | ICD-10-CM | POA: Insufficient documentation

## 2023-08-08 DIAGNOSIS — M5020 Other cervical disc displacement, unspecified cervical region: Secondary | ICD-10-CM | POA: Diagnosis present

## 2023-08-08 DIAGNOSIS — M5414 Radiculopathy, thoracic region: Secondary | ICD-10-CM | POA: Diagnosis not present

## 2023-08-08 DIAGNOSIS — M5412 Radiculopathy, cervical region: Secondary | ICD-10-CM | POA: Diagnosis not present

## 2023-08-08 NOTE — Patient Instructions (Signed)

## 2023-08-08 NOTE — Progress Notes (Signed)
PROVIDER NOTE: Information contained herein reflects review and annotations entered in association with encounter. Interpretation of such information and data should be left to medically-trained personnel. Information provided to patient can be located elsewhere in the medical record under "Patient Instructions". Document created using STT-dictation technology, any transcriptional errors that may result from process are unintentional.    Patient: Damon Blair  Service Category: E/M  Provider: Edward Jolly, MD  DOB: 1991/06/27  DOS: 08/08/2023  Referring Provider: Dana Allan, MD  MRN: [id number]  Specialty: Interventional Pain Management  PCP: Dana Allan, MD  Type: Established Patient  Setting: Ambulatory outpatient    Location: Office  Delivery: Face-to-face     HPI  Mr. Jaimin Plitt, a 33 y.o. year old male, is here today because of his Cervical radicular pain [M54.12]. Mr. Kifle primary complain today is Neck Pain  Pertinent problems: Mr. Hewitson does not have any pertinent problems on file. Pain Assessment: Severity of Chronic pain is reported as a 3 /10. Location: Neck  /tingling and numbness in finger tips. Onset: More than a month ago. Quality: Sharp, Throbbing. Timing: Constant. Modifying factor(s): stretching, exercise, sitting, ice. Vitals:  height is 5\' 9"  (1.753 m) and weight is 180 lb (81.6 kg). His temporal temperature is 97.7 F (36.5 C). His blood pressure is 125/79 and his pulse is 85. His respiration is 16 and oxygen saturation is 100%.  BMI: Estimated body mass index is 26.58 kg/m as calculated from the following:   Height as of this encounter: 5\' 9"  (1.753 m).   Weight as of this encounter: 180 lb (81.6 kg). Last encounter: 06/19/2023. Last procedure: 07/16/2023.  Reason for encounter: post-procedure evaluation and assessment.   Post-procedure evaluation    Procedure: Cervical Epidural Steroid injection (CESI) (Interlaminar) #1  Laterality: Left   Level: C7-T1 Imaging: Fluoroscopy-assisted DOS: 07/16/2023  Performed by: Edward Jolly, MD Anesthesia: Local anesthesia (1-2% Lidocaine) Sedation: Minimal Sedation                         Purpose: Diagnostic/Therapeutic Indications: Cervicalgia, cervical radicular pain, degenerative disc disease, severe enough to impact quality of life or function. 1. Cervical radicular pain   2. Protrusion of cervical intervertebral disc   3. Cervical stenosis of spine    NAS-11 score:   Pre-procedure: 7 /10   Post-procedure: 7 /10     Effectiveness:  Initial hour after procedure: 20 %  Subsequent 4-6 hours post-procedure: 20 %  Analgesia past initial 6 hours: 80 %  Ongoing improvement:  Analgesic:  80% Function: Somewhat improved ROM: Mr. Hoganson reports improvement in ROM   ROS  Constitutional: Denies any fever or chills Gastrointestinal: No reported hemesis, hematochezia, vomiting, or acute GI distress Musculoskeletal:  mid thoracic pain Neurological: No reported episodes of acute onset apraxia, aphasia, dysarthria, agnosia, amnesia, paralysis, loss of coordination, or loss of consciousness  Medication Review  Fish Oil, Glatiramer Acetate, Vitamin D-3, amphetamine-dextroamphetamine, baclofen, cetirizine, cyanocobalamin, diazepam, dronabinol, gabapentin, naproxen, and rosuvastatin  History Review  Allergy: Mr. Persson is allergic to clavulanic acid, dilaudid [hydromorphone], oxycodone-acetaminophen, and oxycodone. Drug: Mr. Nankervis  reports that he does not currently use drugs. Alcohol:  reports that he does not currently use alcohol. Tobacco:  reports that he has quit smoking. His smoking use included cigarettes. He has never been exposed to tobacco smoke. He has quit using smokeless tobacco.  His smokeless tobacco use included snuff. Social: Mr. Fier  reports that he has quit smoking. His smoking use  included cigarettes. He has never been exposed to tobacco  smoke. He has quit using smokeless tobacco.  His smokeless tobacco use included snuff. He reports that he does not currently use alcohol. He reports that he does not currently use drugs. Medical:  has a past medical history of Anxiety, Arthritis, Kidney stones, MS (multiple sclerosis) (HCC), and Neuromuscular disorder (HCC). Surgical: Mr. Rodriques  has a past surgical history that includes Fracture surgery (Left); Cystoscopy/ureteroscopy/holmium laser/stent placement (Left, 11/18/2021); and Cystoscopy w/ retrogrades (11/18/2021). Family: family history includes ADD / ADHD in his father; Anxiety disorder in his mother; Arthritis in his father, maternal grandmother, and mother; Asthma in his maternal grandmother; Early death in his brother; Hearing loss in his father; Hypertension in his father.  Laboratory Chemistry Profile   Renal Lab Results  Component Value Date   BUN 17 07/16/2023   CREATININE 0.66 07/16/2023   GFR 124.53 07/16/2023   GFRNONAA >60 06/01/2023    Hepatic Lab Results  Component Value Date   AST 14 07/16/2023   ALT 13 07/16/2023   ALBUMIN 4.9 07/16/2023   ALKPHOS 42 07/16/2023   LIPASE 34 01/20/2022    Electrolytes Lab Results  Component Value Date   NA 141 07/16/2023   K 4.4 07/16/2023   CL 106 07/16/2023   CALCIUM 9.7 07/16/2023   MG 1.7 05/08/2023    Bone Lab Results  Component Value Date   VD25OH 31.80 07/16/2023    Inflammation (CRP: Acute Phase) (ESR: Chronic Phase) Lab Results  Component Value Date   LATICACIDVEN 0.7 11/15/2021         Note: Above Lab results reviewed.  Recent Imaging Review   Narrative & Impression  CLINICAL DATA:  Initial evaluation for acute myelopathy.   EXAM: MRI THORACIC SPINE WITHOUT CONTRAST   TECHNIQUE: Multiplanar, multisequence MR imaging of the thoracic spine was performed. No intravenous contrast was administered.   COMPARISON:  Prior study from 05/28/2022.   FINDINGS: Alignment: Vertebral bodies normally  aligned with preservation of the normal thoracic kyphosis. No listhesis.   Vertebrae: Vertebral body height maintained without acute or chronic fracture. Bone marrow signal intensity within normal limits. No discrete or worrisome osseous lesions or abnormal marrow edema.   Cord: Normal signal and morphology. No visible cord signal changes to suggest demyelinating disease.   Paraspinal and other soft tissues: Unremarkable.   Disc levels:   T6-7: Minimal disc bulge with superimposed tiny central disc protrusion. Minimal flattening of the ventral cord without cord sin changes or significant spinal stenosis. Foramina remain patent.   T7-8: Right paracentral disc protrusion indents the right ventral thecal sac. Secondary flattening of the right hemi cord without cord signal changes or significant spinal stenosis. Foramina remain patent.   T8-9: Minimal disc bulge. No spinal stenosis. Foramina remain patent.   T9-10: Negative interspace. Minimal facet hypertrophy. Note stenosis.   T10-11: Negative interspace.  Mild facet hypertrophy.  No stenosis.   Otherwise, no other significant disc pathology or facet degeneration seen elsewhere within the thoracic spine. No other canal or neural foraminal stenosis or evidence for neural impingement.   IMPRESSION: 1. Normal MRI appearance of the thoracic spinal cord. No cord signal changes to suggest demyelinating disease or other abnormality. 2. Small disc protrusions at T6-7 and T7-8 with minimal cord flattening, but no cord signal changes or significant spinal stenosis.     Electronically Signed   By: Rise Mu M.D.   On: 12/02/2022 23:12   Note: Reviewed  Physical Exam  General appearance: Well nourished, well developed, and well hydrated. In no apparent acute distress Mental status: Alert, oriented x 3 (person, place, & time)       Respiratory: No evidence of acute respiratory distress Eyes: PERLA Vitals: BP  125/79   Pulse 85   Temp 97.7 F (36.5 C) (Temporal)   Resp 16   Ht 5\' 9"  (1.753 m)   Wt 180 lb (81.6 kg)   SpO2 100%   BMI 26.58 kg/m  BMI: Estimated body mass index is 26.58 kg/m as calculated from the following:   Height as of this encounter: 5\' 9"  (1.753 m).   Weight as of this encounter: 180 lb (81.6 kg). Ideal: Ideal body weight: 70.7 kg (155 lb 13.8 oz) Adjusted ideal body weight: 75.1 kg (165 lb 8.3 oz)  Cervical Spine Area Exam  Skin & Axial Inspection: No masses, redness, edema, swelling, or associated skin lesions Alignment: Symmetrical Functional ROM: Improved after treatment      Stability: No instability detected Muscle Tone/Strength: Functionally intact. No obvious neuro-muscular anomalies detected. Sensory (Neurological): Unimpaired Palpation: No palpable anomalies              Thoracic Spine Area Exam  Skin & Axial Inspection: No masses, redness, or swelling Alignment: Symmetrical Functional ROM: Pain restricted ROM Stability: No instability detected Muscle Tone/Strength: Functionally intact. No obvious neuro-muscular anomalies detected. Sensory (Neurological): Dermatomal pain pattern Muscle strength & Tone: No palpable anomalies  Assessment   Diagnosis Status  1. Cervical radicular pain   2. Thoracic disc herniation   3. Radicular pain of thoracic region   4. Protrusion of cervical intervertebral disc   5. Cervical stenosis of spine    Controlled Having a Flare-up Having a Flare-up     Plan of Care  Patient is doing well after his cervical epidural steroid injection.  Notes improvement in his cervical radicular pain symptoms.  He is having increased thoracic pain that wraps around laterally.  He has a thoracic disc herniation as noted above.  We discussed a thoracic epidural steroid injection at T7-T8.  Risk and benefits reviewed and patient would like to proceed.   Orders:  Orders Placed This Encounter  Procedures   Thoracic Epidural  Injection    Standing Status:   Future    Expected Date:   08/22/2023    Expiration Date:   11/06/2023    Scheduling Instructions:     Level: T7/T8     Laterality: midline     Sedation: PO Valium     Timeframe: ASAA    Where will this procedure be performed?:   [facility]   Follow-up plan:   Return in about 2 weeks (around 08/22/2023) for T7/T8 ESI, in clinic (PO Valium 5mg ).      Left C-ESI 07/16/23    Recent Visits Date Type Provider Dept  07/16/23 Procedure visit Edward Jolly, MD [facility]  06/19/23 Office Visit Edward Jolly, MD [facility]  Showing recent visits within past 90 days and meeting all other requirements Today's Visits Date Type Provider Dept  08/08/23 Office Visit Edward Jolly, MD [facility]  Showing today's visits and meeting all other requirements Future Appointments No visits were found meeting these conditions. Showing future appointments within next 90 days and meeting all other requirements  I discussed the assessment and treatment plan with the patient. The patient was provided an opportunity to ask questions and all were answered. The patient agreed with the  plan and demonstrated an understanding of the instructions.  Patient advised to call back or seek an in-person evaluation if the symptoms or condition worsens.  Duration of encounter: 20 minutes.  Total time on encounter, as per AMA guidelines included both the face-to-face and non-face-to-face time personally spent by the physician and/or other qualified health care professional(s) on the day of the encounter (includes time in activities that require the physician or other qualified health care professional and does not include time in activities normally performed by clinical staff). Physician's time may include the following activities when performed: Preparing to see the patient (e.g., pre-charting review of records, searching for previously ordered  imaging, lab work, and nerve conduction tests) Review of prior analgesic pharmacotherapies. Reviewing PMP Interpreting ordered tests (e.g., lab work, imaging, nerve conduction tests) Performing post-procedure evaluations, including interpretation of diagnostic procedures Obtaining and/or reviewing separately obtained history Performing a medically appropriate examination and/or evaluation Counseling and educating the patient/family/caregiver Ordering medications, tests, or procedures Referring and communicating with other health care professionals (when not separately reported) Documenting clinical information in the electronic or other health record Independently interpreting results (not separately reported) and communicating results to the patient/ family/caregiver Care coordination (not separately reported)  Note by: Edward Jolly, MD Date: 08/08/2023; Time: 4:29 PM

## 2023-08-13 ENCOUNTER — Telehealth: Payer: Self-pay

## 2023-08-13 NOTE — Telephone Encounter (Signed)
Insurance Treatment Denial Note  Date order was entered:  Order entered by: Edward Jolly, MD Requested treatment: TESI Reason for denial: Documentation of Physical Therapy is missing. Recommended for approval:  see below what they stated   Your doctor told us that you have nerve pain coming from your mid back. A shot to treat this was requested. We cannot approve this request. The notes sent to [place] do not show:  A four week trial of doctor ordered treatment that has not helped.  You will take part in a program to manage pain. This must include: therapy; medicine; information to help you understand your condition; and mental and social support.  We told your doctor about the reason(s) above. Please talk to your doctor if you have questions.  This decision was based on [employer] Comprehensive Musculoskeletal Management Guideline, CMM 200: Epidural Steroid Injections (ESI).

## 2023-08-16 ENCOUNTER — Ambulatory Visit: Payer: Self-pay | Admitting: Family Medicine

## 2023-08-16 NOTE — Telephone Encounter (Signed)
Copied from CRM [id number]. Topic: Clinical - Red Word Triage >> Aug 16, 2023  3:23 PM Almira Coaster wrote: Red Word that prompted transfer to Nurse Triage: Patient is having severe headaches, wet cough, sore throat starting yesterday. Patient states it feels unbearable.   Chief Complaint: Cough Symptoms: Cough, sore throat, headache, body aches  Frequency: Frequent cough Pertinent Negatives: Patient denies shortness of breath, chest pain, recorded fever Disposition: [] ED /[] Urgent Care (no appt availability in office) / [x] Appointment(In office/virtual)/ []  [facility]/ [] Home Care/ [] Refused Recommended Disposition /[] [facility]/ []  Follow-up with PCP Additional Notes: Patient reports that yesterday he began to experience a sore throat and cough. He states that with these symptoms he is also experiencing a headache and body aches. He states his cough has been intermittent and "sometimes it sounds barking." He denies any shortness of breath, chest pain, or recorded fever. Appointment made for the patient tomorrow and patient instructed to call back for any new or worsening symptoms. Patient verbalized understanding and agreement with this plan.     Reason for Disposition  SEVERE coughing spells (e.g., whooping sound after coughing, vomiting after coughing)  Answer Assessment - Initial Assessment Questions 1. ONSET: "When did the cough begin?"      Yesterday  2. SEVERITY: "How bad is the cough today?"      Mild to moderate 3. SPUTUM: "Describe the color of your sputum" (none, dry cough; clear, white, yellow, green)     Clear  4. HEMOPTYSIS: "Are you coughing up any blood?" If so ask: "How much?" (flecks, streaks, tablespoons, etc.)     No 5. DIFFICULTY BREATHING: "Are you having difficulty breathing?" If Yes, ask: "How bad is it?" (e.g., mild, moderate, severe)    - MILD: No SOB at rest, mild SOB with walking, speaks normally in sentences, can lie down, no retractions, pulse  < 100.    - MODERATE: SOB at rest, SOB with minimal exertion and prefers to sit, cannot lie down flat, speaks in phrases, mild retractions, audible wheezing, pulse 100-120.    - SEVERE: Very SOB at rest, speaks in single words, struggling to breathe, sitting hunched forward, retractions, pulse > 120      No 6. FEVER: "Do you have a fever?" If Yes, ask: "What is your temperature, how was it measured, and when did it start?"     Has not checked 7. CARDIAC HISTORY: "Do you have any history of heart disease?" (e.g., heart attack, congestive heart failure)      No 8. LUNG HISTORY: "Do you have any history of lung disease?"  (e.g., pulmonary embolus, asthma, emphysema)     No 9. PE RISK FACTORS: "Do you have a history of blood clots?" (or: recent major surgery, recent prolonged travel, bedridden)     No 10. OTHER SYMPTOMS: "Do you have any other symptoms?" (e.g., runny nose, wheezing, chest pain)       Headache, sore throat, body aches  11. PREGNANCY: "Is there any chance you are pregnant?" "When was your last menstrual period?"       N/A 12. TRAVEL: "Have you traveled out of the country in the last month?" (e.g., travel history, exposures)       No  Protocols used: Cough - Acute Productive-A-AH

## 2023-08-17 ENCOUNTER — Encounter: Payer: Self-pay | Admitting: Internal Medicine

## 2023-08-17 ENCOUNTER — Ambulatory Visit (INDEPENDENT_AMBULATORY_CARE_PROVIDER_SITE_OTHER): Payer: Managed Care, Other (non HMO) | Admitting: Internal Medicine

## 2023-08-17 VITALS — BP 130/88 | HR 95 | Temp 100.9°F | Ht 69.0 in | Wt 179.2 lb

## 2023-08-17 DIAGNOSIS — F1910 Other psychoactive substance abuse, uncomplicated: Secondary | ICD-10-CM

## 2023-08-17 DIAGNOSIS — R051 Acute cough: Secondary | ICD-10-CM

## 2023-08-17 DIAGNOSIS — U071 COVID-19: Secondary | ICD-10-CM | POA: Diagnosis not present

## 2023-08-17 DIAGNOSIS — G35 Multiple sclerosis: Secondary | ICD-10-CM | POA: Diagnosis not present

## 2023-08-17 LAB — POC COVID19 BINAXNOW: SARS Coronavirus 2 Ag: POSITIVE — AB

## 2023-08-17 MED ORDER — ACETAMINOPHEN 325 MG PO TABS: 650.0000 mg | ORAL_TABLET | Freq: Four times a day (QID) | ORAL | 1 refills | Status: DC | PRN

## 2023-08-17 MED ORDER — PREDNISONE 10 MG PO TABS: ORAL_TABLET | ORAL | 0 refills | Status: DC

## 2023-08-17 MED ORDER — AZITHROMYCIN 500 MG PO TABS: 500.0000 mg | ORAL_TABLET | Freq: Every day | ORAL | 0 refills | Status: DC

## 2023-08-17 MED ORDER — BENZONATATE 200 MG PO CAPS: 200.0000 mg | ORAL_CAPSULE | Freq: Three times a day (TID) | ORAL | 1 refills | Status: DC | PRN

## 2023-08-17 NOTE — Assessment & Plan Note (Signed)
HE HAS NO SIGNS OF RESPIRATORY DISTRESS .  ADVISED TO REST,  HYDRATE USE TYLENOL/PREDNISONE AND COUGH SUPPRESSANT   QUARANTINE 5 DAYS,  WORK NOTE FOR PATIENT AND  WIFE WHO HAS ALSO DEVELOPED SYMPTOMS  WRITTEN

## 2023-08-17 NOTE — Telephone Encounter (Signed)
Called pt to follow up on the call. Left message to return call to our office.

## 2023-08-17 NOTE — Progress Notes (Signed)
Subjective:  Patient ID: Kollyn Lingafelter, male    DOB: Jan 21, 1991  Age: 33 y.o. MRN: [id number]  CC: The primary encounter diagnosis was Acute cough. Diagnoses of MS (multiple sclerosis) (HCC), Substance abuse (HCC), and COVID-19 virus infection were also pertinent to this visit.   HPI Eyoel Throgmorton presents for  Chief Complaint  Patient presents with   Covid Positive    33 yr old male with history of MS, hepatitis C  and opiate addiction , in remission,  presents with 2 day history of fever, body aches , rhinitis, . Symptoms started on Wednesday  No recent rTavel but attends weekly AA meetings and has physical contact with strangers at the meetings 9hugs,  hand shakes etc.  He  denies shortness of breath.  Fevers to 100.9    Outpatient Medications Prior to Visit  Medication Sig Dispense Refill   amphetamine-dextroamphetamine (ADDERALL) 10 MG tablet Take 1 tablet (10 mg total) by mouth daily. 30 tablet 0   baclofen (LIORESAL) 20 MG tablet Take 20 mg by mouth 3 (three) times daily. 40 am 20 pm     cetirizine (ZYRTEC) 10 MG tablet Take 10 mg by mouth as needed.     Cholecalciferol (VITAMIN D-3) 25 MCG (1000 UT) CAPS Take by mouth.     cyanocobalamin (VITAMIN B12) 1000 MCG/ML injection Inject 1,000 mcg into the muscle every 30 (thirty) days.     diazepam (VALIUM) 5 MG tablet Take 1 tablet (5 mg total) by mouth at bedtime. 30 tablet 3   dronabinol (MARINOL) 5 MG capsule One po bid 60 capsule 0   gabapentin (NEURONTIN) 800 MG tablet Take 1 tablet (800 mg total) by mouth 3 (three) times daily. 60 tablet 0   Glatiramer Acetate (GLATOPA) 40 MG/ML SOSY One ml subcu three times a week. 36 mL 4   naproxen (NAPROSYN) 375 MG tablet Take 1 tablet (375 mg total) by mouth 2 (two) times daily. 20 tablet 0   Omega-3 Fatty Acids (FISH OIL) 500 MG CAPS Take by mouth.     rosuvastatin (CRESTOR) 5 MG tablet Take 5 mg by mouth daily. (Patient not taking: Reported on 08/08/2023)     No  facility-administered medications prior to visit.    Review of Systems;  Patient denies headache, fevers, malaise, unintentional weight loss, skin rash, eye pain, sinus congestion and sinus pain, sore throat, dysphagia,  hemoptysis , cough, dyspnea, wheezing, chest pain, palpitations, orthopnea, edema, abdominal pain, nausea, melena, diarrhea, constipation, flank pain, dysuria, hematuria, urinary  Frequency, nocturia, numbness, tingling, seizures,  Focal weakness, Loss of consciousness,  Tremor, insomnia, depression, anxiety, and suicidal ideation.      Objective:  BP 130/88   Pulse 95   Temp (!) 100.9 F (38.3 C) (Oral)   Ht 5\' 9"  (1.753 m)   Wt 179 lb 3.2 oz (81.3 kg)   SpO2 98%   BMI 26.46 kg/m   BP Readings from Last 3 Encounters:  08/17/23 130/88  08/08/23 125/79  07/16/23 124/77    Wt Readings from Last 3 Encounters:  08/17/23 179 lb 3.2 oz (81.3 kg)  08/08/23 180 lb (81.6 kg)  07/16/23 178 lb (80.7 kg)    Physical Exam Vitals reviewed.  Constitutional:      General: He is not in acute distress.    Appearance: Normal appearance. He is normal weight. He is not ill-appearing, toxic-appearing or diaphoretic.  HENT:     Head: Normocephalic.  Eyes:     General: No scleral icterus.  Right eye: No discharge.        Left eye: No discharge.     Conjunctiva/sclera: Conjunctivae normal.  Cardiovascular:     Rate and Rhythm: Normal rate and regular rhythm.     Heart sounds: Normal heart sounds.  Pulmonary:     Effort: Pulmonary effort is normal. No respiratory distress.     Breath sounds: Normal breath sounds.  Musculoskeletal:        General: Normal range of motion.     Cervical back: Normal range of motion.  Skin:    General: Skin is warm and dry.  Neurological:     General: No focal deficit present.     Mental Status: He is alert and oriented to person, place, and time. Mental status is at baseline.  Psychiatric:        Mood and Affect: Mood normal.         Behavior: Behavior normal.        Thought Content: Thought content normal.        Judgment: Judgment normal.    Lab Results  Component Value Date   HGBA1C 5.5 07/16/2023    Lab Results  Component Value Date   CREATININE 0.66 07/16/2023   CREATININE 0.77 06/01/2023   CREATININE 0.80 05/08/2023    Lab Results  Component Value Date   WBC 6.2 06/01/2023   HGB 14.3 06/01/2023   HCT 42.4 06/01/2023   PLT 261 06/01/2023   GLUCOSE 87 07/16/2023   CHOL 200 07/16/2023   TRIG 71.0 07/16/2023   HDL 63.20 07/16/2023   LDLCALC 123 (H) 07/16/2023   ALT 13 07/16/2023   AST 14 07/16/2023   NA 141 07/16/2023   K 4.4 07/16/2023   CL 106 07/16/2023   CREATININE 0.66 07/16/2023   BUN 17 07/16/2023   CO2 28 07/16/2023   TSH 0.60 07/16/2023   HGBA1C 5.5 07/16/2023    DG PAIN CLINIC C-ARM 1-60 MIN NO REPORT Result Date: 07/16/2023 Fluoro was used, but no Radiologist interpretation will be provided. Please refer to "NOTES" tab for provider progress note.   Assessment & Plan:  .Acute cough -     POC COVID-19 BinaxNow  MS (multiple sclerosis) (HCC)  Substance abuse (HCC)  COVID-19 virus infection Assessment & Plan: HE HAS NO SIGNS OF RESPIRATORY DISTRESS .  ADVISED TO REST,  HYDRATE USE TYLENOL/PREDNISONE AND COUGH SUPPRESSANT   QUARANTINE 5 DAYS,  WORK NOTE FOR PATIENT AND  WIFE WHO HAS ALSO DEVELOPED SYMPTOMS  WRITTEN    Other orders -     Acetaminophen; Take 2 tablets (650 mg total) by mouth every 6 (six) hours as needed.  Dispense: 90 tablet; Refill: 1 -     predniSONE; 6 tablets on Day 1 , then reduce by 1 tablet daily until gone  Dispense: 21 tablet; Refill: 0 -     Azithromycin; Take 1 tablet (500 mg total) by mouth daily.  Dispense: 7 tablet; Refill: 0 -     Benzonatate; Take 1 capsule (200 mg total) by mouth 3 (three) times daily as needed for cough.  Dispense: 60 capsule; Refill: 1     Follow-up: No follow-ups on file.   Sherlene Shams, MD

## 2023-08-17 NOTE — Patient Instructions (Signed)
Your COVID-19 test was resulted as positive. This was the PCR test, which amplifies the sample taken from you nasal passage and is more sensitive than the home tests.     DO take the prednisone and use the tylenol as needed / Tessalon perles (rx) and/or Delsym for the cough  Add the axithromycin if your sinus drainage or sputum becomes discolored  Quarantine for 5 days starting today

## 2023-08-20 ENCOUNTER — Telehealth: Payer: Self-pay

## 2023-08-20 NOTE — Telephone Encounter (Signed)
Spoke to Dr. Darrick Huntsman verbally about the about the message below. She advised that the pt stop the Prednisone. I have spoke with pt to let him know that he needs to stop the prednisone. Pt gave a verbal understanding. I also advised him that if the nervousness does not get better to please give our office a call. Pt is aware to complete the antibiotics.

## 2023-08-20 NOTE — Telephone Encounter (Signed)
Pt seen Dr. Darrick Huntsman in office.

## 2023-08-20 NOTE — Telephone Encounter (Signed)
Copied from CRM [id number]. Topic: General - Other >> Aug 20, 2023  3:04 PM Rodman Pickle T wrote: Reason for CRM: PATIENT IS HAVING SOME SIDE EFFECTS FROM THE COVID MEDICATION HE IS HAVING SOME ANXIETY  AND INSOMNIA  HE WOULD LIKE TO KNOW IF THERE IS SOMETHING [facility] THAT CAN BE CALLED AND HE HAS BEEN HAVING PANIC ATTACKS  HE WOULD LIKE A CALLBACK

## 2023-08-20 NOTE — Telephone Encounter (Signed)
 LMTCB

## 2023-08-21 ENCOUNTER — Encounter: Payer: Self-pay | Admitting: Orthopedic Surgery

## 2023-08-22 ENCOUNTER — Other Ambulatory Visit: Payer: Self-pay | Admitting: Neurology

## 2023-08-22 ENCOUNTER — Other Ambulatory Visit: Payer: Self-pay | Admitting: Family Medicine

## 2023-08-22 DIAGNOSIS — F902 Attention-deficit hyperactivity disorder, combined type: Secondary | ICD-10-CM

## 2023-08-23 MED ORDER — DRONABINOL 5 MG PO CAPS: ORAL_CAPSULE | ORAL | 0 refills | Status: DC

## 2023-08-23 MED ORDER — AMPHETAMINE-DEXTROAMPHETAMINE 10 MG PO TABS: 10.0000 mg | ORAL_TABLET | Freq: Every day | ORAL | 0 refills | Status: DC

## 2023-08-23 NOTE — Telephone Encounter (Signed)
 Last seen 07/05/23 and next f/u 01/31/24. Last refilled 07/30/23 #60.

## 2023-09-13 ENCOUNTER — Encounter: Payer: Self-pay | Admitting: Family Medicine

## 2023-09-14 ENCOUNTER — Encounter: Payer: Self-pay | Admitting: Nurse Practitioner

## 2023-09-14 ENCOUNTER — Ambulatory Visit (INDEPENDENT_AMBULATORY_CARE_PROVIDER_SITE_OTHER): Payer: Managed Care, Other (non HMO) | Admitting: Nurse Practitioner

## 2023-09-14 VITALS — BP 126/84 | HR 123 | Temp 99.2°F | Ht 69.0 in | Wt 180.2 lb

## 2023-09-14 DIAGNOSIS — J029 Acute pharyngitis, unspecified: Secondary | ICD-10-CM

## 2023-09-14 DIAGNOSIS — R52 Pain, unspecified: Secondary | ICD-10-CM

## 2023-09-14 DIAGNOSIS — J069 Acute upper respiratory infection, unspecified: Secondary | ICD-10-CM

## 2023-09-14 LAB — POCT INFLUENZA A/B
Influenza A, POC: NEGATIVE
Influenza B, POC: NEGATIVE

## 2023-09-14 MED ORDER — GUAIFENESIN ER 600 MG PO TB12: 600.0000 mg | ORAL_TABLET | Freq: Two times a day (BID) | ORAL | 0 refills | Status: DC

## 2023-09-14 MED ORDER — CETIRIZINE HCL 10 MG PO TABS: 10.0000 mg | ORAL_TABLET | Freq: Every day | ORAL | 1 refills | Status: DC

## 2023-09-14 MED ORDER — AMOXICILLIN 500 MG PO TABS: 500.0000 mg | ORAL_TABLET | Freq: Two times a day (BID) | ORAL | 0 refills | Status: DC

## 2023-09-14 MED ORDER — DEXTROMETHORPHAN-GUAIFENESIN 5-100 MG/5ML PO LIQD: 10.0000 mL | Freq: Every evening | ORAL | 0 refills | Status: DC | PRN

## 2023-09-14 MED ORDER — PREDNISONE 10 MG PO TABS: ORAL_TABLET | ORAL | 0 refills | Status: DC

## 2023-09-14 NOTE — Patient Instructions (Addendum)
 Flu is negative. RSV swab pending. Will treat with amoxicillin, prednisone, plain mucinex and cough syrup.  Increase fluid intake and rest. Keep [place] posted regarding the symptoms. If have SOB or symptoms worsen seek emergency care.

## 2023-09-14 NOTE — Progress Notes (Signed)
 Established Patient Office Visit  Subjective:  Patient ID: Damon Blair, male    DOB: 22-Oct-1990  Age: 33 y.o. MRN: [id number]  CC:  Chief Complaint  Patient presents with   Sore Throat   Cough    09/10/23 sore throat then on 09/11/23 bad cough & body aches, now has left ear ache, slight diarrhea, burning sensation in top of chest, feeling as if it is hard to breath, body aches, low grade fever  Discussed the use of a AI scribe software for clinical note transcription with the patient, who gave verbal consent to proceed.   HPI  Damon Blair is a 33 year old male with multiple sclerosis who presents with a worsening cough and sore throat.  He developed a sore throat on Monday, which has persisted, and by Tuesday evening, a dry cough emerged and progressively worsened. The cough is described as deep and dry, with occasional minor wetness and slight yellow coloration. He experiences irritation and a burning sensation in the upper chest, particularly when coughing, SR  with difficulty swallowing and ear pain.  He mentions recent exposure to individuals with similar symptoms, including a child with a dry cough during a play date and adults who recently had the flu. He experiences postnasal drip and sinus drainage, which he attributes to high pollen levels. He is sensitive to allergies and typically takes Zyrtec.   He has been taking DayQuil, Nyquil, and benzonatate benzonatate.  He had previous episode of COVID on 08/17/2023 and was treated with prednisone, and benzonatate and azithromycin.    Past Medical History:  Diagnosis Date   Anxiety    Arthritis    Kidney stones    MS (multiple sclerosis) (HCC)    Neuromuscular disorder (HCC)     Past Surgical History:  Procedure Laterality Date   CYSTOSCOPY W/ RETROGRADES  11/18/2021   Procedure: CYSTOSCOPY WITH RETROGRADE PYELOGRAM;  Surgeon: Sondra Come, MD;  Location: [facility];  Service: Urology;;    CYSTOSCOPY/URETEROSCOPY/HOLMIUM LASER/STENT PLACEMENT Left 11/18/2021   Procedure: CYSTOSCOPY/URETEROSCOPY/HOLMIUM LASER/STENT PLACEMENT;  Surgeon: Sondra Come, MD;  Location: [facility];  Service: Urology;  Laterality: Left;   FRACTURE SURGERY Left     Family History  Problem Relation Age of Onset   Anxiety disorder Mother    Arthritis Mother    ADD / ADHD Father    Arthritis Father    Hearing loss Father    Hypertension Father    Arthritis Maternal Grandmother    Asthma Maternal Grandmother    Early death Brother     Social History   Socioeconomic History   Marital status: Married    Spouse name: Not on file   Number of children: Not on file   Years of education: Not on file   Highest education level: Associate degree: occupational, Scientist, product/process development, or vocational program  Occupational History   Not on file  Tobacco Use   Smoking status: Former    Types: Cigarettes    Passive exposure: Never   Smokeless tobacco: Former    Types: Snuff  Vaping Use   Vaping status: Former  Substance and Sexual Activity   Alcohol use: Not Currently   Drug use: Not Currently   Sexual activity: Yes    Birth control/protection: None  Other Topics Concern   Not on file  Social History Narrative   Not on file   Social Drivers of Health   Financial Resource Strain: High Risk (08/16/2023)   Overall Financial Resource Strain (CARDIA)  Difficulty of Paying Living Expenses: Very hard  Food Insecurity: Food Insecurity Present (08/16/2023)   Hunger Vital Sign    Worried About Running Out of Food in the Last Year: Often true    Ran Out of Food in the Last Year: Often true  Transportation Needs: No Transportation Needs (08/16/2023)   PRAPARE - Administrator, Civil Service (Medical): No    Lack of Transportation (Non-Medical): No  Physical Activity: Sufficiently Active (08/16/2023)   Exercise Vital Sign    Days of Exercise per Week: 7 days    Minutes of Exercise per Session: 30 min   Stress: Stress Concern Present (08/16/2023)   [employer] of Occupational Health - Occupational Stress Questionnaire    Feeling of Stress : To some extent  Social Connections: Socially Integrated (08/16/2023)   Social Connection and Isolation Panel [NHANES]    Frequency of Communication with Friends and Family: Three times a week    Frequency of Social Gatherings with Friends and Family: Twice a week    Attends Religious Services: More than 4 times per year    Active Member of [employer] or Organizations: Yes    Attends Engineer, structural: More than 4 times per year    Marital Status: Married  Catering manager Violence: Not At Risk (05/04/2023)   Humiliation, Afraid, Rape, and Kick questionnaire    Fear of Current or Ex-Partner: No    Emotionally Abused: No    Physically Abused: No    Sexually Abused: No     Outpatient Medications Prior to Visit  Medication Sig Dispense Refill   acetaminophen (TYLENOL) 325 MG tablet Take 2 tablets (650 mg total) by mouth every 6 (six) hours as needed. 90 tablet 1   amphetamine-dextroamphetamine (ADDERALL) 10 MG tablet Take 1 tablet (10 mg total) by mouth daily. 30 tablet 0   baclofen (LIORESAL) 20 MG tablet Take 20 mg by mouth 3 (three) times daily. 40 am 20 pm     Cholecalciferol (VITAMIN D-3) 25 MCG (1000 UT) CAPS Take by mouth.     cyanocobalamin (VITAMIN B12) 1000 MCG/ML injection Inject 1,000 mcg into the muscle every 30 (thirty) days.     diazepam (VALIUM) 5 MG tablet Take 1 tablet (5 mg total) by mouth at bedtime. 30 tablet 3   dronabinol (MARINOL) 5 MG capsule One po bid 60 capsule 0   gabapentin (NEURONTIN) 800 MG tablet Take 1 tablet (800 mg total) by mouth 3 (three) times daily. 60 tablet 0   Glatiramer Acetate (GLATOPA) 40 MG/ML SOSY One ml subcu three times a week. 36 mL 4   naproxen (NAPROSYN) 375 MG tablet Take 1 tablet (375 mg total) by mouth 2 (two) times daily. 20 tablet 0   Omega-3 Fatty Acids (FISH OIL) 500 MG CAPS Take  by mouth.     azithromycin (ZITHROMAX) 500 MG tablet Take 1 tablet (500 mg total) by mouth daily. 7 tablet 0   benzonatate (TESSALON) 200 MG capsule Take 1 capsule (200 mg total) by mouth 3 (three) times daily as needed for cough. 60 capsule 1   cetirizine (ZYRTEC) 10 MG tablet Take 10 mg by mouth as needed.     predniSONE (DELTASONE) 10 MG tablet 6 tablets on Day 1 , then reduce by 1 tablet daily until gone 21 tablet 0   No facility-administered medications prior to visit.    Allergies  Allergen Reactions   Clavulanic Acid    Dilaudid [Hydromorphone]    Oxycodone-Acetaminophen Itching  Oxycodone Itching    ROS Review of Systems Negative unless indicated in HPI.    Objective:    Physical Exam Constitutional:      Appearance: He is well-developed.  HENT:     Right Ear: Tympanic membrane normal. Tympanic membrane is not erythematous.     Left Ear: Tympanic membrane normal. Tympanic membrane is not erythematous.     Nose:     Right Turbinates: Not enlarged.     Left Turbinates: Not enlarged.     Right Sinus: No maxillary sinus tenderness or frontal sinus tenderness.     Left Sinus: No maxillary sinus tenderness or frontal sinus tenderness.     Mouth/Throat:     Mouth: Mucous membranes are moist. Mucous membranes are pale.     Pharynx: No pharyngeal swelling, oropharyngeal exudate or posterior oropharyngeal erythema.     Tonsils: No tonsillar exudate. 1+ on the right. 1+ on the left.  Cardiovascular:     Rate and Rhythm: Normal rate and regular rhythm.  Pulmonary:     Effort: Pulmonary effort is normal.     Breath sounds: No stridor. Rales present. No wheezing.  Neurological:     General: No focal deficit present.     Mental Status: He is alert and oriented to person, place, and time. Mental status is at baseline.  Psychiatric:        Mood and Affect: Mood normal.        Behavior: Behavior normal.        Thought Content: Thought content normal.        Judgment:  Judgment normal.     BP 126/84   Pulse (!) 123   Temp 99.2 F (37.3 C)   Ht 5\' 9"  (1.753 m)   Wt 180 lb 3.2 oz (81.7 kg)   SpO2 99%   BMI 26.61 kg/m  Wt Readings from Last 3 Encounters:  09/14/23 180 lb 3.2 oz (81.7 kg)  08/17/23 179 lb 3.2 oz (81.3 kg)  08/08/23 180 lb (81.6 kg)     Health Maintenance  Topic Date Due   COVID-19 Vaccine (5 - 2024-25 season) 09/30/2023 (Originally 03/18/2023)   INFLUENZA VACCINE  10/15/2023 (Originally 02/15/2023)   DTaP/Tdap/Td (4 - Td or Tdap) 12/16/2027   Hepatitis C Screening  Completed   HIV Screening  Completed   HPV VACCINES  Aged Out    There are no preventive care reminders to display for this patient.  Lab Results  Component Value Date   TSH 0.60 07/16/2023   Lab Results  Component Value Date   WBC 6.2 06/01/2023   HGB 14.3 06/01/2023   HCT 42.4 06/01/2023   MCV 88.0 06/01/2023   PLT 261 06/01/2023   Lab Results  Component Value Date   NA 141 07/16/2023   K 4.4 07/16/2023   CO2 28 07/16/2023   GLUCOSE 87 07/16/2023   BUN 17 07/16/2023   CREATININE 0.66 07/16/2023   BILITOT 0.5 07/16/2023   ALKPHOS 42 07/16/2023   AST 14 07/16/2023   ALT 13 07/16/2023   PROT 7.1 07/16/2023   ALBUMIN 4.9 07/16/2023   CALCIUM 9.7 07/16/2023   ANIONGAP 12 06/01/2023   GFR 124.53 07/16/2023   Lab Results  Component Value Date   CHOL 200 07/16/2023   Lab Results  Component Value Date   HDL 63.20 07/16/2023   Lab Results  Component Value Date   LDLCALC 123 (H) 07/16/2023   Lab Results  Component Value Date   TRIG 71.0  07/16/2023   Lab Results  Component Value Date   CHOLHDL 3 07/16/2023   Lab Results  Component Value Date   HGBA1C 5.5 07/16/2023      Assessment & Plan:  URI with cough and congestion Assessment & Plan: Symptoms started with a sore throat, progressed to a dry cough with some minor productive coughing. Noted irritation and a burning sensation in the upper chest, and difficulty swallowing due to  sorethroat. Recent exposure to multiple sick individuals. Some rales noted on examination. -Start Prednisone. -Start Amoxicillin (due to recent use of Azithromycin). -Continue DayQuil. -Start Tessalon Perles for cough. -Check for RSV with a send-out swab. -If symptoms worsen or difficulty breathing develops, patient advised to go to the ER.   Body aches -     POCT Influenza A/B -     Respiratory virus panel  Pharyngitis, unspecified etiology -     Respiratory virus panel  Other orders -     Cetirizine HCl; Take 1 tablet (10 mg total) by mouth daily.  Dispense: 30 tablet; Refill: 1 -     Amoxicillin; Take 1 tablet (500 mg total) by mouth 2 (two) times daily.  Dispense: 14 tablet; Refill: 0 -     predniSONE; Take 4 tablets ( total 40 mg) by mouth for 2 days; take 3 tablets ( total 30 mg) by mouth for 2 days; take 2 tablets ( total 20 mg) by mouth for 1 day; take 1 tablet ( total 10 mg) by mouth for 1 day.  Dispense: 17 tablet; Refill: 0 -     Dextromethorphan-guaiFENesin; Take 10 mLs by mouth at bedtime as needed (for cough).  Dispense: 118 mL; Refill: 0 -     guaiFENesin ER; Take 1 tablet (600 mg total) by mouth 2 (two) times daily.  Dispense: 30 tablet; Refill: 0    Follow-up: No follow-ups on file.   Kara Dies, NP

## 2023-09-16 LAB — RESPIRATORY VIRUS PANEL
Adenovirus B: NOT DETECTED
HUMAN PARAINFLU VIRUS 1: NOT DETECTED
HUMAN PARAINFLU VIRUS 2: NOT DETECTED
HUMAN PARAINFLU VIRUS 3: NOT DETECTED
INFLUENZA A SUBTYPE H1: NOT DETECTED
INFLUENZA A SUBTYPE H3: DETECTED — AB
Influenza A: DETECTED — AB
Influenza B: NOT DETECTED
Metapneumovirus: NOT DETECTED
Respiratory Syncytial Virus A: NOT DETECTED
Respiratory Syncytial Virus B: NOT DETECTED
Rhinovirus: NOT DETECTED

## 2023-09-17 ENCOUNTER — Encounter: Payer: Self-pay | Admitting: Neurology

## 2023-09-17 ENCOUNTER — Ambulatory Visit: Admitting: Family Medicine

## 2023-09-17 ENCOUNTER — Other Ambulatory Visit: Payer: Self-pay | Admitting: Family Medicine

## 2023-09-17 ENCOUNTER — Encounter: Payer: Self-pay | Admitting: Nurse Practitioner

## 2023-09-17 ENCOUNTER — Encounter: Payer: Self-pay | Admitting: Internal Medicine

## 2023-09-17 ENCOUNTER — Encounter: Payer: Self-pay | Admitting: Family Medicine

## 2023-09-17 ENCOUNTER — Ambulatory Visit: Admitting: Internal Medicine

## 2023-09-17 ENCOUNTER — Other Ambulatory Visit: Payer: Self-pay | Admitting: Neurology

## 2023-09-17 VITALS — BP 118/76 | HR 85 | Temp 99.0°F | Ht 69.0 in | Wt 184.4 lb

## 2023-09-17 DIAGNOSIS — J101 Influenza due to other identified influenza virus with other respiratory manifestations: Secondary | ICD-10-CM | POA: Diagnosis not present

## 2023-09-17 DIAGNOSIS — G35 Multiple sclerosis: Secondary | ICD-10-CM | POA: Diagnosis not present

## 2023-09-17 DIAGNOSIS — F902 Attention-deficit hyperactivity disorder, combined type: Secondary | ICD-10-CM

## 2023-09-17 HISTORY — DX: Influenza due to other identified influenza virus with other respiratory manifestations: J10.1

## 2023-09-17 MED ORDER — AMPHETAMINE-DEXTROAMPHETAMINE 10 MG PO TABS: 10.0000 mg | ORAL_TABLET | Freq: Every day | ORAL | 0 refills | Status: DC

## 2023-09-17 NOTE — Patient Instructions (Signed)
-  It was a pleasure meeting you today -Symptoms are currently however secondary to influenza A infection -The treatment for this is mainly conservative including rest and hydration as well as oral gargling for your sore throat -I am concerned about the double vision.  Please contact your neurologist given your history of MS to ensure that this is not the beginning of an MS flare -Please contact us if your symptoms are not improving or if they worsen acutely

## 2023-09-17 NOTE — Assessment & Plan Note (Signed)
 Symptoms started with a sore throat, progressed to a dry cough with some minor productive coughing. Noted irritation and a burning sensation in the upper chest, and difficulty swallowing due to sorethroat. Recent exposure to multiple sick individuals. Some rales noted on examination. -Start Prednisone. -Start Amoxicillin (due to recent use of Azithromycin). -Continue DayQuil. -Start Tessalon Perles for cough. -Check for RSV with a send-out swab. -If symptoms worsen or difficulty breathing develops, patient advised to go to the ER.

## 2023-09-17 NOTE — Assessment & Plan Note (Signed)
-  Patient has a history of multiple sclerosis for which she follows up with neurology -He does complain of visual symptoms beginning yesterday (had double vision yesterday and blurry vision today) -I am concerned for the possibility of an MS flare versus for flu exacerbating his underlying symptoms -I have asked him to call his neurologist and follow-up with him to ensure that it is not the beginning of an MS flare -Continue with chronic medications for now -No further workup at this time

## 2023-09-17 NOTE — Assessment & Plan Note (Signed)
-  Patient was diagnosed with influenza A on respiratory viral panel after presenting with cough, malaise and sore throat last week (Friday) -He was initially prescribed prednisone and amoxicillin while awaiting respiratory viral panel -He states that his symptoms have not improved nor have they worsened -On exam, his lungs are clear to auscultation.  No exudate noted on oral exam.  No cervical lymphadenopathy noted. -Explained to patient that the prednisone amoxicillin are likely unhelpful in influenza and he can stop these medications. -Treatment is symptomatic management for now.  Will continue with guaifenesin/dextromethorphan as needed as well as DayQuil/NyQuil as needed -No further workup at this time -Return precautions given to the patient

## 2023-09-17 NOTE — Telephone Encounter (Signed)
 This Patient and wife are scheduled to see Dr. Heide Spark today at 11:00.

## 2023-09-17 NOTE — Progress Notes (Signed)
 Acute Office Visit  Subjective:     Patient ID: Damon Blair, male    DOB: 07/16/1991, 33 y.o.   MRN: [id number]  Chief Complaint  Patient presents with   Cough    Cough after having Flu A    Cough Associated symptoms include a sore throat. Pertinent negatives include no chills, fever, headaches, hemoptysis, shortness of breath or wheezing.   Patient is in today for follow-up of his influenza A.  Patient was seen in the clinic on Friday and was diagnosed with a viral URI and was treated with amoxicillin and prednisone as well as dextromethorphan/guaifenesin for his cough.  Patient states that his symptoms began with a sore throat and malaise as well as a dry cough.  Patient states that he also has some associated nasal congestion.  His symptoms have been persistent since last Tuesday with no improvement but no worsening.  Patient states that his cough is now minimally productive of yellowish phlegm.  No fevers or chills noted (temp today was 99 F).    Patient does have a history of MS and he did have a new symptom of double vision beginning yesterday.  He states he still has some blurry vision today but slightly improved from yesterday.  Review of Systems  Constitutional:  Positive for malaise/fatigue. Negative for chills and fever.  HENT:  Positive for congestion and sore throat. Negative for sinus pain.   Eyes:  Positive for blurred vision.  Respiratory:  Positive for cough and sputum production. Negative for hemoptysis, shortness of breath and wheezing.   Cardiovascular: Negative.   Neurological:  Negative for headaches.        Objective:    BP 118/76   Pulse 85   Temp 99 F (37.2 C)   Ht 5\' 9"  (1.753 m)   Wt 184 lb 6.4 oz (83.6 kg)   SpO2 98%   BMI 27.23 kg/m    Physical Exam Constitutional:      Appearance: Normal appearance.  HENT:     Head: Normocephalic and atraumatic.     Mouth/Throat:     Pharynx: Oropharynx is clear. No oropharyngeal exudate.   Cardiovascular:     Rate and Rhythm: Normal rate and regular rhythm.     Heart sounds: Normal heart sounds.  Pulmonary:     Effort: Pulmonary effort is normal. No respiratory distress.     Breath sounds: Normal breath sounds. No wheezing, rhonchi or rales.  Musculoskeletal:     Cervical back: Neck supple.  Lymphadenopathy:     Cervical: No cervical adenopathy.  Neurological:     Mental Status: He is alert and oriented to person, place, and time.     Comments: Patient does complain of blurry vision and on testing complained of halo around my fingers with his right eye as well as blurring with his left eye  Psychiatric:        Mood and Affect: Mood normal.        Behavior: Behavior normal.     No results found for any visits on 09/17/23.      Assessment & Plan:   Problem List Items Addressed This Visit       Respiratory   Influenza A - Primary   -Patient was diagnosed with influenza A on respiratory viral panel after presenting with cough, malaise and sore throat last week (Friday) -He was initially prescribed prednisone and amoxicillin while awaiting respiratory viral panel -He states that his symptoms have not improved nor have  they worsened -On exam, his lungs are clear to auscultation.  No exudate noted on oral exam.  No cervical lymphadenopathy noted. -Explained to patient that the prednisone amoxicillin are likely unhelpful in influenza and he can stop these medications. -Treatment is symptomatic management for now.  Will continue with guaifenesin/dextromethorphan as needed as well as DayQuil/NyQuil as needed -No further workup at this time -Return precautions given to the patient        Nervous and Auditory   MS (multiple sclerosis) (HCC)   -Patient has a history of multiple sclerosis for which she follows up with neurology -He does complain of visual symptoms beginning yesterday (had double vision yesterday and blurry vision today) -I am concerned for the  possibility of an MS flare versus for flu exacerbating his underlying symptoms -I have asked him to call his neurologist and follow-up with him to ensure that it is not the beginning of an MS flare -Continue with chronic medications for now -No further workup at this time       No orders of the defined types were placed in this encounter.   No follow-ups on file.  Earl Lagos, MD

## 2023-09-17 NOTE — Progress Notes (Signed)
 Please call patient to check how is he doing? Patient tested positive for flu A and negative for RSV.

## 2023-09-18 MED ORDER — DRONABINOL 5 MG PO CAPS: ORAL_CAPSULE | ORAL | 0 refills | Status: DC

## 2023-09-18 NOTE — Telephone Encounter (Signed)
 Last seen 07/05/23 and next f/u 01/31/24. Last refilled 08/27/23 #60.

## 2023-10-06 ENCOUNTER — Other Ambulatory Visit: Payer: Self-pay | Admitting: Nurse Practitioner

## 2023-10-08 ENCOUNTER — Other Ambulatory Visit: Payer: Self-pay

## 2023-10-16 ENCOUNTER — Other Ambulatory Visit: Payer: Self-pay | Admitting: Family Medicine

## 2023-10-16 ENCOUNTER — Other Ambulatory Visit: Payer: Self-pay | Admitting: Neurology

## 2023-10-16 ENCOUNTER — Other Ambulatory Visit: Payer: Self-pay | Admitting: Internal Medicine

## 2023-10-16 DIAGNOSIS — F902 Attention-deficit hyperactivity disorder, combined type: Secondary | ICD-10-CM

## 2023-10-16 MED ORDER — BACLOFEN 20 MG PO TABS: 20.0000 mg | ORAL_TABLET | Freq: Three times a day (TID) | ORAL | 3 refills | Status: DC

## 2023-10-16 MED ORDER — AMPHETAMINE-DEXTROAMPHETAMINE 10 MG PO TABS: 10.0000 mg | ORAL_TABLET | Freq: Every day | ORAL | 0 refills | Status: DC

## 2023-10-16 MED ORDER — CYANOCOBALAMIN 1000 MCG/ML IJ SOLN: 1000.0000 ug | INTRAMUSCULAR | 11 refills | Status: AC

## 2023-10-16 NOTE — Telephone Encounter (Signed)
 Last seen on 07/05/23 Follow up scheduled on 01/31/24  Pt last filled on 09/24/23, #60 tablet (30 day supply)  he is not out of medication. He submitted the refill early so the pharmacy can have Rx in stock for him per out conversation via my chart in this encounter.

## 2023-10-16 NOTE — Telephone Encounter (Signed)
 [facility]oke with [facility]ist to confirm pt's instructions on prescription. Pharmacist verbalized understanding and he will get Rx ready for pick up for patient.

## 2023-10-18 MED ORDER — DRONABINOL 5 MG PO CAPS: ORAL_CAPSULE | ORAL | 0 refills | Status: DC

## 2023-10-30 NOTE — Telephone Encounter (Signed)
 Pt reports that the dronabinol (MARINOL) 5 MG capsule is on back order, the pharmacy has recently informed him that the medication is available in 10 mg, he is asking if a 30 day can be called into the  [id number] [place]

## 2023-11-01 ENCOUNTER — Telehealth: Payer: Self-pay | Admitting: Neurology

## 2023-11-01 NOTE — Telephone Encounter (Addendum)
 Spoke w/ Dr. Godwin Lat. Dr. Godwin Lat would prefer pt see if he can find alternate pharmacy to fill 5mg , not comfortable increasing dose to 10mg . [id number] [place], [place] - [address]. Last filled rx 09/24/23 #60.  [facility]ed [facility] [place], [place] [address] [place], [place], [place] [address] at [phone]. Spoke w/ tech. They have qty 60 in stock. Called pt back at [phone].  LVM.   Phone room: if pt calls, please let him know Dr. Godwin Lat does not want to increase dose but we found med in stock at [facility] [place], [place] [facility] and Dr. Godwin Lat will send prescription here instead.  Called [facility] [place], [place] and spoke w/ tech, Magazine features editor. Cx rx sent 10/18/23 so pt can fill at other pharmacy.

## 2023-11-01 NOTE — Telephone Encounter (Signed)
 Pt called stating that his pharmacy is out of the dronabinol (MARINOL) 5 MG capsule but have the 10mg . He was advised to ask if the provider would consider increasing the dosage.

## 2023-11-05 MED ORDER — DRONABINOL 5 MG PO CAPS: ORAL_CAPSULE | ORAL | 0 refills | Status: DC

## 2023-11-05 NOTE — Telephone Encounter (Signed)
 Pt called back and said he is fine with the Rx being [facility] to the [facility] [place] Damon Blair.

## 2023-11-09 ENCOUNTER — Telehealth: Payer: Self-pay | Admitting: Student in an Organized Health Care Education/Training Program

## 2023-11-09 NOTE — Telephone Encounter (Signed)
 Patient lvmail asking if you received the PT  records so he can get his TESI?

## 2023-11-12 ENCOUNTER — Ambulatory Visit

## 2023-11-12 VITALS — BP 124/84 | HR 74 | Temp 98.3°F | Ht 69.0 in | Wt 194.0 lb

## 2023-11-12 DIAGNOSIS — F1110 Opioid abuse, uncomplicated: Secondary | ICD-10-CM

## 2023-11-12 DIAGNOSIS — F909 Attention-deficit hyperactivity disorder, unspecified type: Secondary | ICD-10-CM

## 2023-11-12 DIAGNOSIS — F902 Attention-deficit hyperactivity disorder, combined type: Secondary | ICD-10-CM

## 2023-11-12 DIAGNOSIS — E7800 Pure hypercholesterolemia, unspecified: Secondary | ICD-10-CM | POA: Diagnosis not present

## 2023-11-12 DIAGNOSIS — F419 Anxiety disorder, unspecified: Secondary | ICD-10-CM

## 2023-11-12 DIAGNOSIS — A630 Anogenital (venereal) warts: Secondary | ICD-10-CM | POA: Diagnosis not present

## 2023-11-12 DIAGNOSIS — G35 Multiple sclerosis: Secondary | ICD-10-CM

## 2023-11-12 LAB — LIPID PANEL
Cholesterol: 213 mg/dL — ABNORMAL HIGH (ref 0–200)
HDL: 49.4 mg/dL (ref >39.00)
LDL Cholesterol: 137 mg/dL — ABNORMAL HIGH (ref 0–99)
NonHDL: 163.6
Total CHOL/HDL Ratio: 4
Triglycerides: 134 mg/dL (ref 0.0–149.0)
VLDL: 26.8 mg/dL (ref 0.0–40.0)

## 2023-11-12 LAB — COMPREHENSIVE METABOLIC PANEL WITH GFR
ALT: 15 U/L (ref 0–53)
AST: 16 U/L (ref 0–37)
Albumin: 4.6 g/dL (ref 3.5–5.2)
Alkaline Phosphatase: 36 U/L — ABNORMAL LOW (ref 39–117)
BUN: 14 mg/dL (ref 6–23)
CO2: 29 meq/L (ref 19–32)
Calcium: 9.7 mg/dL (ref 8.4–10.5)
Chloride: 108 meq/L (ref 96–112)
Creatinine, Ser: 0.76 mg/dL (ref 0.40–1.50)
GFR: 119.06 mL/min (ref >60.00)
Glucose, Bld: 91 mg/dL (ref 70–99)
Potassium: 4.9 meq/L (ref 3.5–5.1)
Sodium: 142 meq/L (ref 135–145)
Total Bilirubin: 0.3 mg/dL (ref 0.2–1.2)
Total Protein: 6.9 g/dL (ref 6.0–8.3)

## 2023-11-12 MED ORDER — AMPHETAMINE-DEXTROAMPHETAMINE 10 MG PO TABS: 10.0000 mg | ORAL_TABLET | Freq: Every day | ORAL | 0 refills | Status: DC

## 2023-11-12 MED ORDER — IMIQUIMOD 5 % EX CREA: TOPICAL_CREAM | CUTANEOUS | 0 refills | Status: AC

## 2023-11-12 NOTE — Assessment & Plan Note (Signed)
 Check CMP, lipid panel today. If LDL within goal can stay off Crestor , if LDL above goal we will recommend starting on every other day 5 mg Crestor . Continue regular moderate intensity exercise, healthy eating.

## 2023-11-12 NOTE — Assessment & Plan Note (Signed)
-   Recommend imiquimod 5 % cream, three times per week at bed time, wash off after 6-10 hours. Use for 16 weeeks. Follow up with us  or urology recommended.

## 2023-11-12 NOTE — Assessment & Plan Note (Signed)
>>  ASSESSMENT AND PLAN FOR ATTENTION DEFICIT HYPERACTIVITY DISORDER (ADHD) WRITTEN ON 11/12/2023  9:02 AM BY Tymel Conely, MD  Patient reports he has been on Aderall since he was in 5th grade. No neuropsych evaluation on file. Referral to behavioral health today to schedule for neuro psych evaluation done today. PDMP reviewed, last refill on Adderall was on 10/21/23. One month refill starting on refill date of 11/20/23 was done.

## 2023-11-12 NOTE — Assessment & Plan Note (Signed)
 Management per neurology. Currently not using recreational substances. I do recommend all medications for MS to come from his neurologist including Glatopa , Marinol , Diazepam , Baclofen , Naproxen . Patient agreeable.

## 2023-11-12 NOTE — Assessment & Plan Note (Signed)
 Currently not using recreational substances. I do recommend all medications for MS to come from his neurologist including Glatopa , Marinol , Diazepam , Baclofen , Naproxen . Patient agreeable.

## 2023-11-12 NOTE — Assessment & Plan Note (Signed)
 Patient reports he has been on Aderall since he was in 5th grade. No neuropsych evaluation on file. Referral to behavioral health today to schedule for neuro psych evaluation done today. PDMP reviewed, last refill on Adderall was on 10/21/23. One month refill starting on refill date of 11/20/23 was done.

## 2023-11-12 NOTE — Progress Notes (Signed)
 Established Patient Office Visit   Subjective  Patient ID: Damon Blair, male    DOB: 1991/02/28  Age: 33 y.o. MRN: [id number]  Chief Complaint  Patient presents with   Transitions Of Care    He  has a past medical history of Anxiety, Arthritis, Kidney stones, MS (multiple sclerosis) (HCC), and Neuromuscular disorder (HCC). Established patient of Dr. Sueanne Emerald presenting for transfer of care.   HPI 1)Multiple sclerosis: Diagnosed in 2020. Sees neurologist Dr. Godwin Lat for MS. Currently is taking On Glatopa , Marinol , Diazepam . He is also taking Gabapentin  800 mg, twice a day (last refill per patient came from ER doctor). He is also taking Baclofen  20 mg 2 tablets in the morning and 1 tab at bed time ( last refill for this came from Dr. Sueanne Emerald). He takes Tylenol  650 mg in AM and 640 MG in the PM and Naproxen  375 mg for pain management.  Other MS medications coming from his neurologist. Has f/u appointment with neurology in July.    2) Patient reports he has been on Adderall since he was in 5th grade for ADHD. He is currently taking 10 mg Adderall once every morning. Patient also reports he is on drug holidays during weekends. He used to see a psychiatrist in the past but has not seen a psychologist or psychiatrist now. Last refill seny on 10/16/23 by Dr. Sueanne Emerald. He has not have neuro psych evaluation done for ADHD. Patient also states he has a h/o anxiety. Looking for a counselor.   3) LDL elevated at 123 on 07/16/2023. He was taking Crestor  5 mg every other day. He is due to recheck cholesterol today. Patient reports he ate colored greens, cauliflower, garlic, fruit juice and frozen fruit. He has been off Crestor  since December of last year.   4)H/O hep C rx with Epclusa. He was treated with this in 2019.   5) Double ureters of left kidney, left renal stone in 2023.  He is not following up with urology at this time. He reports of some left flank discomfort. He is also stating of painful urination  that started about 3 weeks ago. Has not f/u with urology.   6) Last A1c 07/16/23: 5.5%, normal. Exercising regularly. Eating healthy.  lipid panel 07/16/23 123 alc, exercise. He has been off Crestor  since December.   7) Genital wart: Patient has a h/o genital warts, he was treating it with This was prescribed by his PCP in the past. He reports last treatment was around beginning of this year and has reported new spots.    8) Arthritis of hands: Stiffness, comes and goes. Reports has family h/o arthritis. Concerned that the stiffness may be related to MS symptoms.   9) H/O recreational substance use: Has been 6 months free of substance use.  Patient reports he restarted using dilaudid  s/p renal stone in 2023. He reports now he is going through [employer]. He is not going to NA now.  Last use of street substance was in 2018, has used heroine and other IV drug use. Has gone to [facility] spine clinic.   10) Seasonal allergy on Zyrtec  daily with symptom control.   ROS As per HPI    Objective:     BP 124/84   Pulse 74   Temp 98.3 F (36.8 C) (Oral)   Wt 194 lb (88 kg)   SpO2 99%   BMI 28.65 kg/m      11/12/2023    8:11 AM 09/17/2023   11:24 AM  09/14/2023    9:42 AM  Depression screen PHQ 2/9  Decreased Interest 1 0 0  Down, Depressed, Hopeless 0 0 0  PHQ - 2 Score 1 0 0  Altered sleeping 1 0 1  Tired, decreased energy 2 0 2  Change in appetite 0 0 0  Feeling bad or failure about yourself  0 0 0  Trouble concentrating 1 0 1  Moving slowly or fidgety/restless 1 0 1  Suicidal thoughts 0 0 0  PHQ-9 Score 6 0 5  Difficult doing work/chores Somewhat difficult Not difficult at all Somewhat difficult      11/12/2023    8:12 AM 09/17/2023   11:24 AM 09/14/2023    9:42 AM 07/12/2023    1:53 PM  GAD 7 : Generalized Anxiety Score  Nervous, Anxious, on Edge 1 0 2 3  Control/stop worrying 1 0 1 2  Worry too much - different things 1 0 1 2  Trouble relaxing 1 0 2 3  Restless 1 0 1 2  Easily  annoyed or irritable 1 0 1 3  Afraid - awful might happen 0 0 1 1  Total GAD 7 Score 6 0 9 16  Anxiety Difficulty Somewhat difficult Not difficult at all Somewhat difficult Very difficult    Physical Exam Constitutional:      Appearance: He is normal weight.  HENT:     Head: Normocephalic and atraumatic.     Right Ear: Tympanic membrane normal.     Left Ear: Tympanic membrane normal.     Mouth/Throat:     Mouth: Mucous membranes are moist.  Eyes:     Pupils: Pupils are equal, round, and reactive to light.  Cardiovascular:     Rate and Rhythm: Normal rate.     Pulses: Normal pulses.  Pulmonary:     Effort: Pulmonary effort is normal.     Breath sounds: Normal breath sounds.  Abdominal:     General: Bowel sounds are normal. There is no distension.     Palpations: Abdomen is soft.     Tenderness: There is no right CVA tenderness, left CVA tenderness or guarding.  Musculoskeletal:     Cervical back: Normal range of motion. No rigidity.     Right lower leg: No edema.     Left lower leg: No edema.  Skin:    General: Skin is warm.  Neurological:     Mental Status: He is alert and oriented to person, place, and time.  Psychiatric:        Mood and Affect: Mood normal.        No results found for any visits on 11/12/23.  The ASCVD Risk score (Arnett DK, et al., 2019) failed to calculate for the following reasons:   The 2019 ASCVD risk score is only valid for ages 79 to 30    Assessment & Plan:  Attention deficit hyperactivity disorder (ADHD), unspecified ADHD type Assessment & Plan: Patient reports he has been on Aderall since he was in 5th grade. No neuropsych evaluation on file. Referral to behavioral health today to schedule for neuro psych evaluation done today. PDMP reviewed, last refill on Adderall was on 10/21/23. One month refill starting on refill date of 11/20/23 was done.   Orders: -     Ambulatory referral to Psychology  Elevated LDL cholesterol  level Assessment & Plan: Check CMP, lipid panel today. If LDL within goal can stay off Crestor , if LDL above goal we will recommend starting on  every other day 5 mg Crestor . Continue regular moderate intensity exercise, healthy eating.   Orders: -     Lipid panel -     Comprehensive metabolic panel with GFR  Genital warts Assessment & Plan:  - Recommend imiquimod 5 % cream, three times per week at bed time, wash off after 6-10 hours. Use for 16 weeeks. Follow up with us  or urology recommended.   Orders: -     Imiquimod; Apply topically 3 (three) times a week.  Dispense: 12 each; Refill: 0  Anxiety Assessment & Plan: Not on medication. Requesting referral to behavioral health department for counseling. Referral made.  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 I do recommend all medications for MS to come from his neurologist including Glatopa , Marinol , Diazepam , Baclofen , Naproxen . Patient agreeable.    MS (multiple sclerosis) (HCC) Assessment & Plan: Management per neurology. Currently not using recreational substances. I do recommend all medications for MS to come from his neurologist including Glatopa , Marinol , Diazepam , Baclofen , Naproxen . Patient agreeable.    ADHD (attention deficit hyperactivity disorder), combined type -     Amphetamine -Dextroamphetamine ; Take 1 tablet (10 mg total) by mouth daily.  Dispense: 30 tablet; Refill: 0    Return in about 3 months (around 02/11/2024) for Chronic follow up.   Jacklin Mascot, MD

## 2023-11-12 NOTE — Assessment & Plan Note (Signed)
 Not on medication. Requesting referral to behavioral health department for counseling. Referral made.

## 2023-11-12 NOTE — Patient Instructions (Addendum)
--   We are going to check your liver function, electrolytes, kidney function and cholesterol level.  -- I will defer all MS related medication to your neurologist including Baclofen , Gabapentin , Tylenol , Naproxen . -- I am sending you to neuropsych evaluation for ADHD evaluation to see what kind of ADHD you have. They will call you to schedule an appointment. If you do not head from them within the next 2 weeks please reach out to us .  -- Please reach out to your urologist to schedule a follow up appointment. If you do not get to see them or need a new referral please let us  know.

## 2023-11-13 ENCOUNTER — Other Ambulatory Visit: Payer: Self-pay | Admitting: Student in an Organized Health Care Education/Training Program

## 2023-11-13 DIAGNOSIS — M5414 Radiculopathy, thoracic region: Secondary | ICD-10-CM

## 2023-11-13 DIAGNOSIS — M5124 Other intervertebral disc displacement, thoracic region: Secondary | ICD-10-CM

## 2023-11-15 ENCOUNTER — Other Ambulatory Visit: Payer: Self-pay

## 2023-11-15 ENCOUNTER — Other Ambulatory Visit: Payer: Self-pay | Admitting: Neurology

## 2023-11-15 DIAGNOSIS — F902 Attention-deficit hyperactivity disorder, combined type: Secondary | ICD-10-CM

## 2023-11-15 NOTE — Telephone Encounter (Signed)
 Last refill sent on 11/12/23. Declining this refill as he is not due.   Jacklin Mascot, MD

## 2023-11-15 NOTE — Telephone Encounter (Signed)
 Last seen on 07/05/23 Follow up scheduled on 01/31/24    Dispensed Days Supply Quantity Provider Pharmacy  DIAZEPAM  5 MG TABLET 10/22/2023 30 30 each Sater, Sherida Dimmer, MD [facility] [phone] [facility] - ...      Rx pending to be signed

## 2023-11-19 ENCOUNTER — Ambulatory Visit
Admission: RE | Admit: 2023-11-19 | Discharge: 2023-11-19 | Disposition: A | Discharge status: Home / Self Care | Source: Ambulatory Visit | Attending: Student in an Organized Health Care Education/Training Program | Admitting: Student in an Organized Health Care Education/Training Program

## 2023-11-19 ENCOUNTER — Ambulatory Visit
Attending: Student in an Organized Health Care Education/Training Program | Admitting: Student in an Organized Health Care Education/Training Program

## 2023-11-19 ENCOUNTER — Encounter: Payer: Self-pay | Admitting: Student in an Organized Health Care Education/Training Program

## 2023-11-19 VITALS — BP 123/78 | HR 77 | Temp 99.0°F | Resp 18 | Ht 69.0 in | Wt 190.0 lb

## 2023-11-19 DIAGNOSIS — M5124 Other intervertebral disc displacement, thoracic region: Secondary | ICD-10-CM | POA: Diagnosis present

## 2023-11-19 DIAGNOSIS — M5414 Radiculopathy, thoracic region: Secondary | ICD-10-CM | POA: Insufficient documentation

## 2023-11-19 MED ORDER — ROPIVACAINE HCL 2 MG/ML IJ SOLN
2.0000 mL | Freq: Once | INTRAMUSCULAR | Status: AC
  Administered 2023-11-19: 2 mL via EPIDURAL
  Filled 2023-11-19: qty 20

## 2023-11-19 MED ORDER — SODIUM CHLORIDE 0.9% FLUSH
2.0000 mL | Freq: Once | INTRAVENOUS | Status: AC
  Administered 2023-11-19: 2 mL

## 2023-11-19 MED ORDER — DEXAMETHASONE SODIUM PHOSPHATE 10 MG/ML IJ SOLN
10.0000 mg | Freq: Once | INTRAMUSCULAR | Status: AC
  Administered 2023-11-19: 10 mg
  Filled 2023-11-19: qty 1

## 2023-11-19 MED ORDER — IOHEXOL 180 MG/ML  SOLN
10.0000 mL | Freq: Once | INTRAMUSCULAR | Status: AC
  Administered 2023-11-19: 10 mL via EPIDURAL
  Filled 2023-11-19: qty 20

## 2023-11-19 MED ORDER — DIAZEPAM 5 MG PO TABS
5.0000 mg | ORAL_TABLET | ORAL | Status: AC
  Administered 2023-11-19: 5 mg via ORAL

## 2023-11-19 MED ORDER — LIDOCAINE HCL 2 % IJ SOLN
20.0000 mL | Freq: Once | INTRAMUSCULAR | Status: AC
  Administered 2023-11-19: 100 mg
  Filled 2023-11-19: qty 40

## 2023-11-19 NOTE — Patient Instructions (Signed)

## 2023-11-19 NOTE — Progress Notes (Signed)
 PROVIDER NOTE: Interpretation of information contained herein should be left to medically-trained personnel. Specific patient instructions are provided elsewhere under "Patient Instructions" section of medical record. This document was created in part using STT-dictation technology, any transcriptional errors that may result from this process are unintentional.  Patient: Damon Blair Type: Established DOB: [birth year] MRN: [id number] PCP: Jacklin Mascot, MD  Service: Procedure DOS: 11/19/2023 Setting: Ambulatory Location: Ambulatory outpatient facility Delivery: Face-to-face Provider: Cephus Collin, MD Specialty: Interventional Pain Management Specialty designation: 09 Location: Outpatient facility Ref. Prov.: Bair, Kalpana, MD       Interventional Therapy   Primary Reason for Visit: Interventional Pain Management Treatment. CC: Back Pain (Mid back to neck)  Procedure:           Inter-Laminar Thoracic Epidural Steroid Block/Injection  #1  Laterality:  Right Level: T7-8  Imaging: Fluoroscopic guidance Anesthesia: Local anesthesia (1-2% Lidocaine ) Anxiolysis:  minimal PO Valium  Sedation: None.  DOS: 11/19/2023 Performed by: Cephus Collin, MD  Purpose: Diagnostic/Therapeutic Indications: Thoracic back pain, radicular pain, with degenerative disc disease severe enough to impact quality of life or function. 1. Thoracic disc herniation   2. Radicular pain of thoracic region    NAS-11 Pain score:   Pre-procedure: 7 /10   Post-procedure: 7 /10     Position / Prep / Materials:  Position: Prone  Prep solution: ChloraPrep (2% chlorhexidine  gluconate and 70% isopropyl alcohol) Prep Area: Posterior Thoracolumbar (Upper back from shoulders to lower lumbar region).  Materials:  Tray: Epidural Needle(s) Type: Epidural needle Gauge (G): 17 Length: Regular (3.5-in) Qty: 1 H&P (Pre-op Assessment):  Mr. Melancon is a 33 y.o. (year old), male patient, seen today for interventional  treatment. He  has a past surgical history that includes Fracture surgery (Left); Cystoscopy/ureteroscopy/holmium laser/stent placement (Left, 11/18/2021); and Cystoscopy w/ retrogrades (11/18/2021). Mr. Gibby has a current medication list which includes the following prescription(s): acetaminophen , amphetamine -dextroamphetamine , baclofen , cetirizine , vitamin d -3, cyanocobalamin , [START ON 11/21/2023] diazepam , dronabinol , gabapentin , glatopa , imiquimod , naproxen , and fish oil. His primarily concern today is the Back Pain (Mid back to neck)  Initial Vital Signs:  Pulse/HCG Rate: 77ECG Heart Rate: 75 Temp: 99 F (37.2 C) Resp: 18 BP: 134/81 SpO2: 100 %  BMI: Estimated body mass index is 28.06 kg/m as calculated from the following:   Height as of this encounter: 5\' 9"  (1.753 m).   Weight as of this encounter: 190 lb (86.2 kg).  Risk Assessment: Allergies: Reviewed. He is allergic to clavulanic acid, dilaudid  [hydromorphone ], oxycodone -acetaminophen , and oxycodone .  Allergy Precautions: None required Coagulopathies: Reviewed. None identified.  Blood-thinner therapy: None at this time Active Infection(s): Reviewed. None identified. Mr. Lanman is afebrile  Site Confirmation: Mr. Mahoney was asked to confirm the procedure and laterality before marking the site Procedure checklist: Completed Consent: Before the procedure and under the influence of no sedative(s), amnesic(s), or anxiolytics, the patient was informed of the treatment options, risks and possible complications. To fulfill our ethical and legal obligations, as recommended by the American Medical Association's Code of Ethics, I have informed the patient of my clinical impression; the nature and purpose of the treatment or procedure; the risks, benefits, and possible complications of the intervention; the alternatives, including doing nothing; the risk(s) and benefit(s) of the alternative treatment(s) or procedure(s); and  the risk(s) and benefit(s) of doing nothing. The patient was provided information about the general risks and possible complications associated with the procedure. These may include, but are not limited to: failure to achieve desired goals, infection, bleeding, organ or  nerve damage, allergic reactions, paralysis, and death. In addition, the patient was informed of those risks and complications associated to Spine-related procedures, such as failure to decrease pain; infection (i.e.: Meningitis, epidural or intraspinal abscess); bleeding (i.e.: epidural hematoma, subarachnoid hemorrhage, or any other type of intraspinal or peri-dural bleeding); organ or nerve damage (i.e.: Any type of peripheral nerve, nerve root, or spinal cord injury) with subsequent damage to sensory, motor, and/or autonomic systems, resulting in permanent pain, numbness, and/or weakness of one or several areas of the body; allergic reactions; (i.e.: anaphylactic reaction); and/or death. Furthermore, the patient was informed of those risks and complications associated with the medications. These include, but are not limited to: allergic reactions (i.e.: anaphylactic or anaphylactoid reaction(s)); adrenal axis suppression; blood sugar elevation that in diabetics may result in ketoacidosis or comma; water retention that in patients with history of congestive heart failure may result in shortness of breath, pulmonary edema, and decompensation with resultant heart failure; weight gain; swelling or edema; medication-induced neural toxicity; particulate matter embolism and blood vessel occlusion with resultant organ, and/or nervous system infarction; and/or aseptic necrosis of one or more joints. Finally, the patient was informed that Medicine is not an exact science; therefore, there is also the possibility of unforeseen or unpredictable risks and/or possible complications that may result in a catastrophic outcome. The patient indicated having  understood very clearly. We have given the patient no guarantees and we have made no promises. Enough time was given to the patient to ask questions, all of which were answered to the patient's satisfaction. Mr. Fralix has indicated that he wanted to continue with the procedure. Attestation: I, the ordering provider, attest that I have discussed with the patient the benefits, risks, side-effects, alternatives, likelihood of achieving goals, and potential problems during recovery for the procedure that I have provided informed consent. Date  Time: 11/19/2023  9:53 AM  Pre-Procedure Preparation:  Monitoring: As per clinic protocol. Respiration, ETCO2, SpO2, BP, heart rate and rhythm monitor placed and checked for adequate function Safety Precautions: Patient was assessed for positional comfort and pressure points before starting the procedure. Time-out: I initiated and conducted the "Time-out" before starting the procedure, as per protocol. The patient was asked to participate by confirming the accuracy of the "Time Out" information. Verification of the correct person, site, and procedure were performed and confirmed by me, the nursing staff, and the patient. "Time-out" conducted as per Joint Commission's Universal Protocol (UP.01.01.01). Time: 1035 Start Time: 1035 hrs.  Description of Procedure:          Target Area: For Epidural Steroid injection(s), the target area is the  interlaminar space, initially targeting the lower border of the superior vertebral body lamina. Approach: Interlaminar approach. Area Prepped: Entire Posterior Thoracolumbar Region ChloraPrep (2% chlorhexidine  gluconate and 70% isopropyl alcohol) Safety Precautions: Aspiration looking for blood return was conducted prior to all injections. At no point did we inject any substances, as a needle was being advanced. No attempts were made at seeking any paresthesias. Safe injection practices and needle disposal techniques used.  Medications properly checked for expiration dates. SDV (single dose vial) medications used. Description of the Procedure: Protocol guidelines were followed. The patient was placed in position over the fluoroscopy table. The target area was identified and the area prepped in the usual manner. Skin & deeper tissues infiltrated with local anesthetic. Appropriate amount of time allowed to pass for local anesthetics to take effect. The procedure needles were then advanced to the target area. The  inferior aspect of the superior lamina was contacted and the needle walked caudad, until the lamina was cleared. The epidural space was identified using "loss-of-resistance technique" with 0.9% PF-NSS (2-85mL), in a low friction 10cc LOR glass syringe. Proper needle placement was secured. Negative aspiration confirmed. Solution injected in intermittent fashion, asking for systemic symptoms every 0.5 cc of injectate. The needles were then removed and the area cleansed, making sure to leave some of the prepping solution behind to take advantage of its long term bactericidal properties. Vitals:   11/19/23 0958 11/19/23 1035 11/19/23 1040 11/19/23 1044  BP: 134/81 126/78 124/82 123/78  Pulse: 77     Resp:  18 17 18   Temp: 99 F (37.2 C)     SpO2: 100% 100% 99% 99%  Weight: 190 lb (86.2 kg)     Height: 5\' 9"  (1.753 m)       Start Time: 1035 hrs. End Time: 1044 hrs. Imaging Guidance (Spinal):          Type of Imaging Technique: Fluoroscopy Guidance (Spinal) Indication(s): Fluoroscopy guidance for needle placement to enhance accuracy in procedures requiring precise needle localization for targeted delivery of medication in or near specific anatomical locations not easily accessible without such real-time imaging assistance. Exposure Time: Please see nurses notes. Contrast: Before injecting any contrast, we confirmed that the patient did not have an allergy to iodine, shellfish, or radiological contrast. Once  satisfactory needle placement was completed at the desired level, radiological contrast was injected. Contrast injected under live fluoroscopy. No contrast complications. See chart for type and volume of contrast used. Fluoroscopic Guidance: I was personally present during the use of fluoroscopy. "Tunnel Vision Technique" used to obtain the best possible view of the target area. Parallax error corrected before commencing the procedure. "Direction-depth-direction" technique used to introduce the needle under continuous pulsed fluoroscopy. Once target was reached, antero-posterior, oblique, and lateral fluoroscopic projection used confirm needle placement in all planes. Images permanently stored in EMR. Interpretation: I personally interpreted the imaging intraoperatively. Adequate needle placement confirmed in multiple planes. Appropriate spread of contrast into desired area was observed. No evidence of afferent or efferent intravascular uptake. No intrathecal or subarachnoid spread observed. Permanent images saved into the patient's record.  Antibiotic Prophylaxis:   Anti-infectives (From admission, onward)    None      Indication(s): None identified  Post-operative Assessment:  Post-procedure Vital Signs:  Pulse/HCG Rate: 7770 Temp: 99 F (37.2 C) Resp: 18 BP: 123/78 SpO2: 99 %  EBL: None  Complications: No immediate post-treatment complications observed by team, or reported by patient.  Note: The patient tolerated the entire procedure well. A repeat set of vitals were taken after the procedure and the patient was kept under observation following institutional policy, for this type of procedure. Post-procedural neurological assessment was performed, showing return to baseline, prior to discharge. The patient was provided with post-procedure discharge instructions, including a section on how to identify potential problems. Should any problems arise concerning this procedure, the patient  was given instructions to immediately contact us , at any time, without hesitation. In any case, we plan to contact the patient by telephone for a follow-up status report regarding this interventional procedure.  Comments:  No additional relevant information.  Plan of Care (POC)  Orders:  Orders Placed This Encounter  Procedures   DG PAIN CLINIC C-ARM 1-60 MIN NO REPORT    Intraoperative interpretation by procedural physician at [facility].    Standing Status:   Standing  Number of Occurrences:   1    Reason for exam::   Assistance in needle guidance and placement for procedures requiring needle placement in or near specific anatomical locations not easily accessible without such assistance.    Medications ordered for procedure: Meds ordered this encounter  Medications   iohexol  (OMNIPAQUE ) 180 MG/ML injection 10 mL    Must be Myelogram-compatible. If not available, you may substitute with a water-soluble, non-ionic, hypoallergenic, myelogram-compatible radiological contrast medium.   lidocaine  (XYLOCAINE ) 2 % (with pres) injection 400 mg   diazepam  (VALIUM ) tablet 5 mg    Make sure Flumazenil is available in the pyxis when using this medication. If oversedation occurs, administer 0.2 mg IV over 15 sec. If after 45 sec no response, administer 0.2 mg again over 1 min; may repeat at 1 min intervals; not to exceed 4 doses (1 mg)   ropivacaine  (PF) 2 mg/mL (0.2%) (NAROPIN ) injection 2 mL   sodium chloride  flush (NS) 0.9 % injection 2 mL   dexamethasone  (DECADRON ) injection 10 mg   Medications administered: We administered iohexol , lidocaine , diazepam , ropivacaine  (PF) 2 mg/mL (0.2%), sodium chloride  flush, and dexamethasone .  See the medical record for exact dosing, route, and time of administration.  Follow-up plan:   Return in about 3 weeks (around 12/10/2023), or VV PPE.       Left C-ESI 07/16/23, Right Thoracic ESI 11/19/23     Recent Visits No visits were found  meeting these conditions. Showing recent visits within past 90 days and meeting all other requirements Today's Visits Date Type Provider Dept  11/19/23 Procedure visit Cephus Collin, MD [facility]  Showing today's visits and meeting all other requirements Future Appointments Date Type Provider Dept  12/13/23 Appointment Cephus Collin, MD [facility]  Showing future appointments within next 90 days and meeting all other requirements  Disposition: Discharge home  Discharge (Date  Time): 11/19/2023; 1055 hrs.   Primary Care Physician: Bair, Kalpana, MD Location: [facility] Note by: Cephus Collin, MD (TTS technology used. I apologize for any typographical errors that were not detected and corrected.) Date: 11/19/2023; Time: 10:49 AM  Disclaimer:  Medicine is not an Visual merchandiser. The only guarantee in medicine is that nothing is guaranteed. It is important to note that the decision to proceed with this intervention was based on the information collected from the patient. The Data and conclusions were drawn from the patient's questionnaire, the interview, and the physical examination. Because the information was provided in large part by the patient, it cannot be guaranteed that it has not been purposely or unconsciously manipulated. Every effort has been made to obtain as much relevant data as possible for this evaluation. It is important to note that the conclusions that lead to this procedure are derived in large part from the available data. Always take into account that the treatment will also be dependent on availability of resources and existing treatment guidelines, considered by other Pain Management Practitioners as being common knowledge and practice, at the time of the intervention. For Medico-Legal purposes, it is also important to point out that variation in procedural techniques and pharmacological choices are the acceptable norm. The  indications, contraindications, technique, and results of the above procedure should only be interpreted and judged by a Board-Certified Interventional Pain Specialist with extensive familiarity and expertise in the same exact procedure and technique.

## 2023-11-19 NOTE — Progress Notes (Signed)
 Safety precautions to be maintained throughout the outpatient stay will include: orient to surroundings, keep bed in low position, maintain call bell within reach at all times, provide assistance with transfer out of bed and ambulation.

## 2023-11-20 ENCOUNTER — Telehealth: Payer: Self-pay

## 2023-11-20 NOTE — Telephone Encounter (Signed)
 Post procedure follow up.  Patient states he is doing very good this morning.

## 2023-11-23 ENCOUNTER — Ambulatory Visit: Admitting: Physician Assistant

## 2023-11-25 ENCOUNTER — Encounter: Payer: Self-pay | Admitting: Neurology

## 2023-11-26 ENCOUNTER — Other Ambulatory Visit: Payer: Self-pay | Admitting: Neurology

## 2023-11-26 MED ORDER — BACLOFEN 20 MG PO TABS: ORAL_TABLET | ORAL | 3 refills | Status: DC

## 2023-12-03 ENCOUNTER — Ambulatory Visit: Admitting: Psychology

## 2023-12-03 DIAGNOSIS — F1910 Other psychoactive substance abuse, uncomplicated: Secondary | ICD-10-CM | POA: Diagnosis not present

## 2023-12-03 DIAGNOSIS — F064 Anxiety disorder due to known physiological condition: Secondary | ICD-10-CM

## 2023-12-03 DIAGNOSIS — F410 Panic disorder [episodic paroxysmal anxiety] without agoraphobia: Secondary | ICD-10-CM

## 2023-12-03 NOTE — Progress Notes (Signed)
 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), Substance Abuse (F19.10)  Guardian/Payee:  Self    Paperwork requested: No   Reason for Visit /Presenting Problem: Patient has numerous medical issues including MS and wants to get individual help in addition to AA  Mental Status Exam: Appearance:   Casual     Behavior:  Appropriate  Motor:  Normal  Speech/Language:   Normal Rate  Affect:  Appropriate  Mood:  normal  Thought process:  normal  Thought content:    WNL  Sensory/Perceptual disturbances:    WNL  Orientation:  oriented to person, place, and time/date  Attention:  Good  Concentration:  Good  Memory:  WNL  Fund of knowledge:   Good  Insight:    Good  Judgment:   Good  Impulse Control:  Good   Reported Symptoms:  Anxiety: Feeling nervous, anxious or on edge, not being able to stop or control worrying, trouble relaxing, being so restless that it is hard to sit still, becoming easily annoyed or irritable. Depression: little interest or pleasure in doing things,  trouble falling or staying asleep, or sleeping too much, feeling tired or having little energy,  trouble concentrating on things, such as reading the newspaper or watching television, moving or speaking so slowly that other people could have noticed, or the opposite, being so fidgety or restless that you have been moving around a lot more than usual.  Risk Assessment: Danger to Self:  No Self-injurious Behavior: No Danger to Others: No Duty to Warn:no Physical Aggression / Violence:No  Access to Firearms a concern: No , has a firearm and will secure it, does not plan on using it.  Gang Involvement:No  Patient / guardian was educated about steps to take if suicide or  homicide risk level increases between visits: yes While future psychiatric events cannot be accurately predicted, the patient does not currently require acute inpatient psychiatric care and does not currently meet [place]  involuntary commitment criteria.  Substance Abuse History: Current substance abuse: Yes , long history alcohol and substance abuse.  Caffeine: Tobacco: Alcohol: Substance use:  Past Psychiatric History:   Previous psychological history is significant for depression Outpatient Providers: [facility], [address], [place], [place] [address] History of Psych Hospitalization: Yes , Oct. 2024 for 5 days, and rehosptialized for steroid induced psychosis/MS related.  Psychological Testing: No   Abuse History:  Victim of: No. Report needed: No. Victim of Neglect:No. Perpetrator of No  Witness / Exposure to Domestic Violence: No   Protective Services Involvement: No  Witness to [employer] Violence:  No   Family History:  Family History  Problem Relation Age of Onset   Anxiety disorder Mother    Arthritis Mother    ADD / ADHD Father    Arthritis Father    Hearing loss Father    Hypertension Father    Early death Brother    Arthritis Maternal Grandmother    Asthma Maternal Grandmother     Living situation: the patient lives with their family wife Cay Cocking and 62 year old son Carolyn Cisco  Sexual Orientation: Straight  Relationship Status: married since 2019 5+ years Name of spouse / other: Ariel  If a parent, number of children / ages: 59  years old  Support Systems: spouse Landscape architect, [place], the [employer], church Christianity  Financial Stress:  Yes   Income/Employment/Disability: Employment, Teacher, 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 Has worked there 10-11 weeks, and he enjoys it.   Military Service: No   Educational History: Education: Production designer, theatre/television/film: Christianity  Any cultural differences that may affect / interfere with treatment:  not applicable   Recreation/Hobbies: gardening/fishing, and has a lot of outdoor interests  Stressors: Health problems  , being a good role model for son, maintain boundaries with biological family. Mother, step-father, and father are are alcoholics, and father are alcoholics  Strengths: Family-wife and son,   Barriers:  N/A   Legal History: Pending legal issue / charges: The patient has no significant history of legal issues. History of legal issue / charges: History of "trouble with the law".  Medical History/Surgical History: reviewed Past Medical History:  Diagnosis Date   Anxiety    Arthritis    Kidney stones    Left side   MS (multiple sclerosis) (HCC)    Neuromuscular disorder (HCC)     Past Surgical History:  Procedure Laterality Date   CYSTOSCOPY W/ RETROGRADES  11/18/2021   Procedure: CYSTOSCOPY WITH RETROGRADE PYELOGRAM;  Surgeon: Lawerence Pressman, MD;  Location: [facility];  Service: Urology;;   CYSTOSCOPY/URETEROSCOPY/HOLMIUM LASER/STENT PLACEMENT Left 11/18/2021   Procedure: CYSTOSCOPY/URETEROSCOPY/HOLMIUM LASER/STENT PLACEMENT;  Surgeon: Lawerence Pressman, MD;  Location: [facility];  Service: Urology;  Laterality: Left;   FRACTURE SURGERY Left    Fibula, left    Medications: Current Outpatient Medications  Medication Sig Dispense Refill   acetaminophen  (TYLENOL ) 325 MG tablet TAKE 2 TABLETS BY MOUTH EVERY 6 HOURS AS NEEDED. 90 tablet 1   amphetamine -dextroamphetamine  (ADDERALL) 10 MG tablet Take 1 tablet (10 mg total) by mouth daily. 30 tablet 0   baclofen  (LIORESAL ) 20 MG tablet Take 2 pills po qAM and 1 pill po qHS 270 each 3   cetirizine  (ZYRTEC ) 10 MG tablet TAKE 1 TABLET BY MOUTH EVERY DAY 90 tablet 1   Cholecalciferol (VITAMIN D -3) 25 MCG (1000 UT) CAPS Take by mouth.     cyanocobalamin  (VITAMIN B12) 1000 MCG/ML injection Inject  1 mL (1,000 mcg total) into the muscle every 30 (thirty) days. 1 mL 11   diazepam  (VALIUM ) 5 MG tablet TAKE 1 TABLET BY MOUTH EVERYDAY AT BEDTIME 30 tablet 3   dronabinol  (MARINOL ) 5 MG capsule One po bid 60 capsule 0   gabapentin  (NEURONTIN ) 800 MG tablet Take 1 tablet (800 mg total) by mouth 3 (three) times daily. 60 tablet 0   Glatiramer  Acetate (GLATOPA ) 40 MG/ML SOSY One ml subcu three times a week. 36 mL 4   imiquimod  (ALDARA ) 5 % cream Apply topically 3 (three) times a week. 12 each 0   naproxen  (NAPROSYN ) 375 MG tablet Take 1 tablet (375 mg total) by mouth 2 (two) times daily. 20 tablet 0   Omega-3 Fatty Acids (FISH OIL) 500 MG CAPS Take by mouth.     No current facility-administered medications for this visit.    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  PCP Dr. Emi Hanson Referral ADHD Sponsor-Jim new sponsor since one month  Plan of Care: OPT  Narrative:   Darlen Eglin participated from home, via video, is aware of tele-sessions limitations, and consented to treatment. Therapist participated from home office. We reviewed the limits of confidentiality prior to the start of the evaluation. Zakye Guggino-Boyd expressed understanding and agreement to proceed.   Patient is a 33 year old male who presented for an initial assessment. Patient reported the following symptoms: Anxiety: Feeling nervous, anxious or on edge, not being able to stop or control worrying, trouble relaxing, being so restless that it is hard to sit still, becoming easily annoyed or irritable. Depression: little interest or pleasure in doing things,  trouble falling or staying asleep, or sleeping too much,  feeling tired or having little energy,  trouble concentrating on things, such as reading the newspaper or watching television, moving or speaking so slowly that other people could have noticed or the opposite, being so fidgety or restless that you have been moving around a lot more than usual. Patient denied current and past suicidal ideation, homicidal ideation, and symptoms of psychosis. 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 Patient denied current alcohol, drug, or tobacco us . Patient reported current stressors as: Health problems, being a good role model for son, maintain boundaries with biological family. Mother, step-father, and father drink. Patient identified current supports as wife Landscape architect, and AA sponsor Josiah Nigh. Patient works as a Nature conservation officer, works for [employer], 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 Has worked there 10-11 weeks, and he enjoys it. It was recommended that patient participate in individual psychotherapy every other week. Next appt. June 2 @ noon.  A follow-up was scheduled to create a treatment plan and begin treatment. Therapist answered all questions during the evaluation and contact information was provided.     Fran Imus

## 2023-12-11 ENCOUNTER — Ambulatory Visit
Attending: Student in an Organized Health Care Education/Training Program | Admitting: Student in an Organized Health Care Education/Training Program

## 2023-12-11 DIAGNOSIS — M5020 Other cervical disc displacement, unspecified cervical region: Secondary | ICD-10-CM

## 2023-12-11 DIAGNOSIS — M4802 Spinal stenosis, cervical region: Secondary | ICD-10-CM

## 2023-12-11 DIAGNOSIS — M5412 Radiculopathy, cervical region: Secondary | ICD-10-CM | POA: Diagnosis not present

## 2023-12-11 DIAGNOSIS — M5124 Other intervertebral disc displacement, thoracic region: Secondary | ICD-10-CM | POA: Diagnosis not present

## 2023-12-11 DIAGNOSIS — M5414 Radiculopathy, thoracic region: Secondary | ICD-10-CM | POA: Diagnosis not present

## 2023-12-11 NOTE — Progress Notes (Signed)
 PROVIDER NOTE: Interpretation of information contained herein should be left to medically-trained personnel. Specific patient instructions are provided elsewhere under "Patient Instructions" section of medical record. This document was created in part using AI and STT-dictation technology, any transcriptional errors that may result from this process are unintentional.  Patient: Damon Blair  Service: E/M   PCP: Jacklin Mascot, MD  DOB: 1990/08/17  DOS: 12/11/2023  Provider: Cephus Collin, MD  MRN: [id number]  Delivery: Virtual Visit  Specialty: Interventional Pain Management  Type: Established Patient  Setting: Ambulatory outpatient facility  Specialty designation: 09  Referring Prov.: Bair, Randa Burton, MD  Location: Remote location       Virtual Encounter - Pain Management PROVIDER NOTE: Information contained herein reflects review and annotations entered in association with encounter. Interpretation of such information and data should be left to medically-trained personnel. Information provided to patient can be located elsewhere in the medical record under "Patient Instructions". Document created using STT-dictation technology, any transcriptional errors that may result from process are unintentional.    Contact & Pharmacy Preferred: [phone] Home: [phone] (home) Mobile: [phone] (mobile) E-mail: [email or web address]  [facility] [place], [place] - [address] [place] [address] Phone: [phone] Fax: [phone]  [facility] - [place], [place] - [address] [facility] [address] [place] [address] Phone: [phone] Fax: [phone]   Pre-screening  Damon Blair offered "in-person" vs "virtual" encounter. He indicated preferring virtual for this encounter.   Reason COVID-19*  Social distancing based on CDC and AMA recommendations.   I contacted Gerome Kokesh on 12/11/2023 via telephone.      I clearly  identified myself as Cephus Collin, MD. I verified that I was speaking with the correct person using two identifiers (Name: Damon Blair, and date of birth: June 01, 1991).  Consent I sought verbal advanced consent from Damon Blair for virtual visit interactions. I informed Damon Blair of possible security and privacy concerns, risks, and limitations associated with providing "not-in-person" medical evaluation and management services. I also informed Damon Blair of the availability of "in-person" appointments. Finally, I informed him that there would be a charge for the virtual visit and that he could be  personally, fully or partially, financially responsible for it. Damon Blair expressed understanding and agreed to proceed.   Historic Elements   Damon Blair is a 33 y.o. year old, male patient evaluated today after our last contact on 11/19/2023. Damon Blair  has a past medical history of Anxiety, Arthritis, Kidney stones, MS (multiple sclerosis) (HCC), and Neuromuscular disorder (HCC). He also  has a past surgical history that includes Fracture surgery (Left); Cystoscopy/ureteroscopy/holmium laser/stent placement (Left, 11/18/2021); and Cystoscopy w/ retrogrades (11/18/2021). Damon Blair has a current medication list which includes the following prescription(s): acetaminophen , amphetamine -dextroamphetamine , baclofen , cetirizine , vitamin d -3, cyanocobalamin , diazepam , dronabinol , gabapentin , glatopa , imiquimod , naproxen , and fish oil. He  reports that he has quit smoking. His smoking use included cigarettes. He has never been exposed to tobacco smoke. He has quit using smokeless tobacco.  His smokeless tobacco use included snuff. He reports that he does not currently use alcohol. He reports that he does not currently use drugs. Damon Blair is allergic to clavulanic acid, dilaudid  [hydromorphone ], oxycodone -acetaminophen , and oxycodone .  BMI: Estimated body mass  index is 28.06 kg/m as calculated from the following:   Height as of 11/19/23: 5\' 9"  (1.753 m).   Weight as of 11/19/23: 190 lb (86.2 kg). Last encounter: 08/08/2023. Last procedure: 11/19/2023.  HPI  Today, he is  being contacted for a post-procedure assessment.  Post-procedure evaluation  Inter-Laminar Thoracic Epidural Steroid Block/Injection  #1  Laterality:  Right Level: T7-8  Imaging: Fluoroscopic guidance Anesthesia: Local anesthesia (1-2% Lidocaine ) Anxiolysis:  minimal PO Valium  Sedation: None.  DOS: 11/19/2023 Performed by: Cephus Collin, MD  Purpose: Diagnostic/Therapeutic Indications: Thoracic back pain, radicular pain, with degenerative disc disease severe enough to impact quality of life or function. 1. Thoracic disc herniation   2. Radicular pain of thoracic region    NAS-11 Pain score:   Pre-procedure: 7 /10   Post-procedure: 7 /10     Effectiveness:  Initial hour after procedure: 100 %  Subsequent 4-6 hours post-procedure: 100 %  Analgesia past initial 6 hours: 95 % (95)  Ongoing improvement:  Analgesic:  95% Function: Somewhat improved ROM: Somewhat improved    UDS:  Summary  Date Value Ref Range Status  07/12/2023 FINAL  Corrected    Comment:    ==================================================================== [employer] Select 13 (MW) ==================================================================== Specimen Alert A duplicate report has been generated due to demographic updates. ==================================================================== Test                             Result       Flag       Units  Drug Present and Declared for Prescription Verification   Amphetamine                     914          EXPECTED   ng/mg creat    Amphetamine  is available as a schedule II prescription drug.    Desmethyldiazepam              221          EXPECTED   ng/mg creat   Oxazepam                       796          EXPECTED   ng/mg creat    Temazepam                      424          EXPECTED   ng/mg creat    Desmethyldiazepam, oxazepam, and temazepam are expected metabolites    of diazepam . Desmethyldiazepam and oxazepam are also expected    metabolites of other drugs, including chlordiazepoxide, prazepam,    clorazepate, and halazepam. Oxazepam is an expected metabolite of    temazepam. Oxazepam and temazepam are also available as scheduled    prescription medications.    Carboxy-THC                    >1389        EXPECTED   ng/mg creat    Carboxy-THC is a metabolite of tetrahydrocannabinol (THC). Source of    THC is most commonly herbal marijuana or marijuana-based products,    but THC is also present in a scheduled prescription medication.    Trace amounts of THC can be present in hemp and cannabidiol (CBD)    products. This test is not intended to distinguish between delta-9-    tetrahydrocannabinol, the predominant form of THC in most herbal or    marijuana-based products, and delta-8-tetrahydrocannabinol.  Drug Present not Declared for Prescription Verification   Alprazolam  115          UNEXPECTED ng/mg creat   Alpha-hydroxyalprazolam        190          UNEXPECTED ng/mg creat    Source of alprazolam is a scheduled prescription medication. Alpha-    hydroxyalprazolam is an expected metabolite of alprazolam.  ==================================================================== Test                      Result    Flag   Units      Ref Range   Creatinine              72               mg/dL      >=09 ==================================================================== Declared Medications:  The flagging and interpretation on this report are based on the  following declared medications.  Unexpected results may arise from  inaccuracies in the declared medications.   **Note: The testing scope of this panel includes these medications:   Amphetamine  (Adderall)  Diazepam  (Valium )   Tetrahydrocannabinol (Marinol )   **Note: The testing scope of this panel does not include the  following reported medications:   Acetaminophen  (Tylenol )  Baclofen  (Lioresal )  Cetirizine  (Zyrtec )  Fish Oil  Gabapentin  (Neurontin )  Glatiramer  (Glatopa )  Indomethacin  (Indocin )  Naproxen  (Naprosyn )  Rosuvastatin  (Crestor )  Vitamin B12  Vitamin D3 ==================================================================== For clinical consultation, please call [phone]. ====================================================================    No results found for: "CBDTHCR", "D8THCCBX", "D9THCCBX"  Laboratory Chemistry Profile   Renal Lab Results  Component Value Date   BUN 14 11/12/2023   CREATININE 0.76 11/12/2023   GFR 119.06 11/12/2023   GFRNONAA >60 06/01/2023    Hepatic Lab Results  Component Value Date   AST 16 11/12/2023   ALT 15 11/12/2023   ALBUMIN 4.6 11/12/2023   ALKPHOS 36 (L) 11/12/2023   LIPASE 34 01/20/2022    Electrolytes Lab Results  Component Value Date   NA 142 11/12/2023   K 4.9 11/12/2023   CL 108 11/12/2023   CALCIUM  9.7 11/12/2023   MG 1.7 05/08/2023    Bone Lab Results  Component Value Date   VD25OH 31.80 07/16/2023    Inflammation (CRP: Acute Phase) (ESR: Chronic Phase) Lab Results  Component Value Date   LATICACIDVEN 0.7 11/15/2021         Note: Above Lab results reviewed.   Plan of Care  1. Thoracic disc herniation (Primary) - Patient is responding favorably after his thoracic epidural steroid injection done at T7, T8.  He endorses analgesic benefit and improvement in functional ability.  Will continue to monitor, repeat as needed  2. Radicular pain of thoracic region - Patient is responding favorably after his thoracic epidural steroid injection done at T7, T8.  He endorses analgesic benefit and improvement in functional ability.  Will continue to monitor, repeat as needed  3. Cervical radicular pain -Patient is having a  flareup of his cervical radicular pain.  His previous cervical epidural steroid injection was done 07/16/2023, left C7-T1 ESI.  He has a history of multiple sclerosis and the majority of his MS lesions are in his cervical spine.  He received 75% pain relief with his previous cervical ESI done 07/16/2023.  Given return of his cervical radicular pain, recommend repeating. - Cervical Epidural Injection; Future  4. Protrusion of cervical intervertebral disc - Cervical Epidural Injection; Future  5. Cervical stenosis of spine - Cervical Epidural Injection; Future  Orders:  Orders Placed This Encounter  Procedures   Cervical Epidural Injection    Sedation: PO Valium  Purpose: Diagnostic/Therapeutic Indication(s): Radiculitis and cervicalgia associater with cervical degenerative disc disease.    Standing Status:   Future    Expected Date:   01/07/2024    Expiration Date:   03/12/2024    Scheduling Instructions:     Procedure: Cervical Epidural Steroid Injection/Block     Level(s): C7-T1     Laterality: TBD     Timeframe: As soon as schedule allows.    Where will this procedure be performed?:   [facility]             Shekera Beavers   Follow-up plan:   Return in about 22 days (around 01/02/2024) for Left C-ESI, in clinic (PO Valium  5mg ).      Left C-ESI 07/16/23, Right Thoracic ESI 11/19/23     Recent Visits Date Type Provider Dept  11/19/23 Procedure visit Cephus Collin, MD [facility]  Showing recent visits within past 90 days and meeting all other requirements Today's Visits Date Type Provider Dept  12/11/23 Office Visit Cephus Collin, MD [facility]  Showing today's visits and meeting all other requirements Future Appointments No visits were found meeting these conditions. Showing future appointments within next 90 days and meeting all other requirements  I discussed the assessment and treatment plan with the patient. The patient was provided an  opportunity to ask questions and all were answered. The patient agreed with the plan and demonstrated an understanding of the instructions.  Patient advised to call back or seek an in-person evaluation if the symptoms or condition worsens.  Duration of encounter: .  Note by: Cephus Collin, MD Date: 12/11/2023; Time: 2:49 PM

## 2023-12-13 ENCOUNTER — Telehealth: Admitting: Student in an Organized Health Care Education/Training Program

## 2023-12-17 ENCOUNTER — Other Ambulatory Visit: Payer: Self-pay | Admitting: Family Medicine

## 2023-12-17 ENCOUNTER — Encounter: Payer: Self-pay | Admitting: Neurology

## 2023-12-17 ENCOUNTER — Other Ambulatory Visit: Payer: Self-pay | Admitting: Neurology

## 2023-12-17 ENCOUNTER — Ambulatory Visit (INDEPENDENT_AMBULATORY_CARE_PROVIDER_SITE_OTHER): Admitting: Psychology

## 2023-12-17 ENCOUNTER — Other Ambulatory Visit: Payer: Self-pay

## 2023-12-17 DIAGNOSIS — F902 Attention-deficit hyperactivity disorder, combined type: Secondary | ICD-10-CM | POA: Insufficient documentation

## 2023-12-17 DIAGNOSIS — F1910 Other psychoactive substance abuse, uncomplicated: Secondary | ICD-10-CM

## 2023-12-17 DIAGNOSIS — F410 Panic disorder [episodic paroxysmal anxiety] without agoraphobia: Secondary | ICD-10-CM | POA: Diagnosis not present

## 2023-12-17 DIAGNOSIS — F064 Anxiety disorder due to known physiological condition: Secondary | ICD-10-CM | POA: Diagnosis not present

## 2023-12-17 DIAGNOSIS — G35 Multiple sclerosis: Secondary | ICD-10-CM

## 2023-12-17 MED ORDER — AMPHETAMINE-DEXTROAMPHETAMINE 10 MG PO TABS: 10.0000 mg | ORAL_TABLET | Freq: Every day | ORAL | 0 refills | Status: DC

## 2023-12-17 MED ORDER — GABAPENTIN 800 MG PO TABS: 800.0000 mg | ORAL_TABLET | Freq: Three times a day (TID) | ORAL | 1 refills | Status: DC

## 2023-12-17 MED ORDER — AMPHETAMINE-DEXTROAMPHETAMINE 10 MG PO TABS
10.0000 mg | ORAL_TABLET | Freq: Every day | ORAL | 0 refills | Status: DC
Start: 2024-01-16 — End: 2024-01-31

## 2023-12-17 NOTE — Telephone Encounter (Signed)
 PDMP reviewed.   Refill on Adderall 10 mg sent to the pharmacy.   Jacklin Mascot, MD

## 2023-12-17 NOTE — Progress Notes (Unsigned)
 [facility] Counselor/Therapist Progress Note  Patient ID: Damon Blair, MRN: [id number]   Date: 12/17/23  Time Spent: 12:00 - 12:55 pm  : 55  minutes  Treatment Type: Individual Therapy.  Reported Symptoms:  Anxiety: Feeling nervous, anxious or on edge, not being able to stop or control worrying, trouble relaxing, being so restless that it is hard to sit still, becoming easily annoyed or irritable. Depression: little interest or pleasure in doing things,  trouble falling or staying asleep, or sleeping too much, feeling tired or having little energy,  trouble concentrating on things, such as reading the newspaper or watching television, moving or speaking so slowly that other people could have noticed, or the opposite, being so fidgety or restless that you have been moving around a lot more than usual.   Mental Status Exam: Appearance:  Well Groomed     Behavior: Appropriate  Motor: Normal  Speech/Language:  Normal Rate  Affect: Appropriate  Mood: normal  Thought process: normal  Thought content:   WNL  Sensory/Perceptual disturbances:   WNL  Orientation: oriented to person, place, and time/date  Attention: Good  Concentration: Good  Memory: WNL  Fund of knowledge:  Good  Insight:   Good  Judgment:  Good  Impulse Control: Good   Risk Assessment: Danger to Self:  No Self-injurious Behavior: No Danger to Others: No Duty to Warn:no Physical Aggression / Violence:No  Access to Firearms a concern: No  Gang Involvement:No   Subjective:   Damon Blair participated from home, via video and consented to treatment. Therapist participated from home office. I discussed the limitations of evaluation and management by telemedicine and the availability of in person appointments.   The patient expressed understanding and agreed to proceed. Damon Blair reviewed the events of the past week. Patient stated that his recovery is different this time, because he is doing it  differently this time. Patient also reported that he follows. We reviewed numerous treatment approaches including CBT, BA, Problem Solving, and Solution focused therapy. 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   We discussed Damon Blair's goals treatment goals which include:  Stay on track with sobriety: I am working with a grand sponsor to work all of my AA steps.  2. Maintain Spirituality: We have a devotional that my family and I start out with every morning.  3. Figure out more effective parenting skills.  Guide son with regard to discipline, and patient seeing himself in his son and vice a versa  Damon Blair provided verbal approval of the treatment plan.   Interventions: Psycho-education & Goal Setting.   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  PCP Dr. Emi Hanson Referral ADHD Sponsor-Jim new sponsor since one month  Treatment Plan:  Client Abilities/Strengths Damon Blair is motivated, verbal, and family oriented  Support System: spouse Landscape architect, [place], the Damon Blair, church Christianity  Client Treatment Preferences OPT  Client Statement of Needs Damon Blair would like to stay on track with sobriety, stay on track with sobriety, and figure out more effective parenting skills.    Treatment Level Weekly/Biweekly  Symptoms  Anxiety: Feeling nervous, anxious or on edge, not being able to stop or control worrying, trouble relaxing, being so restless that it is hard to sit still, becoming easily  annoyed or irritable. Depression: little interest or pleasure in doing things,  trouble falling or staying asleep, or sleeping too much, feeling tired or having little  energy,  trouble concentrating on things, such as reading the newspaper or watching television, moving or speaking so slowly that other people could have noticed, or the opposite, being so fidgety or restless that you have been moving around a lot more than usual.   Goals:   Damon Blair experiences symptoms of anxiety and substance abuse.  Treatment plan signed and available on s-drive:  No    Target Date: 12/02/24 Frequency: Weekly/Biweekly  Progress: 0 Modality: individual    Therapist will provide referrals for additional resources as appropriate.  Therapist will provide psycho-education regarding Damon Blair's diagnosis and corresponding treatment approaches and interventions. Fran Imus will support the patient's ability to achieve the goals identified. will employ CBT, BA, Problem-solving, Solution Focused, Mindfulness,  coping skills, & other evidenced-based practices will be used to promote progress towards healthy functioning to help manage decrease symptoms associated with their diagnosis.   Reduce overall level, frequency, and intensity of the feelings of depression, anxiety and panic evidenced by decreased overall symptoms from 6 to 7 days/week to 0 to 1 days/week per client report for at least 3 consecutive months. Verbally express understanding of the relationship between feelings of anxiety and it's impact on thinking patterns and behaviors. Verbalize an understanding of the role that distorted thinking plays in creating fears, excessive worry, and ruminations.    Damon Blair participated in the creation of the treatment plan)    Fran Imus

## 2023-12-18 NOTE — Telephone Encounter (Signed)
 Last seen on 07/05/23 Follow up scheduled on 01/31/24   Dispensed Days Supply Quantity Provider Pharmacy  DRONABINOL  5 MG CAPSULE 11/05/2023 30 60 each Sater, Sherida Dimmer, MD [facility] [phone] - B...     Rx pending to be signed

## 2023-12-20 ENCOUNTER — Ambulatory Visit: Admitting: Psychology

## 2023-12-24 ENCOUNTER — Encounter: Admitting: Psychology

## 2023-12-24 NOTE — Progress Notes (Signed)
 This encounter was created in error - please disregard.

## 2023-12-24 NOTE — Progress Notes (Deleted)
 [facility] Counselor/Therapist Progress Note  Patient ID: Damon Blair, MRN: [id number]    Date: 12/24/23  Time Spent: ***  {onmampm:26702} - *** {onmampm:26702} : *** Minutes  Treatment Type: Individual Therapy.  Reported Symptoms: ***  Mental Status Exam: Appearance:  {PSY:22683}     Behavior: {PSY:21022743}  Motor: {PSY:22302}  Speech/Language:  {PSY:22685}  Affect: {PSY:22687}  Mood: {PSY:31886}  Thought process: {PSY:31888}  Thought content:   {[phone]}  Sensory/Perceptual disturbances:   {[phone]}  Orientation: {PSY:30297}  Attention: {PSY:22877}  Concentration: {PSY:608-744-6959}  Memory: {PSY:725-200-9893}  Fund of knowledge:  {PSY:608-744-6959}  Insight:   {PSY:608-744-6959}  Judgment:  {PSY:608-744-6959}  Impulse Control: {PSY:608-744-6959}   Risk Assessment: Danger to Self:  {PSY:22692} Self-injurious Behavior: {PSY:22692} Danger to Others: {PSY:22692} Duty to Warn:{PSY:311194} Physical Aggression / Violence:{PSY:21197} Access to Firearms a concern: {PSY:21197} Gang Involvement:{PSY:21197}  Subjective:   Damon Blair participated in the session, in person in the office with the therapist, and consented to treatment. Damon Blair reviewed the events of the past week.   ***   Interventions: {[phone]}  Diagnosis:   No diagnosis found.  Psychiatric Treatment: {YES/NO:21197}, ***  Treatment Plan:  Client Abilities/Strengths Damon Blair ***  Support System: ***  Client Treatment Preferences ***  Client Statement of Needs Damon Blair would like to ***   Treatment Level {Frequency of sessions.:26745}  Symptoms  ***   (Status: {Symptom Status:26744}) ***   (Status: {Symptom Status:26744})  Goals:   Damon Blair experiences symptoms of ***   Target Date: *** Frequency: {Frequency of sessions.:26745}  Progress: 0 Modality: individual    Therapist will provide referrals for additional resources as appropriate.  Therapist will  provide psycho-education regarding Damon Blair diagnosis and corresponding treatment approaches and interventions. Damon Blair will support the patient's ability to achieve the goals identified. will employ CBT, BA, Problem-solving, Solution Focused, Mindfulness,  coping skills, & other evidenced-based practices will be used to promote progress towards healthy functioning to help manage decrease symptoms associated with {his/her/their:21314} diagnosis.   Reduce overall level, frequency, and intensity of the feelings of depression, anxiety and panic evidenced by decreased *** from 6 to 7 days/week to 0 to 1 days/week per client report for at least 3 consecutive months. Verbally express understanding of the relationship between feelings of ***depression, ***anxiety and their impact on thinking patterns and behaviors. Verbalize an understanding of the role that distorted thinking plays in creating fears, excessive worry, and ruminations.  Damon Blair participated in the creation of the treatment plan)      Damon Blair

## 2024-01-03 ENCOUNTER — Encounter: Admitting: Psychology

## 2024-01-03 NOTE — Progress Notes (Deleted)
 This encounter was created in error - please disregard.

## 2024-01-03 NOTE — Progress Notes (Signed)
                Damon Blair

## 2024-01-07 ENCOUNTER — Encounter: Payer: Self-pay | Admitting: Student in an Organized Health Care Education/Training Program

## 2024-01-07 ENCOUNTER — Ambulatory Visit
Admission: RE | Admit: 2024-01-07 | Discharge: 2024-01-07 | Disposition: A | Discharge status: Home / Self Care | Source: Ambulatory Visit | Attending: Student in an Organized Health Care Education/Training Program | Admitting: Student in an Organized Health Care Education/Training Program

## 2024-01-07 ENCOUNTER — Ambulatory Visit (HOSPITAL_BASED_OUTPATIENT_CLINIC_OR_DEPARTMENT_OTHER): Admitting: Student in an Organized Health Care Education/Training Program

## 2024-01-07 ENCOUNTER — Encounter: Admitting: Psychology

## 2024-01-07 DIAGNOSIS — M4802 Spinal stenosis, cervical region: Secondary | ICD-10-CM

## 2024-01-07 DIAGNOSIS — M5020 Other cervical disc displacement, unspecified cervical region: Secondary | ICD-10-CM

## 2024-01-07 DIAGNOSIS — M5412 Radiculopathy, cervical region: Secondary | ICD-10-CM | POA: Diagnosis not present

## 2024-01-07 MED ORDER — DEXAMETHASONE SODIUM PHOSPHATE 10 MG/ML IJ SOLN
10.0000 mg | Freq: Once | INTRAMUSCULAR | Status: AC
  Administered 2024-01-07: 10 mg
  Filled 2024-01-07: qty 1

## 2024-01-07 MED ORDER — ROPIVACAINE HCL 2 MG/ML IJ SOLN
1.0000 mL | Freq: Once | INTRAMUSCULAR | Status: AC
  Administered 2024-01-07: 20 mL via EPIDURAL
  Filled 2024-01-07: qty 20

## 2024-01-07 MED ORDER — IOHEXOL 180 MG/ML  SOLN
INTRAMUSCULAR | Status: AC
  Filled 2024-01-07: qty 20

## 2024-01-07 MED ORDER — LIDOCAINE HCL 2 % IJ SOLN
20.0000 mL | Freq: Once | INTRAMUSCULAR | Status: AC
  Administered 2024-01-07: 400 mg
  Filled 2024-01-07: qty 20

## 2024-01-07 MED ORDER — DIAZEPAM 5 MG PO TABS
5.0000 mg | ORAL_TABLET | ORAL | Status: AC
  Administered 2024-01-07: 5 mg via ORAL

## 2024-01-07 MED ORDER — SODIUM CHLORIDE 0.9% FLUSH
1.0000 mL | Freq: Once | INTRAVENOUS | Status: AC
  Administered 2024-01-07: 10 mL

## 2024-01-07 MED ORDER — DIAZEPAM 5 MG PO TABS
ORAL_TABLET | ORAL | Status: AC
  Filled 2024-01-07: qty 1

## 2024-01-07 NOTE — Progress Notes (Signed)
 This encounter was created in error - please disregard.

## 2024-01-07 NOTE — Progress Notes (Signed)
 PROVIDER NOTE: Interpretation of information contained herein should be left to medically-trained personnel. Specific patient instructions are provided elsewhere under Patient Instructions section of medical record. This document was created in part using STT-dictation technology, any transcriptional errors that may result from this process are unintentional.  Patient: Damon Blair Type: Established DOB: Mar 10, 1991 MRN: [id number] PCP: Abbey Bruckner, MD  Service: Procedure DOS: 01/07/2024 Setting: Ambulatory Location: Ambulatory outpatient facility Delivery: Face-to-face Provider: Wallie Sherry, MD Specialty: Interventional Pain Management Specialty designation: 09 Location: Outpatient facility Ref. Prov.: Sherry Wallie, MD       Interventional Therapy   Procedure: Cervical Epidural Steroid injection (CESI) (Interlaminar) #2  Laterality: Midline  Level: C7-T1 Imaging: Fluoroscopy-assisted DOS: 01/07/2024  Performed by: Wallie Sherry, MD Anesthesia: Local anesthesia (1-2% Lidocaine ) Sedation: Minimal Sedation                         Purpose: Diagnostic/Therapeutic Indications: Cervicalgia, cervical radicular pain, degenerative disc disease, severe enough to impact quality of life or function. 1. Cervical radicular pain   2. Protrusion of cervical intervertebral disc   3. Cervical stenosis of spine    NAS-11 score:   Pre-procedure: 7 /10   Post-procedure: 6 /10      Position  Prep  Materials:  Location setting: Procedure suite Position: Prone, on modified reverse trendelenburg to facilitate breathing, with head in head-cradle. Pillows positioned under chest (below chin-level) with cervical spine flexed. Safety Precautions: Patient was assessed for positional comfort and pressure points before starting the procedure. Prepping solution: DuraPrep (Iodine Povacrylex [0.7% available iodine] and Isopropyl Alcohol, 74% w/w) Prep Area: Entire  cervicothoracic region Approach:  percutaneous, paramedial Intended target: Posterior cervical epidural space Materials Procedure:  Tray: Epidural Needle(s): Epidural (Tuohy) Qty: 1 Length: (90mm) 3.5-inch Gauge: 22G   H&P (Pre-op Assessment):  [name] is a 33 y.o. (year old), male patient, seen today for interventional treatment. He  has a past surgical history that includes Fracture surgery (Left); Cystoscopy/ureteroscopy/holmium laser/stent placement (Left, 11/18/2021); and Cystoscopy w/ retrogrades (11/18/2021). [name] has a current medication list which includes the following prescription(s): acetaminophen , amphetamine -dextroamphetamine , [START ON 01/16/2024] amphetamine -dextroamphetamine , [START ON 02/15/2024] amphetamine -dextroamphetamine , baclofen , cetirizine , vitamin d -3, cyanocobalamin , diazepam , dronabinol , gabapentin , glatopa , imiquimod , naproxen , and fish oil. His primarily concern today is the Neck Pain  Initial Vital Signs:  Pulse/HCG Rate: 75  Temp: 98.4 F (36.9 C) Resp: 16 BP: 126/75 SpO2: 100 %  BMI: Estimated body mass index is 28.06 kg/m as calculated from the following:   Height as of this encounter: 5' 9 (1.753 m).   Weight as of this encounter: 190 lb (86.2 kg).  Risk Assessment: Allergies: Reviewed. He is allergic to clavulanic acid, dilaudid  [hydromorphone ], oxycodone -acetaminophen , and oxycodone .  Allergy Precautions: None required Coagulopathies: Reviewed. None identified.  Blood-thinner therapy: None at this time Active Infection(s): Reviewed. None identified. [name] is afebrile  Site Confirmation: [name] was asked to confirm the procedure and laterality before marking the site Procedure checklist: Completed Consent: Before the procedure and under the influence of no sedative(s), amnesic(s), or anxiolytics, the patient was informed of the treatment options, risks and possible complications. To fulfill our ethical and legal obligations, as recommended  by the American Medical Association's Code of Ethics, I have informed the patient of my clinical impression; the nature and purpose of the treatment or procedure; the risks, benefits, and possible complications of the intervention; the alternatives, including doing nothing; the risk(s) and benefit(s) of the alternative treatment(s) or procedure(s); and  the risk(s) and benefit(s) of doing nothing. The patient was provided information about the general risks and possible complications associated with the procedure. These may include, but are not limited to: failure to achieve desired goals, infection, bleeding, organ or nerve damage, allergic reactions, paralysis, and death. In addition, the patient was informed of those risks and complications associated to Spine-related procedures, such as failure to decrease pain; infection (i.e.: Meningitis, epidural or intraspinal abscess); bleeding (i.e.: epidural hematoma, subarachnoid hemorrhage, or any other type of intraspinal or peri-dural bleeding); organ or nerve damage (i.e.: Any type of peripheral nerve, nerve root, or spinal cord injury) with subsequent damage to sensory, motor, and/or autonomic systems, resulting in permanent pain, numbness, and/or weakness of one or several areas of the body; allergic reactions; (i.e.: anaphylactic reaction); and/or death. Furthermore, the patient was informed of those risks and complications associated with the medications. These include, but are not limited to: allergic reactions (i.e.: anaphylactic or anaphylactoid reaction(s)); adrenal axis suppression; blood sugar elevation that in diabetics may result in ketoacidosis or comma; water retention that in patients with history of congestive heart failure may result in shortness of breath, pulmonary edema, and decompensation with resultant heart failure; weight gain; swelling or edema; medication-induced neural toxicity; particulate matter embolism and blood vessel occlusion with  resultant organ, and/or nervous system infarction; and/or aseptic necrosis of one or more joints. Finally, the patient was informed that Medicine is not an exact science; therefore, there is also the possibility of unforeseen or unpredictable risks and/or possible complications that may result in a catastrophic outcome. The patient indicated having understood very clearly. We have given the patient no guarantees and we have made no promises. Enough time was given to the patient to ask questions, all of which were answered to the patient's satisfaction. Mr. Roback has indicated that he wanted to continue with the procedure. Attestation: I, the ordering provider, attest that I have discussed with the patient the benefits, risks, side-effects, alternatives, likelihood of achieving goals, and potential problems during recovery for the procedure that I have provided informed consent. Date  Time: 01/07/2024  9:27 AM   Pre-Procedure Preparation:  Monitoring: As per clinic protocol. Respiration, ETCO2, SpO2, BP, heart rate and rhythm monitor placed and checked for adequate function Safety Precautions: Patient was assessed for positional comfort and pressure points before starting the procedure. Time-out: I initiated and conducted the Time-out before starting the procedure, as per protocol. The patient was asked to participate by confirming the accuracy of the Time Out information. Verification of the correct person, site, and procedure were performed and confirmed by me, the nursing staff, and the patient. Time-out conducted as per Joint Commission's Universal Protocol (UP.01.01.01). Time: 1021 Start Time: 1021 hrs.  Description  Narrative of Procedure:          Rationale (medical necessity): procedure needed and proper for the diagnosis and/or treatment of the patient's medical symptoms and needs. Start Time: 1021 hrs. Safety Precautions: Aspiration looking for blood return was conducted prior  to all injections. At no point did we inject any substances, as a needle was being advanced. No attempts were made at seeking any paresthesias. Safe injection practices and needle disposal techniques used. Medications properly checked for expiration dates. SDV (single dose vial) medications used. Description of procedure: Protocol guidelines were followed. The patient was assisted into a comfortable position. The target area was identified and the area prepped in the usual manner. Skin & deeper tissues infiltrated with local anesthetic. Appropriate amount of  time allowed to pass for local anesthetics to take effect. Using fluoroscopic guidance, the epidural needle was introduced through the skin, ipsilateral to the reported pain, and advanced to the target area. Posterior laminar os was contacted and the needle walked caudad, until the lamina was cleared. The ligamentum flavum was engaged and the epidural space identified using "loss-of-resistance technique" with 2-3 ml of PF-NaCl (0.9% NSS), in a 5cc dedicated LOR syringe. (See Imaging guidance below for use of contrast details.) Once proper needle placement was secured, and negative aspiration confirmed, the solution was injected in intermittent fashion, asking for systemic symptoms every 0.5cc. The needles were then removed and the area cleansed, making sure to leave some of the prepping solution back to take advantage of its long term bactericidal properties.  Vitals:   01/07/24 0939 01/07/24 1024  BP: 126/75 124/83  Pulse: 75 61  Resp: 16 10  Temp: 98.4 F (36.9 C)   SpO2: 100% 100%  Weight: 190 lb (86.2 kg)   Height: 5' 9 (1.753 m)       End Time: 1024 hrs.  Imaging Guidance (Spinal):          Type of Imaging Technique: Fluoroscopy Guidance (Spinal) Indication(s): Fluoroscopy guidance for needle placement to enhance accuracy in procedures requiring precise needle localization for targeted delivery of medication in or near specific  anatomical locations not easily accessible without such real-time imaging assistance. Exposure Time: Please see nurses notes. Contrast: Before injecting any contrast, we confirmed that the patient did not have an allergy to iodine, shellfish, or radiological contrast. Once satisfactory needle placement was completed at the desired level, radiological contrast was injected. Contrast injected under live fluoroscopy. No contrast complications. See chart for type and volume of contrast used. Fluoroscopic Guidance: I was personally present during the use of fluoroscopy. Tunnel Vision Technique used to obtain the best possible view of the target area. Parallax error corrected before commencing the procedure. Direction-depth-direction technique used to introduce the needle under continuous pulsed fluoroscopy. Once target was reached, antero-posterior, oblique, and lateral fluoroscopic projection used confirm needle placement in all planes. Images permanently stored in EMR. Interpretation: I personally interpreted the imaging intraoperatively. Adequate needle placement confirmed in multiple planes. Appropriate spread of contrast into desired area was observed. No evidence of afferent or efferent intravascular uptake. No intrathecal or subarachnoid spread observed. Permanent images saved into the patient's record.  Post-operative Assessment:  Post-procedure Vital Signs:  Pulse/HCG Rate: 61  Temp: 98.4 F (36.9 C) Resp: 10 BP: 124/83 SpO2: 100 %  EBL: None  Complications: No immediate post-treatment complications observed by team, or reported by patient.  Note: The patient tolerated the entire procedure well. A repeat set of vitals were taken after the procedure and the patient was kept under observation following institutional policy, for this type of procedure. Post-procedural neurological assessment was performed, showing return to baseline, prior to discharge. The patient was provided with  post-procedure discharge instructions, including a section on how to identify potential problems. Should any problems arise concerning this procedure, the patient was given instructions to immediately contact us , at any time, without hesitation. In any case, we plan to contact the patient by telephone for a follow-up status report regarding this interventional procedure.  Comments:  No additional relevant information.  Plan of Care (POC)  Orders:  Orders Placed This Encounter  Procedures   DG PAIN [facility] C-ARM 1-60 MIN NO REPORT    Intraoperative interpretation by procedural physician at [facility].  Standing Status:   Standing    Number of Occurrences:   1    Reason for exam::   Assistance in needle guidance and placement for procedures requiring needle placement in or near specific anatomical locations not easily accessible without such assistance.    Medications ordered for procedure: Meds ordered this encounter  Medications   lidocaine  (XYLOCAINE ) 2 % (with pres) injection 400 mg   diazepam  (VALIUM ) tablet 5 mg    Make sure Flumazenil is available in the pyxis when using this medication. If oversedation occurs, administer 0.2 mg IV over 15 sec. If after 45 sec no response, administer 0.2 mg again over 1 min; may repeat at 1 min intervals; not to exceed 4 doses (1 mg)   ropivacaine  (PF) 2 mg/mL (0.2%) (NAROPIN ) injection 1 mL   sodium chloride  flush (NS) 0.9 % injection 1 mL   dexamethasone  (DECADRON ) injection 10 mg   Medications administered: We administered lidocaine , diazepam , ropivacaine  (PF) 2 mg/mL (0.2%), sodium chloride  flush, and dexamethasone .  See the medical record for exact dosing, route, and time of administration.  Follow-up plan:   Return in about 6 weeks (around 02/18/2024) for PPE, VV.       Left C-ESI 07/16/23, 01/07/24    Recent Visits Date Type Provider Dept  12/11/23 Office Visit Marcelino Nurse, MD [facility]  11/19/23 Procedure  visit Marcelino Nurse, MD [facility]  Showing recent visits within past 90 days and meeting all other requirements Today's Visits Date Type Provider Dept  01/07/24 Procedure visit Marcelino Nurse, MD [facility]  Showing today's visits and meeting all other requirements Future Appointments No visits were found meeting these conditions. Showing future appointments within next 90 days and meeting all other requirements  Disposition: Discharge home  Discharge (Date  Time): 01/07/2024; 1030 hrs.   Primary Care Physician: Bair, Kalpana, MD Location: [facility] Note by: Nurse Marcelino, MD (TTS technology used. I apologize for any typographical errors that were not detected and corrected.) Date: 01/07/2024; Time: 10:30 AM  Disclaimer:  Medicine is not an Visual merchandiser. The only guarantee in medicine is that nothing is guaranteed. It is important to note that the decision to proceed with this intervention was based on the information collected from the patient. The Data and conclusions were drawn from the patient's questionnaire, the interview, and the physical examination. Because the information was provided in large part by the patient, it cannot be guaranteed that it has not been purposely or unconsciously manipulated. Every effort has been made to obtain as much relevant data as possible for this evaluation. It is important to note that the conclusions that lead to this procedure are derived in large part from the available data. Always take into account that the treatment will also be dependent on availability of resources and existing treatment guidelines, considered by other Pain Management Practitioners as being common knowledge and practice, at the time of the intervention. For Medico-Legal purposes, it is also important to point out that variation in procedural techniques and pharmacological choices are the acceptable norm. The indications,  contraindications, technique, and results of the above procedure should only be interpreted and judged by a Board-Certified Interventional Pain Specialist with extensive familiarity and expertise in the same exact procedure and technique.

## 2024-01-07 NOTE — Patient Instructions (Signed)

## 2024-01-07 NOTE — Progress Notes (Signed)
 Safety precautions to be maintained throughout the outpatient stay will include: orient to surroundings, keep bed in low position, maintain call bell within reach at all times, provide assistance with transfer out of bed and ambulation.

## 2024-01-08 ENCOUNTER — Telehealth: Payer: Self-pay | Admitting: *Deleted

## 2024-01-08 NOTE — Telephone Encounter (Signed)
 No problems post procedure.

## 2024-01-22 ENCOUNTER — Encounter: Payer: Self-pay | Admitting: Neurology

## 2024-01-24 ENCOUNTER — Other Ambulatory Visit: Payer: Self-pay | Admitting: Neurology

## 2024-01-24 MED ORDER — NAPROXEN 375 MG PO TABS: 375.0000 mg | ORAL_TABLET | Freq: Two times a day (BID) | ORAL | 5 refills | Status: DC | PRN

## 2024-01-28 ENCOUNTER — Ambulatory Visit: Admitting: Psychology

## 2024-01-31 ENCOUNTER — Telehealth: Payer: Self-pay | Admitting: Neurology

## 2024-01-31 ENCOUNTER — Ambulatory Visit: Payer: Managed Care, Other (non HMO) | Admitting: Neurology

## 2024-01-31 ENCOUNTER — Encounter: Payer: Self-pay | Admitting: Neurology

## 2024-01-31 VITALS — BP 79/54 | HR 86 | Ht 69.0 in | Wt 194.0 lb

## 2024-01-31 DIAGNOSIS — G894 Chronic pain syndrome: Secondary | ICD-10-CM

## 2024-01-31 DIAGNOSIS — G35 Multiple sclerosis: Secondary | ICD-10-CM | POA: Diagnosis not present

## 2024-01-31 DIAGNOSIS — F902 Attention-deficit hyperactivity disorder, combined type: Secondary | ICD-10-CM | POA: Diagnosis not present

## 2024-01-31 DIAGNOSIS — R269 Unspecified abnormalities of gait and mobility: Secondary | ICD-10-CM

## 2024-01-31 DIAGNOSIS — R3915 Urgency of urination: Secondary | ICD-10-CM

## 2024-01-31 DIAGNOSIS — R5383 Other fatigue: Secondary | ICD-10-CM | POA: Diagnosis not present

## 2024-01-31 NOTE — Progress Notes (Signed)
 [facility]  PATIENT: Damon Blair DOB: 22-Jun-1991  REFERRING DOCTOR OR PCP:  Cresencio Fairly, MD; Glenys Ferrari, MD SOURCE: patient, notes from Dr. Jasmine ([facility]), imaging and lab results.  MRI images personally reviewed.  _________________________________   HISTORICAL  CHIEF COMPLAINT:  Chief Complaint  Patient presents with   RM10/MS    Pt is here with his Son. Pt states that he has some brain fog recently. Pt states that he sometimes has a visual disturbance in his left eye. Pt states that he has pressure in the corner of his left eye, as well as foggy vision.     HISTORY OF PRESENT ILLNESS:  I had the pleasure of seeing your patient, Damon Blair, at the MS center at [facility] for consultation regarding his multiple sclerosis.  He is a 33 year old man who was diagnosed with MS in July 2020 after presenting with nystagmus and paresthesias in the limbs. He was initially placed on Copaxone 40 mg three times per week.   He had a relapse in 2021 with left sided facial weakness and partial left body numbness.  He changed his diet to the the [facility] protocol.     He did well in 2022 and 2023.  More recently, he had the onset of left eye pain, blurry vision and light sensitivity.   He has seen ophthalmology and got new lenses but vision changed and he needed new lenses.   On recheck, vision had changed again.  However, he was apparently correctable to 20/20.  He continued to have eye pain and had an MRI of the orbits.   The orbits were fine but he had a small enhancing right parietal lesion.  Most likely would have been asymptomatic.  He had several courses of IV Solu-medrol  due to the concern about optic neuritis.    This caused him to have some 'manic' like symptoms and poor sleep.   Currently, he notes that he has reduced balance affecting his gait.SABRA   He uses the bannister on stairs.   He can walk long distances and keeps up with others on  long walks.   His left leg is a little weaker than the right.  He also has left leg spasticity and takes baclofen  with benefit.  He has numbness in his fingertips bilaterally.  Sometimes has a Lhermitte sign.   Handwriting is sloppy and he notes mild reduced coordination - especially with his non-dominant left hand.   Voice is slightly slurred.    His vision is much better now, near normal.  Left eye pain is better but not resolved.     He has urinary urgency and occasional urge incontinence dribbling.   He was once on oxybutynin  with some benefit.    He notes cognitive and mood changes.   He sees psychiatry.  Amitriptyline and buspirone were recommended.   He had been on amitriptyline for dysesthesias in the past for dysesthesias.   He feels depressed and is more irritable.  He has ADD and has been on Adderall with some benefit in fatigue and completing tasks.     He is on dronabinol  (was on medical MJ in [place] ) for MS spasticity and pain.    He is sleeping better.  He has neck pain and has had a benefit from a cervical ESI.   He has concerns about stronger MS medication due to h/o Hep C and several viral STD.  He has few outbreak now.   He has Hep  C and was successfully treated.  Recent labs show Ab to Hep C but is RNA negative.     Imaging: MRI of the orbits 06/06/2023 (report summary) showed a similar distribution of white matter changes consistent with MS.  There was a new enhancing 2 mm lesion in the right parietal lobe.  The orbits had an unremarkable appearance.  Chronic sinusitis.  MRI of the brain 05/04/2023 shows T2/FLAIR hyperintense foci in the left anterior medulla, pons, left middle cerebellar peduncle, and in the periventricular, juxtacortical deep white matter of the cerebral hemispheres.  None of the foci enhanced.  1 focus in the basal ganglia/near the genu of the left internal capsule has an appearance more typical of a chronic lacunar infarction.  MRI of the cervical spine  05/05/2023 showed T2 hyperintense foci at the cervicomedullary junction, anteriorly adjacent to C1, anterolaterally to the right adjacent to C2, centrally adjacent to C3-C4, anteriorly to the left adjacent to C5,  MRI of the thoracic spine 12/02/2022 showed a normal spinal cord.  There are small disc protrusions at T6-T7 and T7-T8 but no spinal stenosis or nerve root compression.   Laboratory test: 06/12/2023: Hepatitis B surface antibody positive, surface antigen negative, core antibody negative, TB negative Hep C antibody was reactive but the PCR was negative 05/22/2023: ESR and CRP were normal  REVIEW OF SYSTEMS: Constitutional: No fevers, chills, sweats, or change in appetite Eyes: See above Ear, nose and throat: No hearing loss, ear pain, nasal congestion, sore throat Cardiovascular: No chest pain, palpitations Respiratory:  No shortness of breath at rest or with exertion.   No wheezes GastrointestinaI: No nausea, vomiting, diarrhea, abdominal pain, fecal incontinence Genitourinary:  No dysuria, urinary retention or frequency.  No nocturia. Musculoskeletal:  No neck pain, back pain Integumentary: No rash, pruritus, skin lesions Neurological: as above Psychiatric: Notes anxiety and depression  endocrine: No palpitations, diaphoresis, change in appetite, change in weigh or increased thirst Hematologic/Lymphatic:  No anemia, purpura, petechiae. Allergic/Immunologic: No itchy/runny eyes, nasal congestion, recent allergic reactions, rashes  ALLERGIES: Allergies  Allergen Reactions   Clavulanic Acid    Dilaudid  [Hydromorphone ]    Oxycodone -Acetaminophen  Itching   Oxycodone  Itching    HOME MEDICATIONS:  Current Outpatient Medications:    acetaminophen  (TYLENOL ) 325 MG tablet, TAKE 2 TABLETS BY MOUTH EVERY 6 HOURS AS NEEDED, Disp: 90 tablet, Rfl: 1   [START ON 02/15/2024] amphetamine -dextroamphetamine  (ADDERALL) 10 MG tablet, Take 1 tablet (10 mg total) by mouth daily., Disp: 30  tablet, Rfl: 0   baclofen  (LIORESAL ) 20 MG tablet, Take 2 pills po qAM and 1 pill po qHS, Disp: 270 each, Rfl: 3   cetirizine  (ZYRTEC ) 10 MG tablet, TAKE 1 TABLET BY MOUTH EVERY DAY, Disp: 90 tablet, Rfl: 1   Cholecalciferol (VITAMIN D -3) 25 MCG (1000 UT) CAPS, Take by mouth., Disp: , Rfl:    cyanocobalamin  (VITAMIN B12) 1000 MCG/ML injection, Inject 1 mL (1,000 mcg total) into the muscle every 30 (thirty) days., Disp: 1 mL, Rfl: 11   diazepam  (VALIUM ) 5 MG tablet, TAKE 1 TABLET BY MOUTH EVERYDAY AT BEDTIME, Disp: 30 tablet, Rfl: 3   dronabinol  (MARINOL ) 5 MG capsule, TAKE 1 CAPSULE BY MOUTH TWICE A DAY, Disp: 60 capsule, Rfl: 0   Glatiramer  Acetate (GLATOPA ) 40 MG/ML SOSY, One ml subcu three times a week., Disp: 36 mL, Rfl: 4   imiquimod  (ALDARA ) 5 % cream, Apply topically 3 (three) times a week., Disp: 12 each, Rfl: 0   Omega-3 Fatty Acids (FISH OIL) 500  MG CAPS, Take by mouth., Disp: , Rfl:    amphetamine -dextroamphetamine  (ADDERALL) 10 MG tablet, Take 1 tablet (10 mg total) by mouth daily., Disp: 30 tablet, Rfl: 0   amphetamine -dextroamphetamine  (ADDERALL) 10 MG tablet, Take 1 tablet (10 mg total) by mouth daily., Disp: 30 tablet, Rfl: 0   gabapentin  (NEURONTIN ) 800 MG tablet, Take 1 tablet (800 mg total) by mouth 3 (three) times daily., Disp: 276 tablet, Rfl: 1   naproxen  (NAPROSYN ) 375 MG tablet, Take 1 tablet (375 mg total) by mouth 2 (two) times daily as needed., Disp: 60 tablet, Rfl: 5  PAST MEDICAL HISTORY: Past Medical History:  Diagnosis Date   Anxiety    Arthritis    Kidney stones    Left side   MS (multiple sclerosis) (HCC)    Neuromuscular disorder (HCC)     PAST SURGICAL HISTORY: Past Surgical History:  Procedure Laterality Date   CYSTOSCOPY W/ RETROGRADES  11/18/2021   Procedure: CYSTOSCOPY WITH RETROGRADE PYELOGRAM;  Surgeon: Francisca Redell BROCKS, MD;  Location: [facility] ORS;  Service: Urology;;   CYSTOSCOPY/URETEROSCOPY/HOLMIUM LASER/STENT PLACEMENT Left 11/18/2021    Procedure: CYSTOSCOPY/URETEROSCOPY/HOLMIUM LASER/STENT PLACEMENT;  Surgeon: Francisca Redell BROCKS, MD;  Location: [facility] OR[facility];  Service: Urology;  Laterality: Left;   FRACTURE SURGERY Left    Fibula, left    FAMILY HISTORY: Family History  Problem Relation Age of Onset   Anxiety disorder Mother    Arthritis Mother    ADD / ADHD Father    Arthritis Father    Hearing loss Father    Hypertension Father    Early death Brother    Arthritis Maternal Grandmother    Asthma Maternal Grandmother     SOCIAL HISTORY: Social History   Socioeconomic History   Marital status: Married    Spouse name: Not on file   Number of children: Not on file   Years of education: Not on file   Highest education level: Associate degree: occupational, Scientist, product/process development, or vocational program  Occupational History   Not on file  Tobacco Use   Smoking status: Former    Types: Cigarettes    Passive exposure: Never   Smokeless tobacco: Former    Types: Snuff  Vaping Use   Vaping status: Former  Substance and Sexual Activity   Alcohol use: Not Currently   Drug use: Not Currently   Sexual activity: Yes    Birth control/protection: None  Other Topics Concern   Not on file  Social History Narrative   Not on file   Social Drivers of Health   Financial Resource Strain: High Risk (08/16/2023)   Overall Financial Resource Strain (CARDIA)    Difficulty of Paying Living Expenses: Very hard  Food Insecurity: Food Insecurity Present (08/16/2023)   Hunger Vital Sign    Worried About Running Out of Food in the Last Year: Often true    Ran Out of Food in the Last Year: Often true  Transportation Needs: No Transportation Needs (08/16/2023)   PRAPARE - Administrator, Civil Service (Medical): No    Lack of Transportation (Non-Medical): No  Physical Activity: Sufficiently Active (08/16/2023)   Exercise Vital Sign    Days of Exercise per Week: 7 days    Minutes of Exercise per Session: 30 min  Stress: Stress  Concern Present (08/16/2023)   [employer] of Occupational Health - Occupational Stress Questionnaire    Feeling of Stress : To some extent  Social Connections: Socially Integrated (08/16/2023)   Social Connection and Isolation Panel  Frequency of Communication with Friends and Family: Three times a week    Frequency of Social Gatherings with Friends and Family: Twice a week    Attends Religious Services: More than 4 times per year    Active Member of [employer] or Organizations: Yes    Attends Engineer, structural: More than 4 times per year    Marital Status: Married  Catering manager Violence: Not At Risk (05/04/2023)   Humiliation, Afraid, Rape, and Kick questionnaire    Fear of Current or Ex-Partner: No    Emotionally Abused: No    Physically Abused: No    Sexually Abused: No       PHYSICAL EXAM  Vitals:   01/31/24 0840  BP: (!) 79/54  Pulse: 86  SpO2: 99%  Weight: 194 lb (88 kg)  Height: 5' 9 (1.753 m)    Body mass index is 28.65 kg/m.  No results found.    General: The patient is well-developed and well-nourished and in no acute distress  HEENT:  Head is [place]/AT.  Sclera are anicteric.  Funduscopic exam shows normal optic discs and retinal vessels.  Neck: No carotid bruits are noted.    Skin: Extremities are without rash or  edema.   Neurologic Exam  Mental status: The patient is alert and oriented x 3 at the time of the examination. The patient has apparent normal recent and remote memory, with an apparently normal attention span and concentration ability.   Speech is normal.  Cranial nerves: Extraocular movements are full. Pupils are equal, round, and reactive to light and accomodation.   There is good facial sensation to soft touch bilaterally.Facial strength is normal.  Trapezius and sternocleidomastoid strength is normal. No dysarthria is noted.  The tongue is midline, and the patient has symmetric elevation of the soft palate. No obvious  hearing deficits are noted.  Motor:  Muscle bulk is normal.   Tone is normal. Strength is  5 / 5 in all 4 extremities.   Sensory: Sensation was fairly symmetric to touch, temperature and vibration in the arms.  Symmetric vibration sensation in the legs but reduced touch sensation in left leg  Coordination: Cerebellar testing reveals good finger-nose-finger and heel-to-shin bilaterally.  Gait and station: Station is normal.   Gait is normal. Tandem gait is minimally wide. Romberg is negative.   Reflexes: Deep tendon reflexes are symmetric and normal bilaterally.       DIAGNOSTIC DATA (LABS, IMAGING, TESTING) - I reviewed patient records, labs, notes, testing and imaging myself where available.  Lab Results  Component Value Date   WBC 6.2 06/01/2023   HGB 14.3 06/01/2023   HCT 42.4 06/01/2023   MCV 88.0 06/01/2023   PLT 261 06/01/2023      Component Value Date/Time   NA 142 11/12/2023 0857   K 4.9 11/12/2023 0857   CL 108 11/12/2023 0857   CO2 29 11/12/2023 0857   GLUCOSE 91 11/12/2023 0857   BUN 14 11/12/2023 0857   CREATININE 0.76 11/12/2023 0857   CALCIUM  9.7 11/12/2023 0857   PROT 6.9 11/12/2023 0857   ALBUMIN 4.6 11/12/2023 0857   AST 16 11/12/2023 0857   ALT 15 11/12/2023 0857   ALKPHOS 36 (L) 11/12/2023 0857   BILITOT 0.3 11/12/2023 0857   GFRNONAA >60 06/01/2023 1004        ASSESSMENT AND PLAN  MS (multiple sclerosis) (HCC) - Plan: MR BRAIN W WO CONTRAST  Gait disturbance - Plan: MR BRAIN W WO CONTRAST  ADHD (attention deficit hyperactivity disorder), combined type  Other fatigue  Urinary urgency  Chronic pain syndrome   He currently appears stable on Copaxone.  He did have some breakthrough activity late last year (1 enhancing focus noted on MRI).  Therefore, I have recommended that he change to a more efficacious medication.  For now he would like to stay on Copaxone.*   Because of that breakthrough activity last year we need to recheck an MRI of  the brain to determine if there has been additional breakthrough.  If present, I will get an urgent to switch to a different medication. Continue gabapentin  800 mg po bid (renew at bid next time), dronabinol .  He is also on Adderall prescribed by his PCP Rtc 6 months or sooner if new or worsening neurologic issues.   Emmalia Heyboer A. Vear, MD, [facility] 01/31/2024, 9:18 AM Certified in Neurology, Clinical Neurophysiology, Sleep Medicine and Neuroimaging  [facility] [address] [place], [place] [address] [phone]

## 2024-01-31 NOTE — Telephone Encounter (Signed)
 sent to GI they obtain [place] [facility] [phone]

## 2024-02-01 ENCOUNTER — Other Ambulatory Visit: Payer: Self-pay

## 2024-02-01 ENCOUNTER — Encounter: Payer: Self-pay | Admitting: Neurology

## 2024-02-04 ENCOUNTER — Other Ambulatory Visit: Payer: Self-pay | Admitting: Neurology

## 2024-02-04 MED ORDER — LORAZEPAM 2 MG PO TABS
ORAL_TABLET | ORAL | 0 refills | Status: DC
Start: 2024-02-04 — End: 2024-02-12

## 2024-02-11 ENCOUNTER — Ambulatory Visit: Admitting: Psychology

## 2024-02-12 ENCOUNTER — Telehealth: Payer: Self-pay

## 2024-02-12 ENCOUNTER — Other Ambulatory Visit (HOSPITAL_COMMUNITY)
Admission: RE | Admit: 2024-02-12 | Discharge: 2024-02-12 | Disposition: A | Discharge status: Home / Self Care | Source: Ambulatory Visit

## 2024-02-12 ENCOUNTER — Ambulatory Visit

## 2024-02-12 ENCOUNTER — Ambulatory Visit: Payer: Self-pay

## 2024-02-12 VITALS — BP 122/84 | HR 82 | Temp 98.2°F | Ht 69.0 in | Wt 191.6 lb

## 2024-02-12 DIAGNOSIS — R32 Unspecified urinary incontinence: Secondary | ICD-10-CM | POA: Diagnosis present

## 2024-02-12 DIAGNOSIS — E7800 Pure hypercholesterolemia, unspecified: Secondary | ICD-10-CM | POA: Diagnosis not present

## 2024-02-12 DIAGNOSIS — F1110 Opioid abuse, uncomplicated: Secondary | ICD-10-CM | POA: Diagnosis not present

## 2024-02-12 DIAGNOSIS — R300 Dysuria: Secondary | ICD-10-CM | POA: Insufficient documentation

## 2024-02-12 DIAGNOSIS — F902 Attention-deficit hyperactivity disorder, combined type: Secondary | ICD-10-CM

## 2024-02-12 LAB — COMPREHENSIVE METABOLIC PANEL WITH GFR
ALT: 17 U/L (ref 0–53)
AST: 17 U/L (ref 0–37)
Albumin: 4.9 g/dL (ref 3.5–5.2)
Alkaline Phosphatase: 42 U/L (ref 39–117)
BUN: 17 mg/dL (ref 6–23)
CO2: 29 meq/L (ref 19–32)
Calcium: 9.5 mg/dL (ref 8.4–10.5)
Chloride: 103 meq/L (ref 96–112)
Creatinine, Ser: 0.83 mg/dL (ref 0.40–1.50)
GFR: 115.73 mL/min (ref >60.00)
Glucose, Bld: 93 mg/dL (ref 70–99)
Potassium: 4.4 meq/L (ref 3.5–5.1)
Sodium: 140 meq/L (ref 135–145)
Total Bilirubin: 0.5 mg/dL (ref 0.2–1.2)
Total Protein: 6.9 g/dL (ref 6.0–8.3)

## 2024-02-12 LAB — URINALYSIS, ROUTINE W REFLEX MICROSCOPIC
Bilirubin Urine: NEGATIVE
Hgb urine dipstick: NEGATIVE
Ketones, ur: NEGATIVE
Leukocytes,Ua: NEGATIVE
Nitrite: NEGATIVE
RBC / HPF: NONE SEEN (ref 0–?)
Specific Gravity, Urine: 1.01 (ref 1.000–1.030)
Total Protein, Urine: NEGATIVE
Urine Glucose: NEGATIVE
Urobilinogen, UA: 0.2 (ref 0.0–1.0)
WBC, UA: NONE SEEN (ref 0–?)
pH: 8 (ref 5.0–8.0)

## 2024-02-12 LAB — LIPID PANEL
Cholesterol: 238 mg/dL — ABNORMAL HIGH (ref 0–200)
HDL: 51 mg/dL (ref >39.00)
LDL Cholesterol: 163 mg/dL — ABNORMAL HIGH (ref 0–99)
NonHDL: 186.89
Total CHOL/HDL Ratio: 5
Triglycerides: 118 mg/dL (ref 0.0–149.0)
VLDL: 23.6 mg/dL (ref 0.0–40.0)

## 2024-02-12 NOTE — Assessment & Plan Note (Signed)
 On Adderall 10 mg which he takes during work week without side effects. PDMP reviewed. Continue.

## 2024-02-12 NOTE — Telephone Encounter (Unsigned)
 Copied from CRM [id number]. Topic: Clinical - Request for Lab/Test Order >> Feb 12, 2024  4:10 PM [facility] wrote: Reason for CRM: [facility] [phone] called and stated the specimen is fine but the lab order is wrong. They would need to the correct lab order to process.

## 2024-02-12 NOTE — Telephone Encounter (Signed)
 Spoke with representative from [facility]. Rep informed me the order Dr Abbey placed earlier for the patient for [facility] order was placed incorrectly. Asked if we could correct the order to [id number]. Order was corrected. Rep verbalized they have received it.

## 2024-02-12 NOTE — Assessment & Plan Note (Signed)
-   Urinary symptoms with history of nephrolithiasis, pyelonephritis and multiple sclerosis-associated urinary incontinence. - Intermittent stinging and malodorous urine. - Differential includes UTI, STIs, MS-related urinary symptoms . - Order urinalysis and urine culture, screening for chlamydia, gonorrhea, CMP.  - Treat with antibiotics if UTI confirmed. - Advise urologist follow-up if urine negative for infection. Also counseled patient to update his neurologist about current symptoms.

## 2024-02-12 NOTE — Progress Notes (Signed)
 Established Patient Office Visit   Subjective  Patient ID: Damon Blair, male    DOB: Oct 21, 1990  Age: 33 y.o. MRN: [id number]  Chief Complaint  Patient presents with   ADHD   Dysuria    He  has a past medical history of Acute pyelonephritis (11/15/2021), Anxiety, Arthritis, Influenza A (09/17/2023), Kidney stones, MS (multiple sclerosis) (HCC), and Neuromuscular disorder (HCC).  HPI Discussed the use of AI scribe software for clinical note transcription with the patient, who gave verbal consent to proceed.  History of Present Illness Damon Blair is a 33 year old male with multiple sclerosis who presents with urinary symptoms.  - He has been experiencing a stinging sensation and a foul odor in his urine since Saturday. He also notes difficulty with the final urination, which he attributes to his multiple sclerosis-related urinary incontinence. He describes dribbling at the end of urination and uses a lightweight pad to manage this. No fever, chills, or lower back pain similar to previous kidney stone episodes, but there is intermittent tenderness on the left side, where he previously had kidney stones.   - ADHD:He is currently taking Adderall 10 mg once daily, primarily on weekdays for concentration and fatigue, with breaks on weekends or when not needed.   - He follows up with neurologist for management of MS.   - He is currently taking Tylenol  650 mg BID for polyarthritis.   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   -He maintains a diet high in raw vegetables and fiber, which he believes affects the smell of his urine. He drinks 50-60 ounces of water daily and avoids sodas, occasionally consuming juice in smoothies.  ROS As per HPI    Objective:     BP 122/84 (BP Location: Right Arm, Patient Position: Sitting, Cuff Size: Normal)   Pulse 82   Temp 98.2 F (36.8 C) (Oral)   Ht 5'  9 (1.753 m)   Wt 191 lb 9.6 oz (86.9 kg)   SpO2 97%   BMI 28.29 kg/m      02/12/2024    8:21 AM 01/07/2024    9:36 AM 11/12/2023    8:11 AM  Depression screen PHQ 2/9  Decreased Interest 1 1 1   Down, Depressed, Hopeless 0 0 0  PHQ - 2 Score 1 1 1   Altered sleeping 1  1  Tired, decreased energy 2  2  Change in appetite 0  0  Feeling bad or failure about yourself  0  0  Trouble concentrating 1  1  Moving slowly or fidgety/restless 1  1  Suicidal thoughts 0  0  PHQ-9 Score 6  6  Difficult doing work/chores Somewhat difficult  Somewhat difficult      02/12/2024    8:21 AM 11/12/2023    8:12 AM 09/17/2023   11:24 AM 09/14/2023    9:42 AM  GAD 7 : Generalized Anxiety Score  Nervous, Anxious, on Edge 1 1 0 2  Control/stop worrying 1 1 0 1  Worry too much - different things 0 1 0 1  Trouble relaxing 1 1 0 2  Restless 1 1 0 1  Easily annoyed or irritable 1 1 0 1  Afraid - awful might happen 1 0 0 1  Total GAD 7 Score 6 6 0 9  Anxiety Difficulty Somewhat difficult Somewhat difficult Not difficult at all Somewhat difficult  02/12/2024    8:21 AM 01/07/2024    9:36 AM 11/12/2023    8:11 AM  Depression screen PHQ 2/9  Decreased Interest 1 1 1   Down, Depressed, Hopeless 0 0 0  PHQ - 2 Score 1 1 1   Altered sleeping 1  1  Tired, decreased energy 2  2  Change in appetite 0  0  Feeling bad or failure about yourself  0  0  Trouble concentrating 1  1  Moving slowly or fidgety/restless 1  1  Suicidal thoughts 0  0  PHQ-9 Score 6  6  Difficult doing work/chores Somewhat difficult  Somewhat difficult      02/12/2024    8:21 AM 11/12/2023    8:12 AM 09/17/2023   11:24 AM 09/14/2023    9:42 AM  GAD 7 : Generalized Anxiety Score  Nervous, Anxious, on Edge 1 1 0 2  Control/stop worrying 1 1 0 1  Worry too much - different things 0 1 0 1  Trouble relaxing 1 1 0 2  Restless 1 1 0 1  Easily annoyed or irritable 1 1 0 1  Afraid - awful might happen 1 0 0 1  Total GAD 7 Score 6 6 0 9   Anxiety Difficulty Somewhat difficult Somewhat difficult Not difficult at all Somewhat difficult   SDOH Screenings   Food Insecurity: Food Insecurity Present (08/16/2023)  Housing: High Risk (08/16/2023)  Transportation Needs: No Transportation Needs (08/16/2023)  Utilities: Not At Risk (05/04/2023)  Depression (PHQ2-9): Medium Risk (02/12/2024)  Financial Resource Strain: High Risk (08/16/2023)  Physical Activity: Sufficiently Active (08/16/2023)  Social Connections: Socially Integrated (08/16/2023)  Stress: Stress Concern Present (08/16/2023)  Tobacco Use: Medium Risk (02/12/2024)     Physical Exam Constitutional:      Appearance: Normal appearance.  HENT:     Head: Normocephalic and atraumatic.     Mouth/Throat:     Mouth: Mucous membranes are moist.     Pharynx: No oropharyngeal exudate.  Neck:     Thyroid : No thyroid  mass or thyroid  tenderness.  Cardiovascular:     Rate and Rhythm: Normal rate and regular rhythm.  Pulmonary:     Effort: Pulmonary effort is normal.     Breath sounds: Normal breath sounds.  Abdominal:     General: Bowel sounds are normal.     Palpations: Abdomen is soft.     Tenderness: There is abdominal tenderness (suprapubic tenderness on deep palpation). There is no right CVA tenderness or left CVA tenderness.  Musculoskeletal:     Cervical back: Neck supple. No rigidity.     Right lower leg: No edema.     Left lower leg: No edema.  Skin:    General: Skin is warm.  Neurological:     Mental Status: He is alert and oriented to person, place, and time.  Psychiatric:        Mood and Affect: Mood normal.        Behavior: Behavior normal.        No results found for any visits on 02/12/24.  The ASCVD Risk score (Arnett DK, et al., 2019) failed to calculate for the following reasons:   The 2019 ASCVD risk score is only valid for ages 22 to 109     Assessment & Plan:   Dysuria Assessment & Plan: - Urinary symptoms with history of  nephrolithiasis, pyelonephritis and multiple sclerosis-associated urinary incontinence. - Intermittent stinging and malodorous urine. - Differential includes UTI, STIs, MS-related urinary symptoms . -  Order urinalysis and urine culture, screening for chlamydia, gonorrhea, CMP.  - Treat with antibiotics if UTI confirmed. - Advise urologist follow-up if urine negative for infection. Also counseled patient to update his neurologist about current symptoms.    Orders: -     Urinalysis, Routine w reflex microscopic -     Urine Culture -     Comprehensive metabolic panel with GFR -     Chlamydia/NGC rt PCR ([facility] only)  ADHD (attention deficit hyperactivity disorder), combined type Assessment & Plan: On Adderall 10 mg which he takes during work week without side effects. PDMP reviewed. Continue.    Elevated LDL cholesterol level Assessment & Plan: Patient is fasting today, repeat fasting lipid panel. Recommend continuing with daily moderate intensity exercise for 30 min each day for 5 days a week or 150 min total per week, continue healthy diet. Previous statin therapy caused pain. Managed with lifestyle changes.  Orders: -     Lipid panel  Heroin abuse (HCC) Assessment & Plan: Continue support for sobriety, in remission.    Urinary incontinence, unspecified type Assessment & Plan: Likely from MS. Denies worsening symptoms now. Has seen urologist in the past.    I spent 40 minutes on the day of this face-to-face encounter reviewing the patient's current medications, ongoing concerns, and reviewing the assessment and plan with the patient. This time also included counseling the patient on their health conditions and management options. Additionally, I spent time post-visit ordering and reviewing diagnostics and therapeutics with the patient.   Return for 6 months for ADHD, chronic f/u.   Luke Shade, MD

## 2024-02-12 NOTE — Assessment & Plan Note (Addendum)
 Patient is fasting today, repeat fasting lipid panel. Recommend continuing with daily moderate intensity exercise for 30 min each day for 5 days a week or 150 min total per week, continue healthy diet. Previous statin therapy caused pain. Managed with lifestyle changes.

## 2024-02-12 NOTE — Assessment & Plan Note (Addendum)
 Continue support for sobriety, in remission.

## 2024-02-12 NOTE — Addendum Note (Signed)
 Addended by: ANICE BELT on: 02/12/2024 04:15 PM   Modules accepted: Orders

## 2024-02-12 NOTE — Assessment & Plan Note (Addendum)
 Likely from MS. Denies worsening symptoms now. Has seen urologist in the past.

## 2024-02-13 DIAGNOSIS — E7800 Pure hypercholesterolemia, unspecified: Secondary | ICD-10-CM

## 2024-02-13 LAB — URINE CULTURE
MICRO NUMBER:: 16759446
Result:: NO GROWTH
SPECIMEN QUALITY:: ADEQUATE

## 2024-02-13 LAB — URINE CYTOLOGY ANCILLARY ONLY
Chlamydia: NEGATIVE
Comment: NEGATIVE
Comment: NORMAL
Neisseria Gonorrhea: NEGATIVE

## 2024-02-13 MED ORDER — ATORVASTATIN CALCIUM 20 MG PO TABS: 20.0000 mg | ORAL_TABLET | Freq: Every day | ORAL | 3 refills | Status: DC

## 2024-02-14 ENCOUNTER — Telehealth: Payer: Self-pay

## 2024-02-14 NOTE — Telephone Encounter (Signed)
 LM for patient to all for pre virtual appt questions.

## 2024-02-15 ENCOUNTER — Ambulatory Visit
Admission: RE | Admit: 2024-02-15 | Discharge: 2024-02-15 | Disposition: A | Discharge status: Home / Self Care | Source: Ambulatory Visit | Attending: Neurology | Admitting: Neurology

## 2024-02-15 ENCOUNTER — Ambulatory Visit: Payer: Self-pay | Admitting: Neurology

## 2024-02-15 DIAGNOSIS — G35 Multiple sclerosis: Secondary | ICD-10-CM

## 2024-02-15 DIAGNOSIS — R269 Unspecified abnormalities of gait and mobility: Secondary | ICD-10-CM

## 2024-02-15 MED ORDER — GADOPICLENOL 0.5 MMOL/ML IV SOLN
9.0000 mL | Freq: Once | INTRAVENOUS | Status: AC | PRN
  Administered 2024-02-15: 9 mL via INTRAVENOUS

## 2024-02-18 ENCOUNTER — Other Ambulatory Visit: Payer: Self-pay | Admitting: Nurse Practitioner

## 2024-02-18 ENCOUNTER — Ambulatory Visit
Attending: Student in an Organized Health Care Education/Training Program | Admitting: Student in an Organized Health Care Education/Training Program

## 2024-02-18 DIAGNOSIS — M5020 Other cervical disc displacement, unspecified cervical region: Secondary | ICD-10-CM

## 2024-02-18 DIAGNOSIS — M4802 Spinal stenosis, cervical region: Secondary | ICD-10-CM | POA: Diagnosis not present

## 2024-02-18 DIAGNOSIS — M5412 Radiculopathy, cervical region: Secondary | ICD-10-CM

## 2024-02-18 NOTE — Progress Notes (Signed)
 PROVIDER NOTE: Interpretation of information contained herein should be left to medically-trained personnel. Specific patient instructions are provided elsewhere under Patient Instructions section of medical record. This document was created in part using AI and STT-dictation technology, any transcriptional errors that may result from this process are unintentional.  Patient: Damon Blair  Service: E/M   PCP: Damon Bruckner, MD  DOB: 1991/01/28  DOS: 02/18/2024  Provider: Wallie Sherry, MD  MRN: [id number]  Delivery: Virtual Visit  Specialty: Interventional Pain Management  Type: Established Patient  Setting: Ambulatory outpatient facility  Specialty designation: 09  Referring Prov.: Damon Blair, Bruckner, MD  Location: Remote location       Virtual Encounter - Pain Management PROVIDER NOTE: Information contained herein reflects review and annotations entered in association with encounter. Interpretation of such information and data should be left to medically-trained personnel. Information provided to patient can be located elsewhere in the medical record under Patient Instructions. Document created using STT-dictation technology, any transcriptional errors that may result from process are unintentional.    Contact & Pharmacy Preferred: [phone] Home: [phone] (home) Mobile: [phone] (mobile) E-mail: [email or web address]  [id number] [place] [place] - [address] [place] [address] Phone: [phone] Fax: [phone]  [facility] - [place], [place] - [address] [facility] [address] Damon Blair [place] [address] Phone: [phone] Fax: [phone]  [id number] [place], [place] - [address] [place] [address] Phone: [phone] Fax: [phone]   Pre-screening  Damon Blair offered in-person vs virtual encounter. He indicated preferring virtual for this encounter.   Reason COVID-19*  Social  distancing based on CDC and AMA recommendations.   I contacted Damon Blair on 02/18/2024 via telephone.      I clearly identified myself as Damon Sherry, MD. I verified that I was speaking with the correct person using two identifiers (Name: Damon Blair, and date of birth: [birth year]Damon Blair).  Consent I sought verbal advanced consent from Damon Blair for virtual visit interactions. I informed Damon Blair of possible security and privacy concerns, risks, and limitations associated with providing not-in-person medical evaluation and management services. I also informed Damon Blair of the availability of in-person appointments. Finally, I informed him that there would be a charge for the virtual visit and that he could be  personally, fully or partially, financially responsible for it. Damon Blair expressed understanding and agreed to proceed.   Historic Elements   Damon Blair is a 33 y.o. year old, male patient evaluated today after our last contact on 01/07/2024. Damon Blair  has a past medical history of Acute pyelonephritis (11/15/2021), Anxiety, Arthritis, Hydronephrosis with urinary obstruction due to ureteral calculus (11/15/2021), Influenza A (09/17/2023), Kidney stones, MS (multiple sclerosis) (HCC), Neuromuscular disorder (HCC), and Pyelonephritis of left kidney (11/15/2021). He also  has a past surgical history that includes Fracture surgery (Left); Cystoscopy/ureteroscopy/holmium laser/stent placement (Left, 11/18/2021); and Cystoscopy w/ retrogrades (11/18/2021). Damon Blair has a current medication list which includes the following prescription(s): acetaminophen , amphetamine -dextroamphetamine , atorvastatin , baclofen , vitamin d -3, cyanocobalamin , diazepam , dronabinol , gabapentin , glatopa , imiquimod , naproxen , fish oil, and cetirizine . He  reports that he has quit smoking. His smoking use included cigarettes. He has never been exposed to tobacco smoke.  He has quit using smokeless tobacco.  His smokeless tobacco use included snuff. He reports that he does not currently use alcohol. He reports that he does not currently use drugs. Damon Blair is allergic to clavulanic acid, dilaudid  [hydromorphone ], oxycodone -acetaminophen , and oxycodone .  BMI: Estimated  body mass index is 28.29 kg/m as calculated from the following:   Height as of 02/12/24: 5' 9 (1.753 m).   Weight as of 02/12/24: 191 lb 9.6 oz (86.9 kg). Last encounter: 12/11/2023. Last procedure: 01/07/2024.  HPI  Today, he is being contacted for   Post-Procedure Evaluation   Procedure: Cervical Epidural Steroid injection (CESI) (Interlaminar) #2  Laterality: Midline  Level: C7-T1 Imaging: Fluoroscopy-assisted DOS: 01/07/2024  Performed by: Damon Sherry, MD Anesthesia: Local anesthesia (1-2% Lidocaine ) Sedation: Minimal Sedation                         Purpose: Diagnostic/Therapeutic Indications: Cervicalgia, cervical radicular pain, degenerative disc disease, severe enough to impact quality of life or function. 1. Cervical radicular pain   2. Protrusion of cervical intervertebral disc   3. Cervical stenosis of spine    NAS-11 score:   Pre-procedure: 7 /10   Post-procedure: 6 /10     Effectiveness:  Initial hour after procedure: 60 %  Subsequent 4-6 hours post-procedure: 60 %  Analgesia past initial 6 hours: 80 % (patient feels that he is experiencing 70 - 80 % relief dependant on acitivity levels.  very happy with relief.)  Ongoing improvement:  Analgesic:  80% Function: Somewhat improved ROM: Somewhat improved   UDS:  Summary  Date Value Ref Range Status  07/12/2023 FINAL  Corrected    Comment:    ==================================================================== [employer] Select 13 (MW) ==================================================================== Specimen Alert A duplicate report has been generated due to demographic  updates. ==================================================================== Test                             Result       Flag       Units  Drug Present and Declared for Prescription Verification   Amphetamine                     914          EXPECTED   ng/mg creat    Amphetamine  is available as a schedule II prescription drug.    Desmethyldiazepam              221          EXPECTED   ng/mg creat   Oxazepam                       796          EXPECTED   ng/mg creat   Temazepam                      424          EXPECTED   ng/mg creat    Desmethyldiazepam, oxazepam, and temazepam are expected metabolites    of diazepam . Desmethyldiazepam and oxazepam are also expected    metabolites of other drugs, including chlordiazepoxide, prazepam,    clorazepate, and halazepam. Oxazepam is an expected metabolite of    temazepam. Oxazepam and temazepam are also available as scheduled    prescription medications.    Carboxy-THC                    >1389        EXPECTED   ng/mg creat    Carboxy-THC is a metabolite of tetrahydrocannabinol (THC). Source of    THC is most commonly herbal marijuana or marijuana-based products,  but THC is also present in a scheduled prescription medication.    Trace amounts of THC can be present in hemp and cannabidiol (CBD)    products. This test is not intended to distinguish between delta-9-    tetrahydrocannabinol, the predominant form of THC in most herbal or    marijuana-based products, and delta-8-tetrahydrocannabinol.  Drug Present not Declared for Prescription Verification   Alprazolam                     115          UNEXPECTED ng/mg creat   Alpha-hydroxyalprazolam        190          UNEXPECTED ng/mg creat    Source of alprazolam is a scheduled prescription medication. Alpha-    hydroxyalprazolam is an expected metabolite of alprazolam.  ==================================================================== Test                      Result    Flag   Units       Ref Range   Creatinine              72               mg/dL      >=79 ==================================================================== Declared Medications:  The flagging and interpretation on this report are based on the  following declared medications.  Unexpected results may arise from  inaccuracies in the declared medications.   **Note: The testing scope of this panel includes these medications:   Amphetamine  (Adderall)  Diazepam  (Valium )  Tetrahydrocannabinol (Marinol )   **Note: The testing scope of this panel does not include the  following reported medications:   Acetaminophen  (Tylenol )  Baclofen  (Lioresal )  Cetirizine  (Zyrtec )  Fish Oil  Gabapentin  (Neurontin )  Glatiramer  (Glatopa )  Indomethacin  (Indocin )  Naproxen  (Naprosyn )  Rosuvastatin  (Crestor )  Vitamin B12  Vitamin D3 ==================================================================== For clinical consultation, please call [phone]. ====================================================================    No results found for: MABLE OYSTER, [id number]  Laboratory Chemistry Profile   Renal Lab Results  Component Value Date   BUN 17 02/12/2024   CREATININE 0.83 02/12/2024   GFR 115.73 02/12/2024   GFRNONAA >60 06/01/2023    Hepatic Lab Results  Component Value Date   AST 17 02/12/2024   ALT 17 02/12/2024   ALBUMIN 4.9 02/12/2024   ALKPHOS 42 02/12/2024   LIPASE 34 01/20/2022    Electrolytes Lab Results  Component Value Date   NA 140 02/12/2024   K 4.4 02/12/2024   CL 103 02/12/2024   CALCIUM  9.5 02/12/2024   MG 1.7 05/08/2023    Bone Lab Results  Component Value Date   VD25OH 31.80 07/16/2023    Inflammation (CRP: Acute Phase) (ESR: Chronic Phase) Lab Results  Component Value Date   LATICACIDVEN 0.7 11/15/2021         Note: Above Lab results reviewed.  Imaging  MR BRAIN W WO CONTRAST Narrative:   [facility] [address] [place], [place] [address] [phone]  NEUROIMAGING REPORT  STUDY DATE: 02/15/2024 PATIENT NAME: Kirubel Aja DOB: Mar 13, 1991 MRN: [id number]  EXAM: MRI Brain with and without contrast  ORDERING CLINICIAN: Richard A. Sater, MD. PhD CLINICAL HISTORY: 34 year old man with multiple sclerosis COMPARISON FILMS: 05/04/2023  TECHNIQUE:MRI of the brain with and without contrast was obtained  utilizing 5 mm axial slices with T1, T2, T2 flair, SWI and diffusion  weighted views.  T1 sagittal, T2 coronal and  postcontrast views in the  axial and coronal plane were obtained.   CONTRAST: 9 ml Vueway  IMAGING SITE: [facility], [address] [place], [place], [place]   FINDINGS: On sagittal images, the spinal cord is imaged caudally to C4 and  is normal in caliber.   The contents of the posterior fossa are of normal  size and position.   The pituitary gland and optic chiasm appear normal.     Brain volume appears normal.   The ventricles are normal in size and  without distortion.  There are no abnormal extra-axial collections of  fluid.    There are several T2/FLAIR hyperintense foci in the cerebral hemispheres,  1 in the periventricular white matter and the rest in the deep white  matter.  1 focus in the left basal ganglia has an appearance more  consistent with a chronic lacune.    Additionally there is a small focus  in the left middle cerebellar peduncle and possibly a small focus in the  anterior left medulla.  None of the foci enhance or appear to be acute.   No new lesions compared to the MRI from 05/04/2023.  Diffusion weighted  images are normal.  Susceptibility weighted images are normal.     The orbits appear normal.   The VIIth/VIIIth nerve complex appears normal.   The mastoid air cells appear normal.  The paranasal sinuses appear  normal.  Flow voids are identified within the major intracerebral  arteries.    After the infusion of contrast material, a normal  enhancement pattern is  noted. Impression: This MRI of the brain with and without contrast shows the  following: Several T2/FLAIR hyperintense foci in the cerebral hemispheres and 2 small  foci in the left middle cerebellar peduncle and left medulla.  Some of  these are consistent with chronic demyelinating plaque associated with  multiple sclerosis.  The focus in the left globus pallidus has an  appearance more consistent with a chronic lacune.  None of the foci  enhance.  Compared to the MRI from 05/04/2023, there are no new lesions. No acute findings.  Normal enhancement pattern.  INTERPRETING PHYSICIAN:  Richard A. Vear, MD, PhD, FAAN Certified in  Neuroimaging by [employer]     Assessment  The primary encounter diagnosis was Cervical radicular pain. Diagnoses of Protrusion of cervical intervertebral disc and Cervical stenosis of spine were also pertinent to this visit.  Plan of Care  Excellent response after C-ESI States that pain is better controlled and has noticed improvement in functional status Repeat PRN  Follow-up plan:   Return for PRN.      Left C-ESI 07/16/23, 01/07/24    Recent Visits Date Type Provider Dept  01/07/24 Procedure visit Marcelino Nurse, MD [facility]  12/11/23 Office Visit Marcelino Nurse, MD [facility]  Showing recent visits within past 90 days and meeting all other requirements Today's Visits Date Type Provider Dept  02/18/24 Office Visit Marcelino Nurse, MD [facility]  Showing today's visits and meeting all other requirements Future Appointments No visits were found meeting these conditions. Showing future appointments within next 90 days and meeting all other requirements  I discussed the assessment and treatment plan with the patient. The patient was provided an opportunity to ask questions and all were answered. The patient agreed with the plan and demonstrated an understanding of the  instructions.  Patient advised to call back or seek an in-person evaluation if the symptoms or condition worsens.  Duration of encounter: .  Note by: Damon Sherry, MD Date: 02/18/2024; Time: 3:14 PM

## 2024-02-19 ENCOUNTER — Telehealth: Payer: Self-pay | Admitting: *Deleted

## 2024-03-08 ENCOUNTER — Other Ambulatory Visit: Payer: Self-pay | Admitting: Neurology

## 2024-03-11 NOTE — Telephone Encounter (Signed)
 Last seen 01/31/24 and next f/u 08/21/24. Last refilled 01/05/24 #60.

## 2024-03-17 ENCOUNTER — Other Ambulatory Visit: Payer: Self-pay | Admitting: Neurology

## 2024-03-18 NOTE — Telephone Encounter (Signed)
 Last seen on 01/31/24 Follow up scheduled on 08/21/24   Dispensed Days Supply Quantity Provider Pharmacy  DIAZEPAM  5 MG TABLET 02/17/2024 30 30 each Sater, Charlie LABOR, MD [facility] [phone] [facility] -      Rx pending to be signed

## 2024-04-08 ENCOUNTER — Telehealth: Payer: Self-pay | Admitting: Student in an Organized Health Care Education/Training Program

## 2024-04-08 NOTE — Telephone Encounter (Signed)
 Patient wants to go ahead with the epid ordered by Dr Marcelino on his last visit 02-18-24. He has [place] and will need [place]

## 2024-04-10 ENCOUNTER — Ambulatory Visit

## 2024-04-10 VITALS — BP 118/84 | HR 76 | Temp 98.3°F | Ht 69.0 in | Wt 190.0 lb

## 2024-04-10 DIAGNOSIS — A6002 Herpesviral infection of other male genital organs: Secondary | ICD-10-CM | POA: Diagnosis not present

## 2024-04-10 DIAGNOSIS — A6000 Herpesviral infection of urogenital system, unspecified: Secondary | ICD-10-CM | POA: Insufficient documentation

## 2024-04-10 MED ORDER — VALACYCLOVIR HCL 1 G PO TABS: ORAL_TABLET | ORAL | 3 refills | Status: AC

## 2024-04-10 NOTE — Telephone Encounter (Signed)
 Okay to schedule office visit or virtual visit for today for 4:30.  Luke Shade, MD

## 2024-04-10 NOTE — Progress Notes (Signed)
 Acute Office Visit  Subjective:    Patient ID: Damon Blair, male    DOB: 09/15/1990, 33 y.o.   MRN: [id number]  Chief Complaint  Patient presents with   Rectal Pain    Feels like he is having a herpes outbreak.      Patient is in today for following acute concern: HPI Discussed the use of AI scribe software for clinical note transcription with the patient, who gave verbal consent to proceed.  History of Present Illness Candice Lunney is a 33 year old male with multiple sclerosis who presents with a rash near the tailbone.  He has experienced a rash near his tailbone for the past four days, described as 'little tiny minor cuts' with lesions. The rash is not located on the anus but closer to the tailbone. He has noticed some bleeding when wiping. The rash causes stinging, especially when in contact with soap.  He has a history of genital area, with the last flare-up occurring over two years ago. Previously treated with Valtrex  for but he does not have prescription handy at this time.    He reports high pain levels in his neck and ocular disturbances, which he attributes to his multiple sclerosis. He is scheduled for an epidural injection in October.  The rash is not present in his mouth, no fever, chills.    ROS As per HPI    Objective:    BP 118/84   Pulse 76   Temp 98.3 F (36.8 C) (Oral)   Ht 5' 9 (1.753 m)   Wt 190 lb (86.2 kg)   SpO2 98%   BMI 28.06 kg/m    Physical Exam Exam conducted with a chaperone present.  Constitutional:      General: He is not in acute distress. HENT:     Head: Normocephalic and atraumatic.     Mouth/Throat:     Mouth: Mucous membranes are moist.     Pharynx: Oropharynx is clear. No oropharyngeal exudate or posterior oropharyngeal erythema.  Cardiovascular:     Rate and Rhythm: Normal rate.  Pulmonary:     Effort: Pulmonary effort is normal.     Breath sounds: Normal breath sounds.  Genitourinary:    Comments: Skin: In  the intergluteal cleft, there is mild erythema without evidence of rash, ulceration, or excoriation. No obvious herpetic lesions are noted. No vesicles, pustules, or erosions observed. Surrounding skin is intact without drainage or signs of secondary infection. No tenderness to palpation.  Neurological:     Mental Status: He is alert.  Psychiatric:        Mood and Affect: Mood normal.     No results found for any visits on 04/10/24.     Assessment & Plan:   Herpes simplex infection of other site of male genital organ Assessment & Plan: Recurrent genital herpes simplex  Immunocompromised due to MS, increasing risk of herpes flare-ups. Discussed importance of treating herpes at onset to reduce viral load. However, rash today is not suggestive to be herpetic thus Valtrex  not recommended at this time. Advise using Vaseline on irritated skin to protect moisture and avoid harsh wiping.  For future flare up: Prescribe Valtrex  1 gram twice daily for 7-10 days for future flare-ups. Send prescription to pharmacy for prophylactic use, with refills available.   Orders: -     valACYclovir  HCl; Take 1000 mg, twice a day for 10 days at the onset of rash.  Dispense: 20 tablet; Refill: 3    Return  if symptoms worsen or fail to improve.  Luke Shade, MD

## 2024-04-10 NOTE — Telephone Encounter (Signed)
 Left message that appoinment has been scheduled for today at 4:30 per Dr Abbey.

## 2024-04-10 NOTE — Assessment & Plan Note (Signed)
 Recurrent genital herpes simplex  Immunocompromised due to MS, increasing risk of herpes flare-ups. Discussed importance of treating herpes at onset to reduce viral load. However, rash today is not suggestive to be herpetic thus Valtrex  not recommended at this time. Advise using Vaseline on irritated skin to protect moisture and avoid harsh wiping.  For future flare up: Prescribe Valtrex  1 gram twice daily for 7-10 days for future flare-ups. Send prescription to pharmacy for prophylactic use, with refills available.

## 2024-04-18 DIAGNOSIS — F902 Attention-deficit hyperactivity disorder, combined type: Secondary | ICD-10-CM

## 2024-04-20 MED ORDER — AMPHETAMINE-DEXTROAMPHETAMINE 10 MG PO TABS: 10.0000 mg | ORAL_TABLET | Freq: Every day | ORAL | 0 refills | Status: DC

## 2024-04-20 NOTE — Telephone Encounter (Signed)
 1. ADHD (attention deficit hyperactivity disorder), combined type - PDMP reviewed, last refill for Adderall sent on 03/09/2024 (30 tab, for one month). Refill appropriate.  - amphetamine -dextroamphetamine  (ADDERALL) 10 MG tablet; Take 1 tablet (10 mg total) by mouth daily.  Dispense: 30 tablet; Refill: 0  Luke Shade, MD

## 2024-04-23 ENCOUNTER — Other Ambulatory Visit: Payer: Self-pay | Admitting: Neurology

## 2024-04-23 ENCOUNTER — Encounter: Payer: Self-pay | Admitting: Neurology

## 2024-04-23 DIAGNOSIS — G35D Multiple sclerosis, unspecified: Secondary | ICD-10-CM

## 2024-04-23 MED ORDER — GABAPENTIN 800 MG PO TABS: 800.0000 mg | ORAL_TABLET | Freq: Two times a day (BID) | ORAL | 3 refills | Status: AC

## 2024-04-23 NOTE — Telephone Encounter (Signed)
 Gabapentin  800mg  was written for BID in 04/2023. Every rx since then has stated TID.  Please confirm. Thank you!

## 2024-04-24 NOTE — Telephone Encounter (Signed)
 Called [facility] and spoke w/ Beverley. Confirmed they have refill on file and scheduled to be delivered 04/29/24 for 28 days supply. Confirmed they are using different rx under another provider/ Dr. Jasmine.  Relayed pt transferred care under Dr. Vear and should be using rx under Dr. Duncan name. I asked them to see if they can locate rx sent by Dr. Vear 07/05/23 qty 36ml, 4 refills. He placed me on hold to review. He spoke w/pharmacist who cx rx on file for Dr. Jasmine. They found rx Dr. Vear sent previously will get rx ready under Dr. Vear and contact pt within the next 24 hours to schedule shipment.  Total time of call: 21 min

## 2024-05-05 ENCOUNTER — Ambulatory Visit
Admission: RE | Admit: 2024-05-05 | Discharge: 2024-05-05 | Disposition: A | Discharge status: Home / Self Care | Source: Ambulatory Visit | Attending: Student in an Organized Health Care Education/Training Program | Admitting: Student in an Organized Health Care Education/Training Program

## 2024-05-05 ENCOUNTER — Ambulatory Visit (HOSPITAL_BASED_OUTPATIENT_CLINIC_OR_DEPARTMENT_OTHER): Admitting: Student in an Organized Health Care Education/Training Program

## 2024-05-05 DIAGNOSIS — M4802 Spinal stenosis, cervical region: Secondary | ICD-10-CM

## 2024-05-05 DIAGNOSIS — M5412 Radiculopathy, cervical region: Secondary | ICD-10-CM | POA: Insufficient documentation

## 2024-05-05 DIAGNOSIS — M5020 Other cervical disc displacement, unspecified cervical region: Secondary | ICD-10-CM | POA: Diagnosis present

## 2024-05-05 MED ORDER — DIAZEPAM 5 MG PO TABS
ORAL_TABLET | ORAL | Status: AC
  Filled 2024-05-05: qty 1

## 2024-05-05 MED ORDER — LIDOCAINE HCL 2 % IJ SOLN
20.0000 mL | Freq: Once | INTRAMUSCULAR | Status: AC
  Administered 2024-05-05: 100 mg
  Filled 2024-05-05: qty 40

## 2024-05-05 MED ORDER — ROPIVACAINE HCL 2 MG/ML IJ SOLN
1.0000 mL | Freq: Once | INTRAMUSCULAR | Status: AC
  Administered 2024-05-05: 1 mL via EPIDURAL
  Filled 2024-05-05: qty 20

## 2024-05-05 MED ORDER — SODIUM CHLORIDE 0.9% FLUSH
1.0000 mL | Freq: Once | INTRAVENOUS | Status: AC
  Administered 2024-05-05: 1 mL

## 2024-05-05 MED ORDER — IOHEXOL 180 MG/ML  SOLN
10.0000 mL | Freq: Once | INTRAMUSCULAR | Status: AC
  Administered 2024-05-05: 10 mL via EPIDURAL
  Filled 2024-05-05: qty 20

## 2024-05-05 MED ORDER — DIAZEPAM 5 MG PO TABS
5.0000 mg | ORAL_TABLET | ORAL | Status: AC
  Administered 2024-05-05: 5 mg via ORAL

## 2024-05-05 MED ORDER — DEXAMETHASONE SOD PHOSPHATE PF 10 MG/ML IJ SOLN
10.0000 mg | Freq: Once | INTRAMUSCULAR | Status: AC
  Administered 2024-05-05: 10 mg

## 2024-05-05 NOTE — Progress Notes (Signed)
 PROVIDER NOTE: Interpretation of information contained herein should be left to medically-trained personnel. Specific patient instructions are provided elsewhere under Patient Instructions section of medical record. This document was created in part using STT-dictation technology, any transcriptional errors that may result from this process are unintentional.  Patient: Damon Blair Type: Established DOB: 1991/03/05 MRN: [id number] PCP: Abbey Bruckner, MD  Service: Procedure DOS: 05/05/2024 Setting: Ambulatory Location: Ambulatory outpatient facility Delivery: Face-to-face Provider: Wallie Sherry, MD Specialty: Interventional Pain Management Specialty designation: 09 Location: Outpatient facility Ref. Prov.: Sherry Wallie, MD       Interventional Therapy   Procedure: Cervical Epidural Steroid injection (CESI) (Interlaminar) #3  Laterality: Midline  Level: C7-T1 Imaging: Fluoroscopy-assisted DOS: 05/05/2024  Performed by: Wallie Sherry, MD Anesthesia: Local anesthesia (1-2% Lidocaine ) Sedation: Minimal Sedation                         Purpose: Diagnostic/Therapeutic Indications: Cervicalgia, cervical radicular pain, degenerative disc disease, severe enough to impact quality of life or function. 1. Cervical radicular pain   2. Protrusion of cervical intervertebral disc   3. Cervical stenosis of spine    NAS-11 score:   Pre-procedure: 7 /10   Post-procedure: 7 /10      Position  Prep  Materials:  Location setting: Procedure suite Position: Prone, on modified reverse trendelenburg to facilitate breathing, with head in head-cradle. Pillows positioned under chest (below chin-level) with cervical spine flexed. Safety Precautions: Patient was assessed for positional comfort and pressure points before starting the procedure. Prepping solution: DuraPrep (Iodine Povacrylex [0.7% available iodine] and Isopropyl Alcohol, 74% w/w) Prep Area: Entire  cervicothoracic region Approach:  percutaneous, paramedial Intended target: Posterior cervical epidural space Materials Procedure:  Tray: Epidural Needle(s): Epidural (Tuohy) Qty: 1 Length: (90mm) 3.5-inch Gauge: 22G   H&P (Pre-op Assessment):  Damon Blair is a 33 y.o. (year old), male patient, seen today for interventional treatment. He  has a past surgical history that includes Fracture surgery (Left); Cystoscopy/ureteroscopy/holmium laser/stent placement (Left, 11/18/2021); and Cystoscopy w/ retrogrades (11/18/2021). Damon Blair has a current medication list which includes the following prescription(s): acetaminophen , amphetamine -dextroamphetamine , atorvastatin , baclofen , cetirizine , vitamin d -3, cyanocobalamin , diazepam , dronabinol , gabapentin , glatopa , imiquimod , naproxen , fish oil, and valacyclovir . His primarily concern today is the Neck Pain  Initial Vital Signs:  Pulse/HCG Rate: 73ECG Heart Rate: 66 Temp: 97.6 F (36.4 C) Resp: 16 BP: 125/78 SpO2: 98 %  BMI: Estimated body mass index is 28.06 kg/m as calculated from the following:   Height as of this encounter: 5' 9 (1.753 m).   Weight as of this encounter: 190 lb (86.2 kg).  Risk Assessment: Allergies: Reviewed. He is allergic to clavulanic acid, dilaudid  [hydromorphone ], oxycodone -acetaminophen , and oxycodone .  Allergy Precautions: None required Coagulopathies: Reviewed. None identified.  Blood-thinner therapy: None at this time Active Infection(s): Reviewed. None identified. Damon Blair is afebrile  Site Confirmation: Damon Blair was asked to confirm the procedure and laterality before marking the site Procedure checklist: Completed Consent: Before the procedure and under the influence of no sedative(s), amnesic(s), or anxiolytics, the patient was informed of the treatment options, risks and possible complications. To fulfill our ethical and legal obligations, as recommended by the American Medical Association's Code of Ethics, I  have informed the patient of my clinical impression; the nature and purpose of the treatment or procedure; the risks, benefits, and possible complications of the intervention; the alternatives, including doing nothing; the risk(s) and benefit(s) of the alternative treatment(s) or procedure(s); and the risk(s) and benefit(s)  of doing nothing. The patient was provided information about the general risks and possible complications associated with the procedure. These may include, but are not limited to: failure to achieve desired goals, infection, bleeding, organ or nerve damage, allergic reactions, paralysis, and death. In addition, the patient was informed of those risks and complications associated to Spine-related procedures, such as failure to decrease pain; infection (i.e.: Meningitis, epidural or intraspinal abscess); bleeding (i.e.: epidural hematoma, subarachnoid hemorrhage, or any other type of intraspinal or peri-dural bleeding); organ or nerve damage (i.e.: Any type of peripheral nerve, nerve root, or spinal cord injury) with subsequent damage to sensory, motor, and/or autonomic systems, resulting in permanent pain, numbness, and/or weakness of one or several areas of the body; allergic reactions; (i.e.: anaphylactic reaction); and/or death. Furthermore, the patient was informed of those risks and complications associated with the medications. These include, but are not limited to: allergic reactions (i.e.: anaphylactic or anaphylactoid reaction(s)); adrenal axis suppression; blood sugar elevation that in diabetics may result in ketoacidosis or comma; water retention that in patients with history of congestive heart failure may result in shortness of breath, pulmonary edema, and decompensation with resultant heart failure; weight gain; swelling or edema; medication-induced neural toxicity; particulate matter embolism and blood vessel occlusion with resultant organ, and/or nervous system infarction;  and/or aseptic necrosis of one or more joints. Finally, the patient was informed that Medicine is not an exact science; therefore, there is also the possibility of unforeseen or unpredictable risks and/or possible complications that may result in a catastrophic outcome. The patient indicated having understood very clearly. We have given the patient no guarantees and we have made no promises. Enough time was given to the patient to ask questions, all of which were answered to the patient's satisfaction. Mr. Burdo has indicated that he wanted to continue with the procedure. Attestation: I, the ordering provider, attest that I have discussed with the patient the benefits, risks, side-effects, alternatives, likelihood of achieving goals, and potential problems during recovery for the procedure that I have provided informed consent. Date  Time: 05/05/2024  9:05 AM   Pre-Procedure Preparation:  Monitoring: As per clinic protocol. Respiration, ETCO2, SpO2, BP, heart rate and rhythm monitor placed and checked for adequate function Safety Precautions: Patient was assessed for positional comfort and pressure points before starting the procedure. Time-out: I initiated and conducted the Time-out before starting the procedure, as per protocol. The patient was asked to participate by confirming the accuracy of the Time Out information. Verification of the correct person, site, and procedure were performed and confirmed by me, the nursing staff, and the patient. Time-out conducted as per Joint Commission's Universal Protocol ([email or web address].01.01.01). Time: 1027 Start Time: 1027 hrs.  Description  Narrative of Procedure:          Rationale (medical necessity): procedure needed and proper for the diagnosis and/or treatment of the patient's medical symptoms and needs. Start Time: 1027 hrs. Safety Precautions: Aspiration looking for blood return was conducted prior to all injections. At no point did we inject any  substances, as a needle was being advanced. No attempts were made at seeking any paresthesias. Safe injection practices and needle disposal techniques used. Medications properly checked for expiration dates. SDV (single dose vial) medications used. Description of procedure: Protocol guidelines were followed. The patient was assisted into a comfortable position. The target area was identified and the area prepped in the usual manner. Skin & deeper tissues infiltrated with local anesthetic. Appropriate amount of time allowed to pass  for local anesthetics to take effect. Using fluoroscopic guidance, the epidural needle was introduced through the skin, ipsilateral to the reported pain, and advanced to the target area. Posterior laminar os was contacted and the needle walked caudad, until the lamina was cleared. The ligamentum flavum was engaged and the epidural space identified using "loss-of-resistance technique" with 2-3 ml of PF-NaCl (0.9% NSS), in a 5cc dedicated LOR syringe. (See Imaging guidance below for use of contrast details.) Once proper needle placement was secured, and negative aspiration confirmed, the solution was injected in intermittent fashion, asking for systemic symptoms every 0.5cc. The needles were then removed and the area cleansed, making sure to leave some of the prepping solution back to take advantage of its long term bactericidal properties.  Vitals:   05/05/24 0919 05/05/24 1026 05/05/24 1032  BP: 125/78 113/77 120/80  Pulse: 73    Resp: 16 18 18   Temp: 97.6 F (36.4 C)    SpO2: 98% 100% 100%  Weight: 190 lb (86.2 kg)    Height: 5' 9 (1.753 m)        End Time: 1031 hrs.  Imaging Guidance (Spinal):          Type of Imaging Technique: Fluoroscopy Guidance (Spinal) Indication(s): Fluoroscopy guidance for needle placement to enhance accuracy in procedures requiring precise needle localization for targeted delivery of medication in or near specific anatomical locations not  easily accessible without such real-time imaging assistance. Exposure Time: Please see nurses notes. Contrast: Before injecting any contrast, we confirmed that the patient did not have an allergy to iodine, shellfish, or radiological contrast. Once satisfactory needle placement was completed at the desired level, radiological contrast was injected. Contrast injected under live fluoroscopy. No contrast complications. See chart for type and volume of contrast used. Fluoroscopic Guidance: I was personally present during the use of fluoroscopy. Tunnel Vision Technique used to obtain the best possible view of the target area. Parallax error corrected before commencing the procedure. Direction-depth-direction technique used to introduce the needle under continuous pulsed fluoroscopy. Once target was reached, antero-posterior, oblique, and lateral fluoroscopic projection used confirm needle placement in all planes. Images permanently stored in EMR. Interpretation: I personally interpreted the imaging intraoperatively. Adequate needle placement confirmed in multiple planes. Appropriate spread of contrast into desired area was observed. No evidence of afferent or efferent intravascular uptake. No intrathecal or subarachnoid spread observed. Permanent images saved into the patient's record.  Post-operative Assessment:  Post-procedure Vital Signs:  Pulse/HCG Rate: 7365 Temp: 97.6 F (36.4 C) Resp: 18 BP: 120/80 SpO2: 100 %  EBL: None  Complications: No immediate post-treatment complications observed by team, or reported by patient.  Note: The patient tolerated the entire procedure well. A repeat set of vitals were taken after the procedure and the patient was kept under observation following institutional policy, for this type of procedure. Post-procedural neurological assessment was performed, showing return to baseline, prior to discharge. The patient was provided with post-procedure discharge  instructions, including a section on how to identify potential problems. Should any problems arise concerning this procedure, the patient was given instructions to immediately contact us , at any time, without hesitation. In any case, we plan to contact the patient by telephone for a follow-up status report regarding this interventional procedure.  Comments:  No additional relevant information.  Plan of Care (POC)  Orders:  Orders Placed This Encounter  Procedures   DG PAIN [facility] C-ARM 1-60 MIN NO REPORT    Intraoperative interpretation by procedural physician at [facility].    Standing Status:   Standing    Number of Occurrences:   1    Reason for exam::   Assistance in needle guidance and placement for procedures requiring needle placement in or near specific anatomical locations not easily accessible without such assistance.    Medications ordered for procedure: Meds ordered this encounter  Medications   iohexol  (OMNIPAQUE ) 180 MG/ML injection 10 mL    Must be Myelogram-compatible. If not available, you may substitute with a water-soluble, non-ionic, hypoallergenic, myelogram-compatible radiological contrast medium.   lidocaine  (XYLOCAINE ) 2 % (with pres) injection 400 mg   diazepam  (VALIUM ) tablet 5 mg    Make sure Flumazenil is available in the pyxis when using this medication. If oversedation occurs, administer 0.2 mg IV over 15 sec. If after 45 sec no response, administer 0.2 mg again over 1 min; may repeat at 1 min intervals; not to exceed 4 doses (1 mg)   ropivacaine  (PF) 2 mg/mL (0.2%) (NAROPIN ) injection 1 mL   sodium chloride  flush (NS) 0.9 % injection 1 mL   dexamethasone  (DECADRON ) injection 10 mg   Medications administered: We administered iohexol , lidocaine , diazepam , ropivacaine  (PF) 2 mg/mL (0.2%), sodium chloride  flush, and dexamethasone .  See the medical record for exact dosing, route, and time of administration.  Follow-up plan:   Return in about 3  weeks (around 05/26/2024) for PPE, VV.       Left C-ESI 07/16/23, 01/07/24, 05/05/24    Recent Visits Date Type Provider Dept  02/18/24 Office Visit Marcelino Nurse, MD [facility]  Showing recent visits within past 90 days and meeting all other requirements Today's Visits Date Type Provider Dept  05/05/24 Procedure visit Marcelino Nurse, MD [facility]  Showing today's visits and meeting all other requirements Future Appointments Date Type Provider Dept  05/26/24 Appointment Marcelino Nurse, MD [facility]  Showing future appointments within next 90 days and meeting all other requirements  Disposition: Discharge home  Discharge (Date  Time): 05/05/2024; 1040 hrs.   Primary Care Physician: Bair, Kalpana, MD Location: [facility] Note by: Nurse Marcelino, MD (TTS technology used. I apologize for any typographical errors that were not detected and corrected.) Date: 05/05/2024; Time: 10:37 AM  Disclaimer:  Medicine is not an Visual merchandiser. The only guarantee in medicine is that nothing is guaranteed. It is important to note that the decision to proceed with this intervention was based on the information collected from the patient. The Data and conclusions were drawn from the patient's questionnaire, the interview, and the physical examination. Because the information was provided in large part by the patient, it cannot be guaranteed that it has not been purposely or unconsciously manipulated. Every effort has been made to obtain as much relevant data as possible for this evaluation. It is important to note that the conclusions that lead to this procedure are derived in large part from the available data. Always take into account that the treatment will also be dependent on availability of resources and existing treatment guidelines, considered by other Pain Management Practitioners as being common knowledge and practice, at the time of the  intervention. For Medico-Legal purposes, it is also important to point out that variation in procedural techniques and pharmacological choices are the acceptable norm. The indications, contraindications, technique, and results of the above procedure should only be interpreted and judged by a Board-Certified Interventional Pain Specialist with extensive familiarity and expertise in the same exact procedure and technique.

## 2024-05-05 NOTE — Patient Instructions (Signed)
 ______________________________________________________________________    Post-Procedure Discharge Instructions  INSTRUCTIONS Apply ice:  Purpose: This will minimize any swelling and discomfort after procedure.  When: Day of procedure, as soon as you get home. How: Fill a plastic sandwich bag with crushed ice. Cover it with a small towel and apply to injection site. How long: (15 min on, 15 min off) Apply for 15 minutes then remove x 15 minutes.  Repeat sequence on day of procedure, until you go to bed. Apply heat:  Purpose: To treat any soreness and discomfort from the procedure. When: Starting the next day after the procedure. How: Apply heat to procedure site starting the day following the procedure. How long: May continue to repeat daily, until discomfort goes away. Food intake: Start with clear liquids (like water ) and advance to regular food, as tolerated.  Physical activities: Keep activities to a minimum for the first 8 hours after the procedure. After that, then as tolerated. Driving: If you have received any sedation, be responsible and do not drive. You are not allowed to drive for 24 hours after having sedation. Blood thinner: (Applies only to those taking blood thinners) You may restart your blood thinner 6 hours after your procedure. Insulin: (Applies only to Diabetic patients taking insulin) As soon as you can eat, you may resume your normal dosing schedule. Infection prevention: Keep procedure site clean and dry. Shower daily and clean area with soap and water .  PAIN DIARY Post-procedure Pain Diary: Extremely important that this be done correctly and accurately. Recorded information will be used to determine the next step in treatment. For the purpose of accuracy, follow these rules: Evaluate only the area treated. Do not report or include pain from an untreated area. For the purpose of this evaluation, ignore all other areas of pain, except for the treated area. After your  procedure, avoid taking a long nap and attempting to complete the pain diary after you wake up. Instead, set your alarm clock to go off every hour, on the hour, for the initial 8 hours after the procedure. Document the duration of the numbing medicine, and the relief you are getting from it. Do not go to sleep and attempt to complete it later. It will not be accurate. If you received sedation, it is likely that you were given a medication that may cause amnesia. Because of this, completing the diary at a later time may cause the information to be inaccurate. This information is needed to plan your care. Follow-up appointment: Keep your post-procedure follow-up evaluation appointment after the procedure (usually 2 weeks for most procedures, 6 weeks for radiofrequencies). DO NOT FORGET to bring you pain diary with you.   EXPECT... (What should I expect to see with my procedure?) From numbing medicine (AKA: Local Anesthetics): Numbness or decrease in pain. You may also experience some weakness, which if present, could last for the duration of the local anesthetic. Onset: Full effect within 15 minutes of injected. Duration: It will depend on the type of local anesthetic used. On the average, 1 to 8 hours.  From steroids (Applies only if steroids were used): Decrease in swelling or inflammation. Once inflammation is improved, relief of the pain will follow. Onset of benefits: Depends on the amount of swelling present. The more swelling, the longer it will take for the benefits to be seen. In some cases, up to 10 days. Duration: Steroids will stay in the system x 2 weeks. Duration of benefits will depend on multiple posibilities including persistent irritating  factors. Side-effects: If present, they may typically last 2 weeks (the duration of the steroids). Frequent: Cramps (if they occur, drink Gatorade and take over-the-counter Magnesium  450-500 mg once to twice a day); water  retention with temporary weight  gain; increases in blood sugar; decreased immune system response; increased appetite. Occasional: Facial flushing (red, warm cheeks); mood swings; menstrual changes. Uncommon: Long-term decrease or suppression of natural hormones; bone thinning. (These are more common with higher doses or more frequent use. This is why we prefer that our patients avoid having any injection therapies in other practices.)  Very Rare: Severe mood changes; psychosis; aseptic necrosis. From procedure: Some discomfort is to be expected once the numbing medicine wears off. This should be minimal if ice and heat are applied as instructed.  CALL IF... (When should I call?) You experience numbness and weakness that gets worse with time, as opposed to wearing off. New onset bowel or bladder incontinence. (Applies only to procedures done in the spine)  Emergency Numbers: Durning business hours (Monday - Thursday, 8:00 AM - 4:00 PM) (Friday, 9:00 AM - 12:00 Noon): [phone] After hours: [phone] NOTE: If you are having a problem and are unable connect with, or to talk to a provider, then go to your nearest urgent care or emergency department. If the problem is serious and urgent, please call 911. ______________________________________________________________________    Epidural Steroid Injection Patient Information  Description: The epidural space surrounds the nerves as they exit the spinal cord.  In some patients, the nerves can be compressed and inflamed by a bulging disc or a tight spinal canal (spinal stenosis).  By injecting steroids into the epidural space, we can bring irritated nerves into direct contact with a potentially helpful medication.  These steroids act directly on the irritated nerves and can reduce swelling and inflammation which often leads to decreased pain.  Epidural steroids may be injected anywhere along the spine and from the neck to the low back depending upon the location of your  pain.   After numbing the skin with local anesthetic (like Novocaine), a small needle is passed into the epidural space slowly.  You may experience a sensation of pressure while this is being done.  The entire block usually last less than 10 minutes.  Conditions which may be treated by epidural steroids:  Low back and leg pain Neck and arm pain Spinal stenosis Post-laminectomy syndrome Herpes zoster (shingles) pain Pain from compression fractures  Preparation for the injection:  Do not eat any solid food or dairy products within 8 hours of your appointment.  You may drink clear liquids up to 3 hours before appointment.  Clear liquids include water , black coffee, juice or soda.  No milk or cream please. You may take your regular medication, including pain medications, with a sip of water  before your appointment  Diabetics should hold regular insulin (if taken separately) and take 1/2 normal NPH dos the morning of the procedure.  Carry some sugar containing items with you to your appointment. A driver must accompany you and be prepared to drive you home after your procedure.  Bring all your current medications with your. An IV may be inserted and sedation may be given at the discretion of the physician.   A blood pressure cuff, EKG and other monitors will often be applied during the procedure.  Some patients may need to have extra oxygen administered for a short period. You will be asked to provide medical information, including your allergies, prior to  the procedure.  We must know immediately if you are taking blood thinners (like Coumadin/Warfarin)  Or if you are allergic to IV iodine contrast (dye). We must know if you could possible be pregnant.  Possible side-effects: Bleeding from needle site Infection (rare, may require surgery) Nerve injury (rare) Numbness & tingling (temporary) Difficulty urinating (rare, temporary) Spinal headache ( a headache worse with upright posture) Light  -headedness (temporary) Pain at injection site (several days) Decreased blood pressure (temporary) Weakness in arm/leg (temporary) Pressure sensation in back/neck (temporary)  Call if you experience: Fever/chills associated with headache or increased back/neck pain. Headache worsened by an upright position. New onset weakness or numbness of an extremity below the injection site Hives or difficulty breathing (go to the emergency room) Inflammation or drainage at the infection site Severe back/neck pain Any new symptoms which are concerning to you  Please note:  Although the local anesthetic injected can often make your back or neck feel good for several hours after the injection, the pain will likely return.  It takes 3-7 days for steroids to work in the epidural space.  You may not notice any pain relief for at least that one week.  If effective, we will often do a series of three injections spaced 3-6 weeks apart to maximally decrease your pain.  After the initial series, we generally will wait several months before considering a repeat injection of the same type.  If you have any questions, please call [phone] [facility]

## 2024-05-05 NOTE — Progress Notes (Signed)
 Safety precautions to be maintained throughout the outpatient stay will include: orient to surroundings, keep bed in low position, maintain call bell within reach at all times, provide assistance with transfer out of bed and ambulation.

## 2024-05-06 ENCOUNTER — Telehealth: Payer: Self-pay | Admitting: *Deleted

## 2024-05-06 NOTE — Telephone Encounter (Signed)
 No problems post procedure.

## 2024-05-11 ENCOUNTER — Other Ambulatory Visit: Payer: Self-pay

## 2024-05-23 ENCOUNTER — Telehealth: Payer: Self-pay | Admitting: *Deleted

## 2024-05-23 NOTE — Telephone Encounter (Signed)
 Attempted to call for pre telephone appointment nursing assessment. No answer, unable to leave a message.

## 2024-05-26 ENCOUNTER — Ambulatory Visit
Attending: Student in an Organized Health Care Education/Training Program | Admitting: Student in an Organized Health Care Education/Training Program

## 2024-05-26 ENCOUNTER — Encounter: Payer: Self-pay | Admitting: Student in an Organized Health Care Education/Training Program

## 2024-05-26 DIAGNOSIS — M5412 Radiculopathy, cervical region: Secondary | ICD-10-CM | POA: Diagnosis not present

## 2024-05-26 DIAGNOSIS — M4802 Spinal stenosis, cervical region: Secondary | ICD-10-CM

## 2024-05-26 DIAGNOSIS — M5020 Other cervical disc displacement, unspecified cervical region: Secondary | ICD-10-CM

## 2024-05-26 NOTE — Progress Notes (Signed)
 PROVIDER NOTE: Interpretation of information contained herein should be left to medically-trained personnel. Specific patient instructions are provided elsewhere under Patient Instructions section of medical record. This document was created in part using AI and STT-dictation technology, any transcriptional errors that may result from this process are unintentional.  Patient: Damon Blair  Service: E/M   PCP: Abbey Bruckner, MD  DOB: [birth year]  DOS: 05/26/2024  Provider: Wallie Sherry, MD  MRN: [id number]  Delivery: Virtual Visit  Specialty: Interventional Pain Management  Type: Established Patient  Setting: Ambulatory outpatient facility  Specialty designation: 09  Referring Prov.: Bair, Bruckner, MD  Location: Remote location       Virtual Encounter - Pain Management PROVIDER NOTE: Information contained herein reflects review and annotations entered in association with encounter. Interpretation of such information and data should be left to medically-trained personnel. Information provided to patient can be located elsewhere in the medical record under Patient Instructions. Document created using STT-dictation technology, any transcriptional errors that may result from process are unintentional.    Contact & Pharmacy Preferred: [phone] Home: [phone] (home) Mobile: [phone] (mobile) E-mail: [email or web address]  [id number] [place], [place] - [address] [place] [address] Phone: [phone] Fax: [phone]  [facility] - [place], [place] - [address] [facility] [address] [place] [address] Phone: [phone] Fax: [phone]  [id number] - [place], [place] - [address] [address] Phone: [phone] Fax: [phone]   Pre-screening  Mr. Mcfann offered in-person vs virtual encounter. He indicated preferring virtual for this encounter.   Reason COVID-19*   Social distancing based on CDC and AMA recommendations.   I contacted Matei Magnone on 05/26/2024 via telephone.      I clearly identified myself as Wallie Sherry, MD. I verified that I was speaking with the correct person using two identifiers (Name: Ceferino Lang, and date of birth: 1991-02-16).  Consent I sought verbal advanced consent from Damon Blair for virtual visit interactions. I informed Mr. Bua of possible security and privacy concerns, risks, and limitations associated with providing not-in-person medical evaluation and management services. I also informed Mr. Rauf of the availability of in-person appointments. Finally, I informed him that there would be a charge for the virtual visit and that he could be  personally, fully or partially, financially responsible for it. Mr. Mcelwain expressed understanding and agreed to proceed.   Historic Elements   Mr. Dartagnan Beavers is a 33 y.o. year old, male patient evaluated today after our last contact on 05/05/2024. Mr. Hiney  has a past medical history of Acute pyelonephritis (11/15/2021), Anxiety, Arthritis, Hydronephrosis with urinary obstruction due to ureteral calculus (11/15/2021), Influenza A (09/17/2023), Kidney stones, MS (multiple sclerosis), Neuromuscular disorder (HCC), and Pyelonephritis of left kidney (11/15/2021). He also  has a past surgical history that includes Fracture surgery (Left); Cystoscopy/ureteroscopy/holmium laser/stent placement (Left, 11/18/2021); and Cystoscopy w/ retrogrades (11/18/2021). Mr. Kimmons has a current medication list which includes the following prescription(s): acetaminophen , amphetamine -dextroamphetamine , atorvastatin , baclofen , cetirizine , vitamin d -3, cyanocobalamin , diazepam , dronabinol , gabapentin , glatopa , imiquimod , naproxen , fish oil, and valacyclovir . He  reports that he has quit smoking. His smoking use included cigarettes. He has never been exposed  to tobacco smoke. He has quit using smokeless tobacco.  His smokeless tobacco use included snuff. He reports that he does not currently use alcohol. He reports that he does not currently use drugs. Mr. Baughman is allergic to clavulanic acid, dilaudid  [hydromorphone ], oxycodone -acetaminophen , and oxycodone .  BMI: Estimated  body mass index is 28.06 kg/m as calculated from the following:   Height as of 05/05/24: 5' 9 (1.753 m).   Weight as of 05/05/24: 190 lb (86.2 kg). Last encounter: 02/18/2024. Last procedure: 05/05/2024.  HPI  Today, he is being contacted for a post-procedure assessment.  Post-Procedure Evaluation   Procedure: Cervical Epidural Steroid injection (CESI) (Interlaminar) #3  Laterality: Midline  Level: C7-T1 Imaging: Fluoroscopy-assisted DOS: 05/05/2024  Performed by: Wallie Sherry, MD Anesthesia: Local anesthesia (1-2% Lidocaine ) Sedation: Minimal Sedation                         Purpose: Diagnostic/Therapeutic Indications: Cervicalgia, cervical radicular pain, degenerative disc disease, severe enough to impact quality of life or function. 1. Cervical radicular pain   2. Protrusion of cervical intervertebral disc   3. Cervical stenosis of spine    NAS-11 score:   Pre-procedure: 7 /10   Post-procedure: 7 /10     Effectiveness:  Initial hour after procedure: 50 %  Subsequent 4-6 hours post-procedure: 50 %  Analgesia past initial 6 hours: 60 %  Ongoing improvement:  Analgesic:  60% Function: Somewhat improved ROM: Somewhat improved    UDS:  Summary  Date Value Ref Range Status  07/12/2023 FINAL  Corrected    Comment:    ==================================================================== [employer] Select 13 (MW) ==================================================================== Specimen Alert A duplicate report has been generated due to demographic updates. ==================================================================== Test                              Result       Flag       Units  Drug Present and Declared for Prescription Verification   Amphetamine                     914          EXPECTED   ng/mg creat    Amphetamine  is available as a schedule II prescription drug.    Desmethyldiazepam              221          EXPECTED   ng/mg creat   Oxazepam                       796          EXPECTED   ng/mg creat   Temazepam                      424          EXPECTED   ng/mg creat    Desmethyldiazepam, oxazepam, and temazepam are expected metabolites    of diazepam . Desmethyldiazepam and oxazepam are also expected    metabolites of other drugs, including chlordiazepoxide, prazepam,    clorazepate, and halazepam. Oxazepam is an expected metabolite of    temazepam. Oxazepam and temazepam are also available as scheduled    prescription medications.    Carboxy-THC                    >1389        EXPECTED   ng/mg creat    Carboxy-THC is a metabolite of tetrahydrocannabinol (THC). Source of    THC is most commonly herbal marijuana or marijuana-based products,    but THC is also present in a scheduled prescription medication.    Trace amounts of THC can  be present in hemp and cannabidiol (CBD)    products. This test is not intended to distinguish between delta-9-    tetrahydrocannabinol, the predominant form of THC in most herbal or    marijuana-based products, and delta-8-tetrahydrocannabinol.  Drug Present not Declared for Prescription Verification   Alprazolam                     115          UNEXPECTED ng/mg creat   Alpha-hydroxyalprazolam        190          UNEXPECTED ng/mg creat    Source of alprazolam is a scheduled prescription medication. Alpha-    hydroxyalprazolam is an expected metabolite of alprazolam.  ==================================================================== Test                      Result    Flag   Units      Ref Range   Creatinine              72               mg/dL       >=79 ==================================================================== Declared Medications:  The flagging and interpretation on this report are based on the  following declared medications.  Unexpected results may arise from  inaccuracies in the declared medications.   **Note: The testing scope of this panel includes these medications:   Amphetamine  (Adderall)  Diazepam  (Valium )  Tetrahydrocannabinol (Marinol )   **Note: The testing scope of this panel does not include the  following reported medications:   Acetaminophen  (Tylenol )  Baclofen  (Lioresal )  Cetirizine  (Zyrtec )  Fish Oil  Gabapentin  (Neurontin )  Glatiramer  (Glatopa )  Indomethacin  (Indocin )  Naproxen  (Naprosyn )  Rosuvastatin  (Crestor )  Vitamin B12  Vitamin D3 ==================================================================== For clinical consultation, please call [phone]. ====================================================================    No results found for: MABLE OYSTER, [id number]  Laboratory Chemistry Profile   Renal Lab Results  Component Value Date   BUN 17 02/12/2024   CREATININE 0.83 02/12/2024   GFR 115.73 02/12/2024   GFRNONAA >60 06/01/2023    Hepatic Lab Results  Component Value Date   AST 17 02/12/2024   ALT 17 02/12/2024   ALBUMIN 4.9 02/12/2024   ALKPHOS 42 02/12/2024   LIPASE 34 01/20/2022    Electrolytes Lab Results  Component Value Date   NA 140 02/12/2024   K 4.4 02/12/2024   CL 103 02/12/2024   CALCIUM  9.5 02/12/2024   MG 1.7 05/08/2023    Bone Lab Results  Component Value Date   VD25OH 31.80 07/16/2023    Inflammation (CRP: Acute Phase) (ESR: Chronic Phase) Lab Results  Component Value Date   LATICACIDVEN 0.7 11/15/2021         Note: Above Lab results reviewed.   Assessment  The primary encounter diagnosis was Cervical radicular pain. Diagnoses of Protrusion of cervical intervertebral disc and Cervical stenosis of spine were also  pertinent to this visit.  Plan of Care  Patient doing well after his cervical epidural steroid injection states pain is significantly better along with range of motion.  States that he does not have Lhermitte sign.  Continue to monitor symptoms, repeat as needed  Follow-up plan:   Return if symptoms worsen or fail to improve.      Left C-ESI 07/16/23, 01/07/24, 05/05/24    Recent Visits Date Type Provider Dept  05/05/24 Procedure visit Marcelino Nurse, MD [facility]  Showing recent visits  within past 90 days and meeting all other requirements Today's Visits Date Type Provider Dept  05/26/24 Office Visit Marcelino Nurse, MD [facility]  Showing today's visits and meeting all other requirements Future Appointments No visits were found meeting these conditions. Showing future appointments within next 90 days and meeting all other requirements  I discussed the assessment and treatment plan with the patient. The patient was provided an opportunity to ask questions and all were answered. The patient agreed with the plan and demonstrated an understanding of the instructions.  Patient advised to call back or seek an in-person evaluation if the symptoms or condition worsens.  Duration of encounter: .  Note by: Nurse Marcelino, MD Date: 05/26/2024; Time: 3:25 PM

## 2024-05-28 ENCOUNTER — Ambulatory Visit (INDEPENDENT_AMBULATORY_CARE_PROVIDER_SITE_OTHER): Admitting: Psychology

## 2024-05-28 DIAGNOSIS — F89 Unspecified disorder of psychological development: Secondary | ICD-10-CM | POA: Diagnosis not present

## 2024-05-28 NOTE — Progress Notes (Addendum)
 Date: 05/28/2024 Appointment Start Time: 9:05am (patient arrived late to the appointment) Duration: 117 minutes Provider: Frederic Fire, PsyD Type of Session: Initial Appointment for Evaluation  Location of Patient: Home Location of Provider: Provider's Home (private office) Type of Contact: Caregility video visit with audio  Session Content:  Prior to proceeding with today's appointment, two pieces of identifying information were obtained from [place] to verify identity. In addition, [name]'s physical location at the time of this appointment was obtained. In the event of technical difficulties, [place]on [place] shared a phone number he could be reached at. [place]on [place] and this provider participated in today's telepsychological service. [place] denied anyone else being present in the room or on the virtual appointment.  The provider's role was explained to [place]. The provider reviewed and discussed issues of confidentiality, privacy, and limits therein (e.g., reporting obligations). In addition to verbal informed consent, written informed consent for psychological services was obtained from [place] prior to the initial appointment. Written consent included information concerning the practice, financial arrangements, and confidentiality and patients' rights. Since the clinic is not a 24/7 crisis center, mental health emergency resources were shared, and the provider explained e-mail, voicemail, and/or other messaging systems should be utilized only for non-emergency reasons. This provider also explained that information obtained during appointments will be placed in their electronic medical record in a confidential manner. [place]on [place] verbally acknowledged understanding of the aforementioned and agreed to use mental health emergency resources discussed if needed. Moreover, [place]on [place] agreed information may be shared with other [facility] or their referring provider(s) as needed for coordination of care. By signing the new patient documents, [place]on [place]  provided written consent for coordination of care. [place] verbally acknowledged understanding he is ultimately responsible for understanding his insurance benefits as it relates to reimbursement of telepsychological and in-person services. This provider also reviewed confidentiality, as it relates to telepsychological services, as well as the rationale for telepsychological services. This provider further explained that video should not be captured, photos should not be taken, nor should testing stimuli be copied or recorded as it would be a copyright violation. [place] expressed understanding of the aforementioned, and verbally consented to proceed. [place] is aware of the limitations of teleheath visits and verbally consented to proceed.  [place] completed the neurobehavioral examination, which included obtaining a, family, social, and psychiatric history; integration of prior history and other sources of clinical data to assist with clinical decision making; behavioral observations; assessment of thinking, reasoning, and judgment; and establishment of a provisional diagnosis. The evaluation was completed in 117 minutes. Codes 03883 and [id number] were billed.   Mental Status Examination:  Appearance:  neat Behavior: appropriate to circumstances Mood: neutral Affect: mood congruent Speech: tangential  Eye Contact: appropriate Psychomotor Activity: WNL Thought Process: denies suicidal, homicidal, and self-harm ideation, plan and intent Content/Perceptual Disturbances: none Orientation: AAOx4 Cognition/Sensorium: intact Insight: good Judgment: good  Provisional DSM-5 diagnosis(es):  F89 Unspecified Disorder of Psychological Development   Plan: Testing is expected to answer the question, does the individual meet criteria for ADHD when age, other mental health concerns (e.g., anxiety, depression, multiple sclerosis), and cognitive functioning are taken into consideration? Further testing is warranted because a  diagnosis cannot be given solely based on current interview data (further data is required). Testing results are expected to answer the remaining diagnostic questions in order to provide an accurate diagnosis and assist in treatment planning with an expectation of improved clinical outcome. [place]on [place] is currently scheduled for an appointment on 06/05/2024 at 10am via Caregility video visit  with audio.      CONFIDENTIAL PSYCHOLOGICAL EVALUATION ______________________________________________________________________________ Name: [name] Date of Birth: November 03, 1990    Age: 33   BACKGROUND INFORMATION AND PRESENTING PROBLEM: [name] is a 33 year old male who resides in [place] .  [name] reported he was diagnosed with ADHD, likely by his pediatrician, in the fourth or fifth grade and that he has "been on Adderall" since this time. However, he added how there were periods without use of ADHD-related medication as he was "without [health] insurance." He further reported how his medical care team has suggested he receive a "more up-to-date" ADHD evaluation before potentially prescribing medication for ADHD. He described Adderall as overall helpful, although shared how it also "made [him] so reserved and calm" that he "found it harder to socialize." He endorsed the following ADHD-related symptoms:  Symptoms Frequency   Often Occasionally Infrequent/No Significant Issues  Inattention Criterion (DSM-5-TR)     Making careless mistakes and missing small details.  He attributed this symptom, which commonly occurs secondary to efforts to finish tasks quickly and to the belief that the work is "good enough," noting that it negatively impacted his academic functioning.     Being easily distracted by various stimuli and the mind often being elsewhere, even when no apparent distractions exist.  He reported regularly being distracted by various stimuli (e.g., irrelevant tasks and  thoughts).    Trouble sustaining attention during conversations.  He also reported often having trouble sustaining his attention during conversations, attributing it to "racing thoughts" and "thinking about how to respond."    Does not follow through on instructions and fails to finish tasks.  He commonly had trouble with task initiation and maintenance, which he attributed to forgetfulness and inattention.     Difficulty organizing tasks and activities.  He often discussed being prone to clutter around him, experiencing task maintenance issues, and having trouble meeting deadlines, adding that forgetfulness and inattention seem to contribute to it.     Avoids, dislikes, or is reluctant to engage in tasks that require sustained mental effort. He attributed regularly putting off tasks despite "not liking to feel rushed," noting he is especially likely to put off tasks that are "something new" or "unknown."    Loses things necessary for tasks or activities.  He reported commonly misplacing items and forgetting needed items for tasks.    Forgetful in daily activities.  He also reported being prone to forgetting plans and appointments.     Hyperactivity and Impulsivity Criterion (DSM-5-TR)     Fidgets with hands or feet or squirms in seat. He shared that he habitually, and largely unconsciously, fidgets (e.g., bites his nails, "pick[s] skin" around his "finger tips," "moving" his fingers, and "nodding [his] head."    Leaves seat in situations in which remaining seated is expected.   He denied experiencing significant issues with this symptom.   Feelings of restlessness. He stated he regularly feels restless, which contributes to difficulty relaxing. However, he shared that this symptom essentially became noticeable after he ceased use of Dilaudid .    Difficulty playing or engaging in leisure activities quietly. He noted that he is prone to making comments during leisure activities (e.g., "mhmm noises that  just come out").    "On the go" or often acts as if "driven by a motor." He reported frequently feeling that he must be moving or doing something, adding that others comment on how "quick" he talks or is "moving."    Talks excessively.  He reported that he tends to provide a lot of detail when talking, especially on topics he knows well.     Interrupts others.  He described this symptom as often occurring.     Impatience.  He also described himself as impatient.    ADHD-Related Symptoms that Assist in Identifying ADHD but are not in the DSM-5-TR Jeanna, 2021, p. 6-12 and [phone])     Emotional dysregulation and overstimulation. He discussed being prone to anxiety and anger, noting examples of how perceiving someone as a task being done incorrectly can irritate, as well as how he tends to become overwhelmed if an obstacle to a routine or plan occurs.     Making decisions impulsively. He shared a tendency to say things he later regrets, and that he tries to "promptly admit it" but "pride gets in the way sometimes." He also shared a tendency to purchase things he later regretted, but since then, he has "cancelled [employer] and keeps the negative repercussions in mind, which has been helpful.     Tending to drive much faster than others. He reported receiving speeding tickets in the past, which led him to regularly set his cruise control "at 9 miles per hour over the speed limit."     Trouble following through on promises or commitments made to others.  He noted that forgetfulness and lack of drive contribute to this difficulty's regular occurrence.     Trouble doing things in proper order or sequence.   He denied experiencing significant issues with this symptom, adding that he prioritizes the use of routines.    He expressed a belief that his ADHD-related symptoms are consistent across situations, independent of mood, and are "ingrained into [his] identity," adding that ADHD-related symptoms have  been occurring since childhood and that multiple sclerosis has exacerbated them. He stated his coping and compensatory strategies include using background music to help sustain his attention on a task, using a calendar and cruise control, mindfulness-based exercises, cancelling [employer] to reduce impulsive spending, and reminding himself of the various negative repercussions that specific actions can cause.  Mr. Girton denied awareness of having ever experienced any developmental milestone delays, grade retention, learning disability diagnosis, or having an individualized education plan. However, he noted he "had a tutor for a little bit in middle school or so" for difficulties in English and Mathematics. He further stated how he struggled to "comprehend a story or the [written] information" and that he "couldn't remember" or "capture the story into an imaginary concept." He discussed that school staff recommended he pursue an ADHD evaluation as he "wasn't sitting still," his "homework was sloppy," and he was prone to not completing assignments. He also discussed that he "started skipping school," using cannabis and drinking alcohol in high school, which led to him being required to attend a substance use treatment center." He expressed a belief that multiple sclerosis symptoms (e.g., vision problems and inattention) worsened his academic difficulties. 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 He described that "if there is a name [for a disciplinary action]," he has "probably" gotten in trouble for it in a past job. However, he shared that he is doing well at his current employment, attributing this to natural strengths that support his work, his attention to the negative repercussions of various actions, and the importance he places on his current employment functioning. He endorsed past marital strains secondary to  communication issues and his substance use, although shared how his marriage is currently "going great" as he is focused on maintaining substance cessation, and that he and his wife are focused on their "spiritual health."             Damon ONEIDA Fire, PsyD

## 2024-06-04 ENCOUNTER — Ambulatory Visit: Payer: Self-pay | Admitting: *Deleted

## 2024-06-04 ENCOUNTER — Ambulatory Visit: Admitting: Family Medicine

## 2024-06-04 VITALS — BP 130/75 | HR 73 | Temp 98.9°F | Ht 69.0 in | Wt 191.5 lb

## 2024-06-04 DIAGNOSIS — R0981 Nasal congestion: Secondary | ICD-10-CM | POA: Diagnosis not present

## 2024-06-04 DIAGNOSIS — J0140 Acute pansinusitis, unspecified: Secondary | ICD-10-CM | POA: Diagnosis not present

## 2024-06-04 DIAGNOSIS — G35D Multiple sclerosis, unspecified: Secondary | ICD-10-CM | POA: Diagnosis not present

## 2024-06-04 MED ORDER — AMOXICILLIN 875 MG PO TABS: 875.0000 mg | ORAL_TABLET | Freq: Two times a day (BID) | ORAL | 0 refills | Status: AC

## 2024-06-04 MED ORDER — FLUTICASONE PROPIONATE 50 MCG/ACT NA SUSP: 2.0000 | Freq: Every day | NASAL | 0 refills | Status: DC

## 2024-06-04 NOTE — Progress Notes (Signed)
 Acute Office Visit  Introduced to nurse practitioner role and practice setting.  All questions answered.  Discussed provider/patient relationship and expectations.   Subjective:     Patient ID: [place], male    DOB: 11/05/1990, 33 y.o.   MRN: [id number]  Chief Complaint  Patient presents with   Acute Visit    sinus symptoms, RNTriage today. He reported feverish, severe abdominal pain, congestion, cough, thick yellow mucus. GI discomfort X Thursday night. He reports taking naproxen  and sudafed but only taking a naproxen  this morning.      Discussed the use of AI scribe software for clinical note transcription with the patient, who gave verbal consent to proceed.  History of Present Illness      Damon [place] is a 33 year old male with ADHD and multiple sclerosis who presents with flu-like symptoms and gastrointestinal discomfort.  He has been experiencing flu-like symptoms over the weekend, including body aches, severe frontal/facial headaches, and a sensation of fever without an actual fever. The headaches are particularly severe in the evening, with a feeling of pressure in the forehead. Over-the-counter medications like Sudafed have been used to relieve the pressure. Feels he is developing a sinus infection  He reports gastrointestinal distress, specifically in the upper abdomen beneath the rib cage, characterized by spasms and discomfort. The spasms are described as 'fluttering' and irritating rather than painful. Aleve  have been used to manage these symptoms.  He has a history of multiple sclerosis and is under the care of a neurologist. His current medications include gabapentin , baclofen , Adderall, and Copaxone. He experiences Lhermitte's sign, causing electrical shock sensations down his spine when flexing his neck. He also has a history of herpes with recent discomfort around the anus.  He mentions a history of gastrointestinal issues related to MS, including  neurological scarring affecting bladder control and bowel movements. Typically, he has a bowel movement once a day, which varies in consistency based on his diet. He has experienced some blood when wiping, which he attributes to a possible small tear or fissure.  No fever, vomiting, diarrhea, constipation, or chest pain. Normal bowel movements and the ability to eat and drink without issue.    ROS      Objective:    BP 130/75 (BP Location: Left Arm, Patient Position: Sitting, Cuff Size: Normal)   Pulse 73   Temp 98.9 F (37.2 C) (Oral)   Ht 5' 9 (1.753 m)   Wt 191 lb 8 oz (86.9 kg)   SpO2 100%   BMI 28.28 kg/m    Physical Exam Constitutional:      General: He is not in acute distress.    Appearance: Normal appearance. He is normal weight. He is not ill-appearing, toxic-appearing or diaphoretic.  HENT:     Right Ear: Tympanic membrane, ear canal and external ear normal. Tympanic membrane is not injected, scarred, perforated, erythematous or bulging.     Left Ear: Tympanic membrane, ear canal and external ear normal. Tympanic membrane is not injected, scarred, perforated, erythematous or bulging.     Nose: Congestion and rhinorrhea present.     Right Turbinates: Swollen.     Left Turbinates: Swollen.     Right Sinus: Maxillary sinus tenderness and frontal sinus tenderness present.     Left Sinus: Maxillary sinus tenderness and frontal sinus tenderness present.     Mouth/Throat:     Mouth: Mucous membranes are dry.     Pharynx: Posterior oropharyngeal erythema present.  Eyes:  General:        Right eye: No discharge.        Left eye: No discharge.     Extraocular Movements: Extraocular movements intact.     Conjunctiva/sclera: Conjunctivae normal.     Pupils: Pupils are equal, round, and reactive to light.  Cardiovascular:     Rate and Rhythm: Normal rate and regular rhythm.     Pulses: Normal pulses.     Heart sounds: Normal heart sounds.  Pulmonary:     Effort:  Pulmonary effort is normal. No respiratory distress.     Breath sounds: Normal breath sounds. No stridor. No wheezing, rhonchi or rales.  Chest:     Chest wall: No tenderness.  Abdominal:     General: Abdomen is flat. Bowel sounds are normal. There is no distension.     Palpations: Abdomen is soft. There is no mass.     Tenderness: There is no abdominal tenderness. There is no right CVA tenderness, left CVA tenderness, guarding or rebound.     Hernia: No hernia is present.  Musculoskeletal:        General: Normal range of motion.  Lymphadenopathy:     Cervical: No cervical adenopathy.  Skin:    General: Skin is warm and dry.     Capillary Refill: Capillary refill takes less than 2 seconds.  Neurological:     General: No focal deficit present.     Mental Status: He is alert and oriented to person, place, and time. Mental status is at baseline.  Psychiatric:        Mood and Affect: Mood is anxious.        Behavior: Behavior normal.        Thought Content: Thought content normal.        Judgment: Judgment normal.     No results found for any visits on 06/04/24.      Assessment & Plan:  Assessment and Plan Assessment & Plan   Acute pansinusitis with nasal congestion Symptoms include facial pain, nasal congestion, and pressure in the forehead, consistent with sinus infection. No fever, but body aches and chills present. Differential includes sinus infection and possible GI upset due to post-nasal drip. No vomiting or diarrhea. Normal bowel movements and ability to eat and drink are reassuring. - Prescribed amoxicillin  for 7 days, twice daily. - Prescribed fluticasone nasal spray, use twice daily. - Advised hot tea with honey, salt water gargles, and hot showers. - Recommended over-the-counter Mucinex  (guaifenesin ) for expectorant effect. - Advised abdominal wall massages or heating pad for GI discomfort. - Instructed to clean nasal spray once a week with alcohol.  Multiple  sclerosis - pt having intermittent RUQ spasms, no pain or spams during visit. Heart sounds and breath sounds clear - active bowel sounds, soft abdomen - stools normal, bowel movements daily - able to eat and drink appropriately, denies NV - difficult to discern if related to sinusitis, or to patients MS - historically spasms managed by Neurology with baclofen  and diazepam .  - pt states has had hx of Lhermitte sign and MS hug due to hs MS.  - New localized spasm under right ribcage reported, which is different from previous MS hug symptoms. - Advised to contact neurologist if symptoms persist. - Encouraged hydration and warm compresses for muscle spasms - Advised if symptoms worsen, unable to eat and drink, bowel movements change please present to ED.   Problem List Items Addressed This Visit   None Visit Diagnoses  Congestion of nasal sinus    -  Primary   Relevant Medications   fluticasone (FLONASE) 50 MCG/ACT nasal spray     Acute non-recurrent pansinusitis       Relevant Medications   fluticasone (FLONASE) 50 MCG/ACT nasal spray   amoxicillin  (AMOXIL ) 875 MG tablet       Meds ordered this encounter  Medications   fluticasone (FLONASE) 50 MCG/ACT nasal spray    Sig: Place 2 sprays into both nostrils daily.    Dispense:  16 g    Refill:  0   amoxicillin  (AMOXIL ) 875 MG tablet    Sig: Take 1 tablet (875 mg total) by mouth 2 (two) times daily for 7 days.    Dispense:  14 tablet    Refill:  0    No follow-ups on file.  Curtis DELENA Boom, FNP  I, Curtis DELENA Boom, FNP, have reviewed all documentation for this visit. The documentation on 06/04/24 for the exam, diagnosis, procedures, and orders are all accurate and complete.

## 2024-06-04 NOTE — Telephone Encounter (Signed)
 FYI Only or Action Required?: FYI only for provider: appointment scheduled on 11/19.  Patient was last seen in primary care on 04/10/2024 by Abbey Bruckner, MD.  Called Nurse Triage reporting Sinusitis.  Symptoms began several days ago.  Interventions attempted: OTC medications: Dayquil, Sudafed.  Symptoms are: gradually worsening.  Triage Disposition: See HCP Within 4 Hours (Or PCP Triage)  Patient/caregiver understands and will follow disposition?: yes  Copied from CRM #[id number]. Topic: Clinical - Red Word Triage >> Jun 04, 2024  8:05 AM Eva FALCON wrote: Red Word that prompted transfer to Nurse Triage: Feverish, severe abdominal pain, congestion, cough, thick yellow mucus. GI discomfort. Reason for Disposition  [1] SEVERE sinus pain (e.g., excruciating) AND [2] not improved 2 hours after pain medicine  Answer Assessment - Initial Assessment Questions 1. LOCATION: Where does it hurt?      Head ache, sinus pressure 2. ONSET: When did the sinus pain start?  (e.g., hours, days)      Thursday 3. SEVERITY: How bad is the pain?   (Scale 0-10; or none, mild, moderate or severe)     More pressure  5. NASAL CONGESTION: Is the nose blocked? If Yes, ask: Can you open it or must you breathe through your mouth?     yes 6. NASAL DISCHARGE: Do you have discharge from your nose? If so ask, What color?     Yellow mucus 7. FEVER: Do you have a fever? If Yes, ask: What is it, how was it measured, and when did it start?      No fever- but feels hot, body aches 8. OTHER SYMPTOMS: Do you have any other symptoms? (e.g., sore throat, cough, earache, difficulty breathing)     GI discomfort- RUQ spasms, body aches  Protocols used: Sinus Pain or Congestion-A-AH

## 2024-06-05 ENCOUNTER — Ambulatory Visit: Admitting: Psychology

## 2024-06-05 DIAGNOSIS — F89 Unspecified disorder of psychological development: Secondary | ICD-10-CM

## 2024-06-05 NOTE — Progress Notes (Addendum)
 Date: 06/05/2024 Appointment Start Time: 10:04am (patient arrived late to the appointment) Duration: 68 minutes Provider: Frederic Fire, PsyD Type of Session: Psychodiagnostic interview for evaluation  Location of Patient: Home Location of Provider: Provider's Home (private office) Type of Contact: Caregility video visit with audio  Session Content:  Prior to proceeding with today's appointment, two pieces of identifying information were obtained from [place] to verify identity. In addition, Siaosi's physical location at the time of this appointment was obtained. In the event of technical difficulties, Keenen shared a phone number he could be reached at. Danyael and this provider participated in today's telepsychological service. Thorin denied anyone else being present in the room or on the virtual appointment.  Mr. Dartt shared that he and his wife are physically ill. As such, this evaluator recommended rescheduling at least the WAIS-5 administration portion of the appointment plan. Mr. Laster was agreeable with this plan, although stated a desire to complete the psychodiagnostic interview. Khasir completed the psychiatric diagnostic evaluation (history, including past, family, social, academic, and psychiatric history; behavioral observations; and establishment of a provisional diagnosis). The evaluation was completed in 68 minutes. Code 09208 was billed. See the evaluation below for additional information.   Mental Status Examination:  Appearance:  neat Behavior: appropriate to circumstances Mood: neutral Affect: mood congruent Speech: tangential  Eye Contact: appropriate Psychomotor Activity: restless Thought Process: denies suicidal, homicidal, and self-harm ideation, plan and intent Content/Perceptual Disturbances: none Orientation: AAOx4 Cognition/Sensorium: intact Insight: good Judgment: good  Provisional DSM-5 diagnosis(es):  F89 Unspecified Disorder of Psychological Development    Plan: Mr. Warmuth was scheduled for a test administration appointment with this writer on 06/13/2024 at 1pm.      CONFIDENTIAL PSYCHOLOGICAL EVALUATION ______________________________________________________________________________ Name: Mr. Alani Lacivita Date of Birth: 05-23-1991    Age: 33   BACKGROUND INFORMATION AND PRESENTING PROBLEM: Mr. Bogdan Vivona is a 33 year old male who resides in [place] .  Mr. Finigan reported he was diagnosed with ADHD, likely by his pediatrician, in the fourth or fifth grade and that he has "been on Adderall" since this time. However, he added how there were periods without use of ADHD-related medication as he was "without [health] insurance." He further reported how his medical care team has suggested he receive a "more up-to-date" ADHD evaluation before potentially prescribing medication for ADHD. He described Adderall as overall helpful, although shared how it also "made [him] so reserved and calm" that he "found it harder to socialize." He endorsed the following ADHD-related symptoms:  Symptoms Frequency   Often Occasionally Infrequent/No Significant Issues  Inattention Criterion (DSM-5-TR)     Making careless mistakes and missing small details.  He attributed this symptom, which commonly occurs secondary to efforts to finish tasks quickly and to the belief that the work is "good enough," noting that it negatively impacted his academic functioning.     Being easily distracted by various stimuli and the mind often being elsewhere, even when no apparent distractions exist.  He reported regularly being distracted by various stimuli (e.g., irrelevant tasks and thoughts).    Trouble sustaining attention during conversations.  He also reported often having trouble sustaining his attention during conversations, attributing it to "racing thoughts" and "thinking about how to respond."    Does not follow through on instructions and fails to  finish tasks.  He commonly had trouble with task initiation and maintenance, which he attributed to forgetfulness and inattention.     Difficulty organizing tasks and activities.  He often discussed being prone to clutter  around him, experiencing task maintenance issues, and having trouble meeting deadlines, adding that forgetfulness and inattention seem to contribute to it.     Avoids, dislikes, or is reluctant to engage in tasks that require sustained mental effort. He attributed regularly putting off tasks despite "not liking to feel rushed," noting he is especially likely to put off tasks that are "something new" or "unknown."    Loses things necessary for tasks or activities.  He reported commonly misplacing items and forgetting needed items for tasks.    Forgetful in daily activities.  He also reported being prone to forgetting plans and appointments.     Hyperactivity and Impulsivity Criterion (DSM-5-TR)     Fidgets with hands or feet or squirms in seat. He shared that he habitually, and largely unconsciously, fidgets (e.g., bites his nails, "pick[s] skin" around his "finger tips," "moving" his fingers, and "nodding [his] head."    Leaves seat in situations in which remaining seated is expected.   He denied experiencing significant issues with this symptom.   Feelings of restlessness. He stated he regularly feels restless, which contributes to difficulty relaxing. However, he shared that this symptom essentially became noticeable after he ceased use of Dilaudid .    Difficulty playing or engaging in leisure activities quietly. He noted that he is prone to making comments during leisure activities (e.g., "mhmm noises that just come out").    "On the go" or often acts as if "driven by a motor." He reported frequently feeling that he must be moving or doing something, adding that others comment on how "quick" he talks or is "moving."    Talks excessively.  He reported that he tends to provide a lot of  detail when talking, especially on topics he knows well.     Interrupts others.  He described this symptom as often occurring.     Impatience.  He also described himself as impatient.    ADHD-Related Symptoms that Assist in Identifying ADHD but are not in the DSM-5-TR Jeanna, 2021, p. 6-12 and [phone])     Emotional dysregulation and overstimulation. He discussed being prone to anxiety and anger, noting examples of how perceiving someone as a task being done incorrectly can irritate, as well as how he tends to become overwhelmed if an obstacle to a routine or plan occurs.     Making decisions impulsively. He shared a tendency to say things he later regrets, and that he tries to "promptly admit it" but "pride gets in the way sometimes." He also shared a tendency to purchase things he later regretted, but since then, he has "cancelled [employer] and keeps the negative repercussions in mind, which has been helpful.     Tending to drive much faster than others. He reported receiving speeding tickets in the past, which led him to regularly set his cruise control "at 9 miles per hour over the speed limit."     Trouble following through on promises or commitments made to others.  He noted that forgetfulness and lack of drive contribute to this difficulty's regular occurrence.     Trouble doing things in proper order or sequence.   He denied experiencing significant issues with this symptom, adding that he prioritizes the use of routines.    Mr. Mclaren discussed a history of depressive episodes that include low mood, reduced drive, hypersonia or hypsomnia, and psychomotor agitation, with the last instance occurring in October of 2024; generalized anxiety that shared has decreased in severity though his use  of mental health services, spiritual practices, an accepting attitude, and substance use cessation; past sleep onset issues that have improved through "getting back into a really good sleep pattern,"  limiting his use of caffeine before bed, and avoiding stimulating activities and screen time before his planned bed time; emotional eating-related behaviors (e.g., eating when not physically hungry but in an effort to improve his mood); trauma and stressor-related disorder symptomatology (e.g., past intrusive thoughts and hypervigilance) that occurred secondary to unspecified events that occurred from ages 45 to 46, adding his "efforts lately have resolved some" of the "psychological trauma;" He expressed a belief that his ADHD-related symptoms are consistent across situations, independent of mood, and are "ingrained into [his] identity," adding that ADHD-related symptoms have been occurring since childhood and that multiple sclerosis has exacerbated them. He stated his coping and compensatory strategies include using background music to help sustain his attention on a task, using a calendar and cruise control, mindfulness-based exercises, cancelling [employer] to reduce impulsive spending, and reminding himself of the various negative repercussions that specific actions can cause.  Mr. Kohles denied awareness of having ever experienced any developmental milestone delays, grade retention, learning disability diagnosis, or having an individualized education plan. However, he noted he "had a tutor for a little bit in middle school or so" for difficulties in English and Mathematics. He further stated how he struggled to "comprehend a story or the [written] information" and that he "couldn't remember" or "capture the story into an imaginary concept." He discussed that school staff recommended he pursue an ADHD evaluation as he "wasn't sitting still," his "homework was sloppy," and he was prone to not completing assignments. He also discussed that he "started skipping school," using cannabis and drinking alcohol in high school, which led to him being required to attend a substance use treatment center." He  expressed a belief that multiple sclerosis symptoms (e.g., vision problems and inattention) worsened his academic difficulties. 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 He described that "if there is a name [for a disciplinary action]," he has "probably" gotten in trouble for it in a past job. However, he shared that he is doing well at his current employment, attributing this to natural strengths that support his work, his attention to the negative repercussions of various actions, and the importance he places on his current employment functioning. He endorsed past marital strains secondary to communication issues and his substance use, although shared how his marriage is currently "going great" as he is focused on maintaining substance cessation, and that he and his wife are focused on their "spiritual health."  Mr. Benham reported his medical history is significant for multiple sclerosis that became noticeable and diagnosed in July of 2020, and that he utilizes medication and dietary changes to assist in managing; hepatitis C that has been medically managed and "clinically cured;" and a kidney stone. He also reported a history of head injury, with the first instance occurring at the age of 89 when he "came crashing down" on his head and neck, which left him in a "temporary paralysis," as well as at the age of 45 he "fell off a hand rail and landed right on [his] forehead," which resulted in loss of consciousness, feeling "very foggy," and having an "extreme headache for at least seven days or so. He denied awareness of having any lingering effects from the head injuries. 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 He reported his legal history is significant for "spending a couple of nights in jail a couple of different times." He stated his familial mental health history is significant for "exhibit[ing] behaviors of heavy drinking" (mother), problematic substance use (maternal uncle and brother), and possible ADHD as he had the "same inability to pay attention" and periods of "hyperfocus" (father).   Chart Review: Various chart notes since 2018 indicated Mr. Haddon has a history of ADHD symptoms, diagnosis, and prior use of Adderall to assist in managing the endorsed ADHD-related symptoms.  Per a 12/03/2023 appointment note, Dr. Jenkins Nicolas noted that Mr. 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

## 2024-06-06 DIAGNOSIS — F902 Attention-deficit hyperactivity disorder, combined type: Secondary | ICD-10-CM

## 2024-06-06 MED ORDER — AMPHETAMINE-DEXTROAMPHETAMINE 10 MG PO TABS: 10.0000 mg | ORAL_TABLET | Freq: Every day | ORAL | 0 refills | Status: DC

## 2024-06-13 ENCOUNTER — Ambulatory Visit: Admitting: Psychology

## 2024-06-13 DIAGNOSIS — F89 Unspecified disorder of psychological development: Secondary | ICD-10-CM

## 2024-06-13 NOTE — Progress Notes (Signed)
 Date: 06/13/2024   Appointment Start Time: 1pm Duration: 47 minutes (pt experienced brief connection drop at 1:25pm) Provider: Frederic Fire, PsyD Type of Session: Testing Appointment for Evaluation  Location of Patient: Home Location of Provider: Provider's Home (private office) Type of Contact: Caregility video visit with audio  Session Content: Today's appointment was a telepsychological visit. Damon Blair is aware it is his responsibility to secure confidentiality on his end of the session. Prior to proceeding with today's appointment, Damon Blair's physical location at the time of this appointment was obtained as well a phone number he could be reached at in the event of technical difficulties. Damon Blair denied anyone else being present in the room or on the virtual appointment. This provider reviewed that video should not be captured, photos should not be taken, nor should testing stimuli be copied or recorded as it would be a copyright violation. Damon Blair expressed understanding of the aforementioned, and verbally consented to proceed. The WAIS-5 was administered, scored, and interpreted by this evaluator. Damon Blair is aware of the limitations of teleheath visits and verbally consented to proceed.  Billing codes will be input on the feedback appointment. There are no billing codes for the testing appointment.   Provisional DSM-5 diagnosis(es):  F89 Unspecified Disorder of Psychological Development   Plan: Damon Blair was scheduled for a feedback appointment on 06/18/2024 at 11am via Caregility video visit with audio.                Frederic ONEIDA Fire, PsyD

## 2024-06-18 ENCOUNTER — Ambulatory Visit: Admitting: Psychology

## 2024-06-18 DIAGNOSIS — F902 Attention-deficit hyperactivity disorder, combined type: Secondary | ICD-10-CM

## 2024-06-18 DIAGNOSIS — F1121 Opioid dependence, in remission: Secondary | ICD-10-CM

## 2024-06-18 DIAGNOSIS — F1021 Alcohol dependence, in remission: Secondary | ICD-10-CM

## 2024-06-18 NOTE — Progress Notes (Signed)
 Testing and Report Writing Information: The following measures  were administered, scored, and interpreted by this provider:  Generalized Anxiety Disorder-7 (GAD-7; 5 minutes), Patient Health Questionnaire-9 (PHQ-9; 5 minutes), Wechsler Adult Intelligence Scale-Fourth Edition (WAIS-5; 70 minutes), CNS Vital Signs (45 minutes), Adult Attention Deficit/Hyperactivity Disorder Self-Report Scale Checklist (ASRSv1.1; 15 minutes), Behavior Rating Inventory for Executive Function - 2A - Self Report (BRIEF 2A; 20 minutes) and Behavior Rating Inventory for Executive Function - 2A - Informant (BRIEF-2A; 20 minutes), and Personality Assessment Inventory (PAI; 50 minutes). A total of 230 (230 - if no additions given) minutes was spent on the administration and scoring of the aforementioned measures. Codes 03863 and [phone] (7 units) were billed.  Please see the assessment for additional details. This provider completed the written report which includes integration of patient data, interpretation of standardized test results, interpretation of clinical data, review of information provided by Damon Blair and any collateral information/documentation, and clinical decision making (290 minutes in total).  Feedback Appointment: Date: 06/18/2024 Appointment Start Time: 11:03am Duration: 71 minutes Provider: Frederic Fire, PsyD Type of Session: Feedback Appointment for Evaluation  Location of Patient: Home Location of Provider: Provider's Home (private office) Type of Contact: Caregility video visit with audio  Session Content: Today's appointment was a telepsychological visit due to COVID-19. Damon Blair is aware it is his responsibility to secure confidentiality on his end of the session. He provided verbal consent to proceed with today's appointment. Prior to proceeding with today's appointment, Damon Blair's physical location at the time of this appointment was obtained as well a phone number he could be reached at in the event of technical  difficulties. Damon Blair denied anyone else being present in the room or on the virtual appointment. Damon Blair is aware of the limitations of teleheath visits and verbally consented to proceed.  This provider and Damon Blair completed the interactive feedback session which includes reviewing the aforementioned measures, treatment recommendations, and diagnostic conclusions.   The interactive feedback session was completed today and a total of 71 minutes was spent on feedback. Code 03867 was billed for feedback session.   DSM-5 Diagnosis(es):  F90.2 Attention-Deficit/Hyperactivity Disorder, Combined Presentation, Moderate F10.21 Alcohol Use Disorder, Moderate, In Sustained Remission  F11.21 Opioid Use Disorder, Moderate, In Sustained Remission   Time Requirements: Assessment scoring and interpreting: 230 minutes (billing code 03863 and [phone] [7 units]) Feedback: 71 minutes (billing code 03867) Report writing: 290 total minutes. 06/06/2024: 5:45-6:05pm. 06/11/2024: 4-5pm. 06/13/2024: 2:30-4:30pm. 06/05/2024: 2:30-3:30pm and 8:15-8:35pm. 12/3/20225: 12:30-12:40pm.(billing code 03866 [5 units])   Plan: Damon Blair provided verbal consent for his evaluation to be sent via e-mail. No further follow-up planned by this provider.       CONFIDENTIAL PSYCHOLOGICAL EVALUATION ______________________________________________________________________________ Name: [name] Date of Birth: [birth year]    Age: 33 Dates of Evaluation: 05/28/2024, 05/29/2024, 06/03/2024, and 06/05/2024  SOURCE AND REASON FOR REFERRAL: Damon Blair was referred by Dr. Luke Blair for an evaluation to ascertain if he meets the criteria for Attention Deficit/Hyperactivity Disorder (ADHD).   EVALUATIVE PROCEDURES: Clinical Interview with Damon Blair (05/28/2024 and 06/05/2024) Wechsler Adult Intelligence Scale-Fourth Edition (WAIS-5; 06/05/2024) CNS Vital Signs (06/03/2024) Adult Attention Deficit/Hyperactivity  Disorder Self-Report Scale Checklist (06/03/2024) Behavior Rating Inventory for Executive Function - A - Self Report Behavior Rating Inventory for Executive Function - 2A - Self Report (BRIEF-2A; 05/29/2024) and Informant (05/29/2024) Personality Assessment Inventory (PAI; 05/29/2024) Patient Health Questionnaire-9 (PHQ-9) Generalized Anxiety Disorder-7 (GAD-7)   BACKGROUND INFORMATION AND PRESENTING PROBLEM: Damon Blair is a 33 year old male who resides in  [place] .  Damon Blair reported he was diagnosed with ADHD, likely by his pediatrician, in the fourth or fifth grade and that he has "been on Adderall" since this time. However, he added there were periods without use of ADHD-related medication as he was "without [health] insurance." He further reported his medical care team has suggested he complete a "more up-to-date" ADHD evaluation before potentially prescribing medication for ADHD. He described Adderall as overall helpful, although shared it also "made [him] so reserved and calm" that he "found it harder to socialize." He endorsed experiencing the following ADHD-related symptoms:  Symptoms Frequency   Often Occasionally Infrequent/No Significant Issues  Inattention Criterion (DSM-5-TR)     Making careless mistakes and missing small details.  He attributed this symptom, which commonly occurs secondary to efforts to finish tasks quickly and to the belief that the work is "good enough," noting that it negatively impacted his academic functioning.     Being easily distracted by various stimuli and the mind often being elsewhere, even when no apparent distractions exist.  He reported regularly being distracted by various stimuli (e.g., irrelevant tasks and thoughts).    Trouble sustaining attention during conversations.  He also reported often having trouble sustaining his attention during conversations, attributing it to "racing thoughts" and "thinking about how to respond."     Does not follow through on instructions and fails to finish tasks.  He commonly had trouble with task initiation and maintenance, which he attributed to forgetfulness and inattention.     Difficulty organizing tasks and activities.  He often discussed being prone to clutter around him, experiencing task maintenance issues, and having trouble meeting deadlines, adding that forgetfulness and inattention seem to contribute to these problems.     Avoids, dislikes, or is reluctant to engage in tasks that require sustained mental effort. He attributed regularly putting off tasks despite "not liking to feel rushed," noting he is especially likely to put off tasks that are "something new" or "unknown."    Loses things necessary for tasks or activities.  He reported commonly misplacing items and forgetting needed items for tasks.    Forgetful in daily activities.  He also reported being prone to forgetting plans and appointments.     Hyperactivity and Impulsivity Criterion (DSM-5-TR)     Fidgets with hands or feet or squirms in seat. He shared that he habitually, and largely unconsciously, fidgets (e.g., bites his nails, "pick[s] skin" around his "fingertips," "mov[es]" his fingers, and "nod[s] [his] head."    Leaves seat in situations in which remaining seated is expected.   He denied experiencing significant issues with this symptom.   Feelings of restlessness. He stated he regularly feels restless, which contributes to difficulty relaxing. However, he shared that this symptom essentially became noticeable after he ceased use of Dilaudid .    Difficulty playing or engaging in leisure activities quietly. He noted that he is prone to making comments during leisure activities (e.g., "mhmm noises that just come out").    "On the go" or often acts as if "driven by a motor." He reported frequently feeling that he must be moving or doing something, adding that others comment on how "quick" he talks or is "moving."     Talks excessively.  He reported that he tends to provide a lot of detail when talking, especially on topics he knows well.     Interrupts others.  He described this symptom as often occurring.     Impatience.  He also described  himself as impatient.    ADHD-Related Symptoms that Assist in Identifying ADHD but are not in the DSM-5-TR Jeanna, 2021, p. 6-12 and [phone])     Emotional dysregulation and overstimulation. He discussed being prone to anxiety and anger, noting examples of how perceiving someone doing a task incorrectly can irritate him, as well as how he tends to become overwhelmed if an obstacle to a routine or plan occurs.     Making decisions impulsively. He shared a tendency to say things he later regrets, and that he tries to "promptly admit it" but "pride gets in the way sometimes." He also shared a tendency to purchase things he later regretted, but since then, he has "cancelled [employer] and keeps the negative repercussions in mind, which has been helpful.     Tending to drive much faster than others. He reported receiving speeding tickets in the past, which led him to regularly setting his cruise control "at 9 miles per hour over the speed limit."     Trouble following through on promises or commitments made to others.  He noted that forgetfulness and lack of drive contribute to this difficulty's regular occurrence.     Trouble doing things in proper order or sequence.   He denied experiencing significant issues with this symptom, adding that he prioritizes the use of routines.    Damon Blair discussed a history of depressive episodes that include low mood, reduced drive, hypersomnia and/or hyposomnia, and psychomotor agitation, with the last instance occurring in October of 2024; generalized anxiety that he shared has decreased in severity through his use of mental health services, spiritual practices, an accepting attitude, and substance use cessation; past sleep onset  issues that have improved through "getting back into a really good sleep pattern," limiting his use of caffeine before bed, and avoiding stimulating activities and screen time before his planned bed time; emotional eating-related behaviors (e.g., eating when not physically hungry but in an effort to improve his mood); and trauma and stressor-related disorder symptomatology (e.g., past intrusive thoughts and hypervigilance) that occurred secondary to unspecified events that occurred from ages 82 to 57, adding his "efforts lately have resolved some" of the "psychological trauma." He expressed a belief that his ADHD-related symptoms are consistent across situations, independent of mood, and are "ingrained into [his] identity," adding that ADHD-related symptoms have been occurring since childhood and that multiple sclerosis has exacerbated them. He stated his coping and compensatory strategies include using background music to help sustain his attention on a task, using a calendar, setting cruise control, engaging in mindfulness-based exercises, cancelling [employer] to reduce impulsive spending, and reminding himself of the various negative repercussions that specific actions can cause.  Damon Blair denied awareness of having ever experienced any developmental milestone delays, grade retention, learning disability diagnosis, or having an individualized education plan. However, he noted he "had a tutor for a little bit in middle school or so" for difficulties in English and Mathematics. He further stated he struggled to "comprehend a story or the [written] information" and that he "couldn't remember" or "capture the story into an imaginary concept." He discussed that school staff recommended he pursue an ADHD evaluation as he "wasn't sitting still," his "homework was sloppy," and he was prone to not completing assignments. He also discussed that he "started skipping school," using cannabis and drinking alcohol  in high school, which led to him being required to attend a "substance use treatment center." He expressed a belief that multiple sclerosis symptoms (  e.g., vision problems and inattention) worsened his academic difficulties. 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 He indicated that "if there is a name [for a disciplinary action]," he has "probably" gotten in trouble for it in a past job. However, he shared that he is doing well at his current employment, attributing this to natural strengths that support his work, his attention to the negative repercussions of various actions, and the importance he places on his current employment functioning. He endorsed past marital strains secondary to communication issues and his substance use, although he shared his marriage is currently "going great" as he is focused on maintaining substance cessation, and that he and his wife are focused on their "spiritual health."  Mr. Pellecchia reported his medical history is significant for multiple sclerosis that became noticeable and diagnosed in July of 2020, and that he utilizes medication and dietary changes to assist in managing; hepatitis C that has been medically managed and "clinically cured;" and a kidney stone. He also reported a history of head injury, with the first instance occurring at the age of 56 when he "came crashing down" on his head and neck, which left him in a "temporary paralysis," as well as at the age of 35 he "fell off a hand rail and landed right on [his] forehead," which resulted in loss of consciousness, feeling "very foggy," and having an "extreme headache for at least seven days or so. He denied awareness of having any lingering effects from the head injuries. 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 He reported his legal history is significant for "spending a couple of nights in jail a couple of different times." He stated his familial mental health history is significant for "exhibit[ing] behaviors of heavy drinking" (mother), problematic substance use (maternal uncle and brother), and possible ADHD as he had the "same inability to pay attention" and periods of "hyperfocus" (father).   Chart Review: Various chart notes since 2018 indicated Damon Blair has a history of ADHD symptoms, diagnosis, and prior use of Adderall to assist in managing the endorsed ADHD-related symptoms.  Per a 12/03/2023 appointment note, Dr. 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 He was well-groomed. He was oriented to the time, place, person, and purpose of the appointment. During the interview, Damon Blair was tangential and occasionally demonstrated abrupt increases in vocal volume. During the evaluation, Damon Blair verbalized  and/or demonstrated self-doubt (e.g., stating "I don't know"  or changing a correct answer to an erroneous answer), fidgeting (e.g., playing with his fingers and moving in his seat), impulsivity (e.g., making irrelevant comments to his answer while responding to a test item), self-expression difficulties (e.g., often providing lengthy answers to VCI subtest items and asking, "How do I put [the Vocabulary subtest's word definition] into words"), and working memory-related problems (e.g., indicating he had abruptly lost the digits he had said shortly after saying them and sharing that the numbers were "jumbled in [his] head"). Throughout the evaluation, he maintained appropriate eye contact. His thought processes and content were logical, coherent, and goal-directed. There were no overt signs of a thought disorder or perceptual disturbances, nor did he report such symptomatology. There was no evidence of paraphasias (i.e., errors in speech, gross mispronunciations, and word substitutions), repetition deficits, or disturbances in volume or prosody (i.e., rhythm and intonation). Overall, based on Mr. Marney approach to testing, the current results are believed to be a fair estimate of his abilities.  PROCEDURAL CONSIDERATIONS: Psychological testing measures were conducted through a virtual visit with video and audio capabilities, but otherwise in a standard manner.   The Wechsler Adult Intelligence Scale, Fifth Edition (WAIS-5) was administered via remote telepractice using digital stimulus materials on [employer]'s Q-global system. The remote testing environment appeared free of distractions, adequate rapport was established with the examinee via video/audio capabilities, and Mr. Koman appeared appropriately engaged in the task throughout the session. No significant technological problems or distractions were noted during administration. Modifications to the standardization procedure included: none. The  WAIS-5 subtests, or similar tasks, have received initial validation in several samples for remote telepractice and digital format administration, and the results are considered a valid description of Mr. Mestre skills and abilities.  CLINICAL FINDINGS:  COGNITIVE FUNCTIONING  Wechsler Adult Intelligence Scale, Fourth Edition (WAIS-5): Mr. Dung completed subtests of the WAIS-5, a full-scale measure of cognitive ability. The WAIS-5 comprises four indices that measure cognitive processes that comprise intellectual ability; however, only subtests from the Verbal Comprehension and Working Memory indices were administered. As a result, Full-Scale-IQ (FSIQ) and General Ability Index (GAI) could not be determined.   WAIS-5 Scale/Subtest Composite Score/Scaled Score 95% Confidence Interval Percentile Rank Qualitative Description  Verbal Comprehension (VCI) 108 100-115 70 Average  Similarities 11     Vocabulary 12     Working Memory (WMI) 88 82-96 21 Below Average  Digit Sequencing  7     Running Digits 9     Digits Forward 9      The Verbal Comprehension Index (VCI) measures one's ability to receive, comprehend, and express language. It also measures the ability to retrieve previously learned information and to understand relationships between words and concepts presented orally. Mr. Burley obtained a VCI score of 108 (70th percentile), placing him in the average range compared to same-aged peers. His performance on the subtests comprising this index was comparable, which suggests his verbal reasoning abilities are similarly developed.   The Working Memory Index (WMI) measures one's ability to sustain attention, concentrate, and exert mental control. Mr. Bell obtained a WMI score of 88 (21st percentile), placing him in the below average range compared to same-aged peers. Mr. Fant demonstrated similar performance on the subtests comprising this index. The 20-point  difference between his VCI and WMI is statistically significant at the .05 level, which suggests his ability to sustain attention, concentrate, and exert mental control is a weakness relative to his verbal reasoning abilities.  ATTENTION AND PROCESSING  CNS Vital  Signs: The CNS Vital Signs assessment evaluates the neurocognitive status of an individual and covers a range of mental processes. The results of the CNS Vital Signs testing indicated low average neurocognitive processing ability. Attentional abilities ranged from average to very low, with simple attention in the very low range, suggesting impairment in his ability to track and respond to a single defined stimulus over long periods. Executive function, working memory, and cognitive flexibility were comparably developed, but ranged from low average to average. Psychomotor speed and motor speed were above average, which suggests a strong ability to respond to visual-perceptual information and perform goal-directed movements. Reaction time and processing speed were very low to low, suggesting a slowed ability to respond to direction sets and process information. Visual memory (images) and verbal memory (words) were low average, which indicates they are comparably developed. The results suggest Mr. Capshaw experiences impairment in complex attention, simple attention, processing speed, and reaction time; weakness in verbal memory, visual memory, and executive function; and strengths in psychomotor speed and motor speed.   Domain  Standard Score Percentile Validity Indicator Guideline  Neurocognitive Index 85 16 Yes Low Average  Composite Memory 79 8 Yes Low   Verbal Memory 80 9 Yes Low Average  Visual Memory 86 18 Yes Low Average  Psychomotor Speed 110 75 Yes Above  Reaction Time 56 1 Yes Very Low  Complex Attention 91 27 Yes Average  Cognitive Flexibility 90 25 Yes Average  Processing Speed  75 5 Yes Low  Executive Function 88 21 Yes Low  Average  Working Memory 93 32 Yes Average  Sustained Attention 95 37 Yes Average  Simple Attention 44 1 Yes Very Low  Motor Speed 122 93 Yes Above   EXECUTIVE FUNCTION Behavior Rating Inventory of Executive Function, Second Edition EVENT ORGANISER) Self-Report: Mr. Mcgregor completed the Self-Report Form of the Behavior Rating Inventory of Executive Function-Adult Version, Second Edition [id number]), which has three domains that evaluate cognitive, behavioral, and emotional regulation, and a Global Executive Composite score provides an overall snapshot of executive functioning. There are no missing item responses in the protocol. The Negativity, Infrequency, and Inconsistency scales are not elevated, suggesting he did not respond to the protocol in an overly negative, haphazard, extreme, or inconsistent manner. In the context of these validity considerations, ratings of Mr. Cech everyday executive function suggest some areas of concern. The overall index score, the GEC, was highly elevated (GEC T = 77, %ile = 99). The Behavior Regulation Index (BRI), Emotion Regulation Index (ERI), and Cognitive Regulation Index (CRI) scores were all elevated (BRI T = 89, %ile = >99; ERI T = 74, %ile = 96, CRI T = 71, %ile = 98), suggesting self-regulatory problems in multiple domains. Mr. Mergenthaler indicated difficulty with his ability to resist impulses, be aware of his functioning in social settings, adjust well to changes, react to events appropriately, get going on tasks and activities and independently generate ideas, sustain working memory, plan and organize his approach to problem solving appropriately, and be appropriately cautious in his approach to tasks and check for mistakes. He did not describe his ability to modulate emotions and monitor social behavior as problematic. The elevated scores on the Shift and Emotional Control scales suggest he experiences significant problem-solving rigidity combined  with emotional dysregulation, which may leave him prone to losing emotional control when his routine or perspective is challenged and/or flexibility is required. Moreover, the elevated scores on scales reflecting problems with fundamental behavioral and/or emotional regulation (Inhibit, Emotional Control,  and Shift) suggest that more global problems with self-regulation are negatively affecting active cognitive problem-solving (elevated CRI).   Scale/Index  Raw Score T Score Percentile Qualitative Description  Inhibit 22 88 >99 Highly Elevated  Self-Monitor 15 81 >99 Highly Elevated  Behavior Regulation Index (BRI) 37 89 >99 Highly Elevated  Shift 12 65 96 Mildly Elevated  Emotional Control 19 74 97 Moderately Elevated  Emotion Regulation Index (ERI) 31 74 96 Moderately Elevated  Initiate 17 68 94 Mildly Elevated  Working Memory 18 75 99 Highly Elevated  Plan/Organize 20 78 99 Highly Elevated  Task Monitor 13 71 99 Moderately Elevated  Organization of Materials 12 53 73 Within Normal Limits  Cognitive Regulation Index (CRI) 80 71 98 Moderately Elevated  Global Executive Composite (GEC) 148 77 99 Highly Elevated   Validity Scale Raw Score Cumulative Percentile Protocol Classification  Inconsistency 5 98 Acceptable  Negativity 4 98 Acceptable  Infrequency 0 98 Acceptable   Behavior Rating Inventory of Executive Function, Second Edition EVENT ORGANISER) Informant:  Mr. Washko wife, Ms. Liz Claiborne, completed the Informant Form of the Behavior Rating Inventory of Executive Function-Adult Version, Second Edition [id number]), which is equivalent to the Self-Report version and has three domains that evaluate cognitive, behavioral, and emotional regulation, and a Global Executive Composite score provides an overall snapshot of executive functioning. There are no missing item responses in the protocol. The Negativity, Infrequency, and Inconsistency scales are not elevated, suggesting she did  not respond to the protocol in an overly negative, haphazard, extreme, or inconsistent manner. In the context of these validity considerations, Ms. Masten ratings of Mr. Erion everyday executive function suggest some areas of concern. The overall index score, the GEC, was moderately elevated (GEC T = 72, %ile = 95). The Behavior Regulation Index (BRI), Emotion Regulation Index (ERI), and Cognitive Regulation Index (CRI) scores were all elevated (BRI T = 79, %ile = >99; ERI T = 66, %ile = 93, CRI T = 65, %ile = 91), suggesting self-regulatory problems in multiple domains. Ms. Steinhauser indicated that Mr. Almond has difficulty resisting impulses, being aware of his functioning in social settings, reacting to events appropriately, sustaining working memory, and being appropriately cautious in his approach to tasks and in checking for mistakes. She did not describe his ability to adjust well to changes, get going on tasks and activities and independently generate ideas, plan and organize his approach to problem solving appropriately, and keep materials and belongings reasonably well-organized as problematic. However, the Shift, Initiate, and Plan/Organize scales approached abnormal elevations.   Scale/Index  Raw Score T Score Percentile Qualitative Description  Inhibit 18 74 >99 Moderately Elevated  Self-Monitor 15 72 >99 Moderately Elevated  Behavior Regulation Index (BRI) 33 79 >99 Highly Elevated  Shift 11 60 91 Approaching an Elevation  Emotional Control 17 66 95 Mildly Elevated  Emotion Regulation Index (ERI) 28 66 93 Mildly Elevated  Initiate 15 64 91 Approaching an Elevation  Working Memory 15 65 93 Mildly Elevated  Plan/Organize 14 61 84 Approaching an Elevation  Task-Monitor 13 66 >99 Mildly Elevated  Organization of Materials 14 56 75 Within Normal Limits  Cognitive Regulation Index (CRI) 71 65 91 Mildly Elevated  Global Executive Composite (GEC) 132 72 95 Moderately  Elevated   Validity Scale Raw Score Cumulative Percentile Protocol Classification  Inconsistency 3 99 Acceptable  Negativity 2 98 Acceptable  Infrequency 0 98 Acceptable   BEHAVIORAL FUNCTIONING   Patient Health Questionnaire-9 (PHQ-9): Mr. Mousel completed the PHQ-9, a self-report measure  of depressive symptoms. He scored 11/27, which indicates moderate depression.   Generalized Anxiety Disorder-7 (GAD-7): Mr. Sellin completed the GAD-7, a self-report measure that assesses symptoms of anxiety. He scored 7/21, which indicates mild anxiety.   Adult ADHD Self-Report Scale Symptom Checklist (ASRS): Mr. Colina reported the following symptoms as sometimes: difficulty wrapping up the final details of a project following the completion of challenging aspects, difficulty getting things in order when a task requires organization, problems remembering appointments or obligations, making careless mistakes when working on boring or complex projects, struggling to sustain attention when doing boring or repetitive work, being distracted by the noise around him, talking too much in social situations, and interrupting others when they are busy. He endorsed the following symptoms as occurring often: fidgeting or squirming, feeling overly active and compelled to do things, feeling restless or fidgety, and difficulty relaxing. He did not endorse any symptoms as occurring very often. The endorsement of at least four items in Part A is highly consistent with ADHD in adults. The frequency scores of Part B provides additional cues. Mr. Rewerts scored 5/6 on Part A and 3/12 on Part B, which is considered a positive screening for ADHD.   Personality Assessment Inventory (PAI): The PAI is an objective inventory of adult personality. The validity indicators suggested Mr. Springborn responded consistently to similar item content (ICN T = 49), and did not portray himself in an overly negative (NIM T =  51) or unrealistically favorable manner (PIM T = 50). However, he endorsed some unusual responses (INF T = 67), which suggest potential reading difficulties, confusion, or idiosyncratic item interpretation. As such, his results were only broadly interpreted. He endorsed ruminative preoccupation with physical functioning and health matters and severe impairment arising from somatic symptoms (SOM T = 96); experiencing some stress and worry (ANX T = 66) that is likely at least partially attributable to having probably experienced a past disturbing traumatic event that continues to be a source of distress (ARD-T T = 70); and being somewhat rigid in attitudes and behaviors (ARD-O T = 62). He also endorsed having an activity level that is somewhat higher than normal (MAN-A T = 60), impulsivity and hostility (BOR-S T = 64, ANT T = 79, AGG-P T = 66), and problems in concentration and decision making (SCZ-T T = 67); grandiosity (MAN-G T = 79); a history of difficulties with authorities and social conventions (ANT-A T = 79); and problematic use of alcohol that may be historical in nature (ALC T = 73), as well as drug dependence (DRG T = 94). He indicated he is self-assured and confident (DOM T = 65); warm and sympathetic (WRM T = 63); and has close, generally supportive relationships with friends and family (NON  T = 39). He appears to acknowledge significant difficulties in functioning and perceives that help is needed in dealing with them (RXR T = 35). Upon follow-up, Mr. Watford shared being uncertain on the timeframe of questions (e.g., "was it my entire life" or "just right now") and if substance use-related questions were related to recreational and/or illicit substances or current prescribed medications.     SUMMARY AND CLINICAL IMPRESSIONS: Mr. Whidbee is a 33 year old male who Dr. Luke Blair referred for an evaluation to determine if he currently meets criteria for a diagnosis of  Attention-Deficit/Hyperactivity Disorder (ADHD).   Mr. Quaintance reported he was previously diagnosed with ADHD in the "fourth or fifth grade" and that he largely utilized Adderall since that time, which he said  was overall helpful but "made [him] so reserved and calm" that he "found it harder to socialize." He further reported that he is pursuing an ADHD evaluation as his current medical care team recommended he receive a "more up-to-date" ADHD evaluation before potentially prescribing medication for ADHD. He expressed a belief that his ADHD-related symptoms have occurred since childhood, and are consistent across situations and independent of mood, but that multiple sclerosis has exacerbated them.  Mr. Eid was administered assessments during the evaluation to measure his current cognitive abilities. His verbal comprehension abilities were in the average range and similar. His ability to sustain attention, concentrate, and exert mental control was in the below average range, which suggests a weakness in attention, concentration, mental control, and shorter-term auditory memory. Results of the CNS Vital Signs indicated a low average neurocognitive processing ability, with impairment in complex attention, simple attention, processing speed, and reaction time; weakness in verbal memory, visual memory, and executive function; and strengths in psychomotor speed and motor speed.   During the clinical interview and on self-report measures, Mr. Hollett endorsed executive functioning concerns that include attentional dysregulation, hyperactivity- and impulsivity-related symptoms, and meeting the full criteria for ADHD. Moreover, his wife, Ms. Ariel Guggino-Boyd, also indicated perceiving him as experiencing multiple significant executive functioning issues. While the PAI validity concern make creating a diagnostic conclusion difficult, when considering self-reported symptoms; endorsed and/or  demonstrated impairment or weakness on measures of attention, executive functioning, processing speed, visual memory, and working memory; previously having been diagnosed with ADHD; and a possible familial history of ADHD, a diagnosis of F90.2 Attention-Deficit/Hyperactivity Disorder, Combined Presentation, Moderate appears warranted. The specifier of "Moderate" was assigned as he endorsed symptoms in excess of what is needed to make the diagnosis and indicated they cause impairment in academic (e.g., trouble "sitting still" and sustaining his attention during class, as well as completing class assignments by their deadlines), occupational (e.g., stating "if there is a name [for a disciplinary action]," he has "probably" gotten in trouble for it in past employments), social (e.g., frequently engaging in excessive talking and interrupting of others), and daily (e.g., regularly experiencing being easily distracted, task initiation and completion issues, disorganization, and forgetfulness) functioning.   Mr. Hughlett also discussed a history of depressive episodes, with the last episode occurring in October of 2024; generalized anxiety has decreased in severity though his use of mental health services, spiritual practices, an accepting attitude, and substance use cessation; past sleep onset issues that have improved through "getting back into a really good sleep pattern," limiting his use of caffeine before bed, and avoiding stimulating activities and screen time before his planned bed time; emotional eating-related behaviors; trauma and stressor-related disorder symptomatology that he attributed to unspecified events that occurred from ages 20 to 18, adding his "efforts lately have resolved some" of the "psychological trauma;" and problematic alcohol and opioid use, noting he ceased use of opiates and alcohol last year. As such, the PHQ-9, GAD-7, and PAI were administered. His results suggest he experiences  moderate depression- and mild anxiety-related symptoms, and past problematic substance use. 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 Given the limited scope of this evaluation, it was not possible to determine if the full criteria for depressive disorder, anxiety disorder, sleep-wake disorder, eating disorder, trauma and stressor-related disorder are met, or if his diagnoses of ADHD and substance use disorders better explain the symptoms. Thus, he would  likely benefit from further evaluation of these symptoms to definitively rule in or out the aforementioned disorders. Should any of the aforementioned be ruled in, they would likely be in addition to his diagnosis of ADHD, as he described his ADHD-related concerns as occurring before some of the mental health concerns, substance use, and endorsed trauma, as well as being consistent across situations and independent of mood.  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 Hennon would likely benefit from ongoing consultation regarding medication for ADHD symptoms.   Individual therapeutic services may assist in processing a diagnosis of ADHD and discussing coping and compensatory strategies, as well as processing his experiences.  Mr. Rosengren would likely benefit from making use of strategies for ADHD symptoms:  Setting a timer to complete tasks. Break tasks into manageable chunks and spread them out over more extended periods with breaks.  Utilizing lists and day calendars to keep track of tasks.  Answering emails daily.  Improve listening skills by asking the speaker to give information in smaller chunks and  asking for explanations and clarification as needed. Leaving more than the anticipated time to complete tasks. It may help to keep tasks brief, well within your attention span, and a mix of both high and low-interest tasks. Tasks may be gradually increased in length. Practice proactive planning by setting aside time every evening to plan for the next day (e.g., prepare needed materials or pack the car the night before).  Learn how to make a practical and reasonable "to-do" list of important tasks and priorities, and always keep it easily accessible. Make additional copies in case it is lost or misplaced. Utilize visual reminders by posting appointments, "to-do lists," or schedules in strategic areas at home and work.  Practice using an appointment book, smartphone, or other tech device, or a daily planning calendar, and learn to write down appointments and commitments immediately. Keep notepads or use a portable audio recorder to capture important ideas for later recall. Learn and practice time management skills. Purchase a programmable alarm watch or set an alarm on a smartphone to avoid losing track of time.  Use a color-coded file system, desk and closet organizers, storage boxes, or other organization devices to reduce clutter and improve efficiency and structure.  Implement ways to become more aware of your actions and to inhibit or adjust them as warranted (e.g., reviewing videos of your actions, considering consequences of obeying or not obeying the rules of various upcoming situations, having a trusted other to discuss plans with, and/or provide cues to stop certain behaviors, and make visual cues for rules you would like to follow). The 4Rs: Read just one paragraph, recite out loud in a soft voice or whisper what was important in that material, write that material down in a notebook, then review what you just wrote. Stay flexible and be prepared to change your plans, as symptom breakthroughs and  crises will likely occur periodically. Mr. Bolds may benefit from mindfulness training to address symptoms of inattention.  Mental alertness/energy can be raised by increasing exercise; improving sleep; eating a healthy diet; and managing stress. Consulting with a physician regarding any changes to the physical regimen is recommended. "Failing at Normal: An ADHD Success Story" by Harlene Solar is a great overview of ADHD. Dr. Nelwyn Pica also has a YouTube channel called, "Nelwyn Pica, PhD - Dedicated to ADHD  Science+" with helpful videos on ADHD-related topics: [email or web address]  Applications:  RescueTime. Tracks your activities on your phone and/or computer to determine how productive you have been and what distracted you. Free two-week trial.  [email or web address] . It uses engineered audio that may reduce distractions and assist with focus. Free 15-day trial. Freedom. Allows you to highlight days and times you want to block yourself from certain sites or apps. Free trial. Merrily.  It allows you to input your bank accounts and creates a visual layout of information about your financial goals, budget management, alerts, etc. May offer a free trial. Boomerang. It allows you to schedule times an email is sent and to see if others have received or opened your email. Ten messages free per month and a free trial of the premium version. IFTTT. Uses "channels" to create various actions (e.g., if you are mentioned in an email, highlight it in your inbox, and if you miss a call, add it to a to-do list). Free and premium versions. Unroll.me. Cleans up your email by unsubscribing from what you do not want to receive while still getting everything you do. Free. Finish. It allows you to divide two-list tasks into short-term, mid-term, and long-term tasks and determine how much time is left for a task. Focus mode hides non-priority tasks.  Autosilent. Turns your phone ringer on and  off based on specified calendars, geo-fences, timers, etc. $3.99. Freakyalarm. It makes you solve math problems to disable an alarm. $1.99. Wake N Shake. It makes you vigorously shake your phone to stop the alarm. $.99. Todoist. It allows you to add sub-tasks to tasks and includes email and web plugins to make it work across the system. Premium has location-based reminders, calendar sync, productive tracking, etc.  Sleep Cycle. Utilize your phone's motion sensors to catch movement while you are asleep. The alarm will wake you as early as 30 minutes before your alarm based on your lightest sleep phase and show you how daily activities affect your sleep quality.  Colletta. Gamifies mental wellness by letting you choose from a wide variety of self-care exercises to complete, which it rewards with the ability to care for a virtual pet. It also includes features such as mood tracking, breathing exercises, guided journaling, and setting mutual goals with friends. Books: "Taking Charge of Adult ADHD Second Edition" by Dr. Nelwyn Pica "The ADHD Effect on Marriage" by Eleanor Bowers "The Couples Guide to Thriving with ADHD" by Eleanor Bowers Organizations that are a reliable source of information on ADHD:  Children and Adults with Attention-Deficit/Hyperactivity Disorder (CHADD): chadd.org  Attention Deficit Disorder Association (ADDA): hotternames.de ADD Resources: addresources.org ADD WareHouse: addwarehouse.com World Federation of ADHD: adhd-federation.org ADDConsults: addconsults.com Compilation of ADHD resources: https://www.harrell.com/ Future evaluation, if deemed necessary, and/or to determine the effectiveness of recommended interventions.   Frederic Fire, Psy.D. Licensed Psychologist - HSP-P [phone]   References  Pica, R. A. (2021). Taking charge of adult ADHD: proven strategies to succeed at work, at   home, and in relationships (pp. 6-10 and 272-276). [employer]  [employer].              Frederic ONEIDA Fire, PsyD

## 2024-06-23 ENCOUNTER — Other Ambulatory Visit: Payer: Self-pay | Admitting: Neurology

## 2024-07-15 ENCOUNTER — Other Ambulatory Visit: Payer: Self-pay | Admitting: Neurology

## 2024-07-16 ENCOUNTER — Other Ambulatory Visit: Payer: Self-pay

## 2024-07-16 NOTE — Telephone Encounter (Signed)
 Requested Prescriptions   Pending Prescriptions Disp Refills   dronabinol  (MARINOL ) 5 MG capsule [Pharmacy Med Name: DRONABINOL  5 MG CAPSULE] 60 capsule 0    Sig: TAKE 1 CAPSULE BY MOUTH TWICE A DAY   diazepam  (VALIUM ) 5 MG tablet [Pharmacy Med Name: DIAZEPAM  5 MG TABLET] 30 tablet 3    Sig: TAKE 1 TABLET BY MOUTH EVERYDAY AT BEDTIME   Last seen 01/31/24 Next appt 08/21/24 Dispenses   Dispensed Days Supply Quantity Provider Pharmacy  DRONABINOL  5 MG CAPSULE 03/11/2024 30 60 each Vear Charlie LABOR, MD [facility] [phone] [facility] - ...  DRONABINOL  5 MG CAPSULE 01/05/2024 30 60 each Sater, Charlie [facility]OR[facility] [phone] - [facility]...  DRONABINOL  5 MG CAPSULE 11/05/2023 30 60 each Sater, Charlie [facility]OR, [facility] [phone] - B...  DRONABINOL  5 MG CAPSULE 09/24/2023 30 60 each Sater, Charlie LABOR, [facility] [phone] [facility] ...  DRONABINOL  5 MG CAPSULE 08/27/2023 30 60 each Sater, Charlie LABOR[facility] [phone] [facility] ...  DRONABINOL  5 MG CAPSULE 07/30/2023 30 60 each Sater, Charlie LABOR, [facility] [phone] [facility] - ...    About this Score  Dispenses   Dispensed Days Supply Quantity Provider Pharmacy  DIAZEPAM  5 MG TABLET 06/18/2024 30 30 each Sater, Charlie LABOR, MD [facility] [phone] [facility] ...  DIAZEPAM  5 MG TABLET 05/16/2024 30 30 each Sater, Charlie LABOR[facility] [phone] [facility] ...  DIAZEPAM  5 MG TABLET 04/17/2024 30 30 each Sater, Charlie LABOR, [facility] [phone] [facility] ...  DIAZEPAM  5 MG TABLET 03/18/2024 30 30 each Sater, Charlie LABOR[facility] [phone] [facility] ...  DIAZEPAM  5 MG TABLET 02/17/2024 30 30 each Sater, Charlie [facility]OR[facility] [phone] [facility] ...  DIAZEPAM  5 MG TABLET 01/15/2024 30 30 each Sater, Charlie [facility] [phone] [facility] ...  DIAZEPAM  5 MG TABLET 12/17/2023 30 30 each Sater, Charlie [facility]OR[facility] [phone] [facility] ...  DIAZEPAM  5 MG TABLET 11/19/2023 30 30 each Sater, Charlie LABOR, [facility] [phone] [facility] - ...  DIAZEPAM  5 MG TABLET 10/22/2023 30 30 each Sater, Charlie [facility]OR[facility] [phone] [facility] - ...  DIAZEPAM  5 MG  TABLET 09/21/2023 30 30 each Sater, Charlie LABOR, [facility] [phone] [facility] - ...  DIAZEPAM  5 MG TABLET 08/22/2023 30 30 each Sater, Charlie LABOR, [facility] [phone] [facility] - ...  DIAZEPAM  5 MG TABLET 07/23/2023 30 30 each Sater, Charlie LABOR[facility] [phone] [facility] - .SABRASABRA

## 2024-07-18 ENCOUNTER — Other Ambulatory Visit: Payer: Self-pay | Admitting: Family

## 2024-07-18 DIAGNOSIS — F902 Attention-deficit hyperactivity disorder, combined type: Secondary | ICD-10-CM

## 2024-07-18 MED ORDER — AMPHETAMINE-DEXTROAMPHETAMINE 10 MG PO TABS: 10.0000 mg | ORAL_TABLET | Freq: Every day | ORAL | 0 refills | Status: DC

## 2024-07-18 NOTE — Telephone Encounter (Signed)
 Spoke to pt he has enough to last him until provider returns and refill can be sent in

## 2024-07-23 ENCOUNTER — Telehealth: Payer: Self-pay

## 2024-07-23 NOTE — Telephone Encounter (Signed)
 I left voicemail for patient asking him to please call us  to reschedule his 08/15/2024 appointment with Dr. Luke Shade, as she will be out of the office.  I also sent a message to patient via MyChart.  E2C2 - when patient calls back, please assist him with rescheduling this appointment.

## 2024-07-28 ENCOUNTER — Telehealth: Payer: Self-pay

## 2024-07-28 NOTE — Telephone Encounter (Signed)
 Lm and sent MyChart message;  Your appontment on1/20/2026 has a time change to 10:30 am. If you are unable to come at this time, please calle the office to reschedule your appointment.  Thank you.

## 2024-08-05 ENCOUNTER — Ambulatory Visit

## 2024-08-05 VITALS — BP 120/70 | HR 93 | Temp 98.5°F | Ht 69.0 in | Wt 193.8 lb

## 2024-08-05 DIAGNOSIS — F902 Attention-deficit hyperactivity disorder, combined type: Secondary | ICD-10-CM | POA: Diagnosis not present

## 2024-08-05 DIAGNOSIS — R030 Elevated blood-pressure reading, without diagnosis of hypertension: Secondary | ICD-10-CM

## 2024-08-05 DIAGNOSIS — E7800 Pure hypercholesterolemia, unspecified: Secondary | ICD-10-CM | POA: Diagnosis not present

## 2024-08-05 DIAGNOSIS — F1111 Opioid abuse, in remission: Secondary | ICD-10-CM | POA: Diagnosis not present

## 2024-08-05 DIAGNOSIS — F1911 Other psychoactive substance abuse, in remission: Secondary | ICD-10-CM | POA: Diagnosis not present

## 2024-08-05 MED ORDER — AMPHETAMINE-DEXTROAMPHETAMINE 10 MG PO TABS: 10.0000 mg | ORAL_TABLET | Freq: Every day | ORAL | 0 refills | Status: AC

## 2024-08-05 NOTE — Assessment & Plan Note (Addendum)
 History, in remission.

## 2024-08-05 NOTE — Progress Notes (Signed)
 "  Established Patient Office Visit   Subjective  Patient ID: Damon Blair, male    DOB: 1990/09/23  Age: 34 y.o. MRN: [id number]  Chief Complaint  Patient presents with   ADHD    Discussed the use of AI scribe software for clinical note transcription with the patient, who gave verbal consent to proceed.  History of Present Illness Damon Blair is a 34 year old male with multiple sclerosis and dyslipidemia who presents for follow-up on ADHD.  He discontinued Lipitor due to experiencing body aches, which were more severe than his usual pain levels. Since stopping the medication, his pain has improved.He has a history of elevated LDL cholesterol. He mentions a family history of heart issues, noting that his grandfather Freddie) has a pacemaker. He has not had a coronary CT score done in the past.  He continues to take Adderall 10 mg for ADHD, which was diagnosed following a recent psychological assessment. 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  He mentions a history of substance abuse but is currently clean and sober. He finds support through his church community and a sponsor. He follows a [employer] supply, which includes high vegetable and fruit intake, to help manage his MS symptoms.  His blood pressure was high upon arrival, possibly due to stress and coffee consumption. He does not currently monitor his blood pressure at home.    ROS As per HPI    Objective:     BP 120/70 (Cuff Size: Large)   Pulse 93   Temp 98.5 F (36.9 C) (Oral)   Ht 5' 9 (1.753 m)   Wt 193 lb 12.8 oz (87.9 kg)   SpO2 95%   BMI 28.62 kg/m      08/05/2024   10:40 AM 06/04/2024   11:03 AM 04/10/2024    4:34 PM  Depression screen PHQ 2/9  Decreased Interest 1 1 1   Down, Depressed, Hopeless 1 0 0  PHQ - 2 Score 2 1 1   Altered sleeping 1 2 1   Tired, decreased energy 2 2 2   Change in appetite 0 1 0  Feeling bad or  failure about yourself  0 0 0  Trouble concentrating 1 2 1   Moving slowly or fidgety/restless 1 1 1   Suicidal thoughts 0 0 0  PHQ-9 Score 7 9 6    Difficult doing work/chores Somewhat difficult Somewhat difficult Somewhat difficult     Data saved with a previous flowsheet row definition      08/05/2024   10:40 AM 06/04/2024   11:04 AM 04/10/2024    4:35 PM 02/12/2024    8:21 AM  GAD 7 : Generalized Anxiety Score  Nervous, Anxious, on Edge 1 2  2  1    Control/stop worrying 1 1  1  1    Worry too much - different things 1 0  1  0   Trouble relaxing 2 2  1  1    Restless 1 1  1  1    Easily annoyed or irritable 2 1  1  1    Afraid - awful might happen 1 1  1  1    Total GAD 7 Score 9 8 8 6   Anxiety Difficulty Somewhat difficult Somewhat difficult Somewhat difficult Somewhat difficult     Data saved with a previous flowsheet row definition      08/05/2024   10:40 AM 06/04/2024   11:03 AM 04/10/2024    4:34  PM  Depression screen PHQ 2/9  Decreased Interest 1 1 1   Down, Depressed, Hopeless 1 0 0  PHQ - 2 Score 2 1 1   Altered sleeping 1 2 1   Tired, decreased energy 2 2 2   Change in appetite 0 1 0  Feeling bad or failure about yourself  0 0 0  Trouble concentrating 1 2 1   Moving slowly or fidgety/restless 1 1 1   Suicidal thoughts 0 0 0  PHQ-9 Score 7 9 6    Difficult doing work/chores Somewhat difficult Somewhat difficult Somewhat difficult     Data saved with a previous flowsheet row definition      08/05/2024   10:40 AM 06/04/2024   11:04 AM 04/10/2024    4:35 PM 02/12/2024    8:21 AM  GAD 7 : Generalized Anxiety Score  Nervous, Anxious, on Edge 1 2  2  1    Control/stop worrying 1 1  1  1    Worry too much - different things 1 0  1  0   Trouble relaxing 2 2  1  1    Restless 1 1  1  1    Easily annoyed or irritable 2 1  1  1    Afraid - awful might happen 1 1  1  1    Total GAD 7 Score 9 8 8 6   Anxiety Difficulty Somewhat difficult Somewhat difficult Somewhat difficult Somewhat  difficult     Data saved with a previous flowsheet row definition   SDOH Screenings   Food Insecurity: Food Insecurity Present (06/04/2024)  Housing: Low Risk (06/04/2024)  Transportation Needs: No Transportation Needs (06/04/2024)  Utilities: Not At Risk (05/04/2023)  Alcohol Screen: Low Risk (06/04/2024)  Depression (PHQ2-9): Medium Risk (08/05/2024)  Financial Resource Strain: Low Risk (06/04/2024)  Physical Activity: Sufficiently Active (06/04/2024)  Social Connections: Socially Integrated (06/04/2024)  Stress: Stress Concern Present (06/04/2024)  Tobacco Use: Medium Risk (08/05/2024)     Physical Exam Constitutional:      Appearance: Normal appearance.  HENT:     Head: Normocephalic and atraumatic.     Mouth/Throat:     Mouth: Mucous membranes are moist.  Neck:     Thyroid : No thyroid  mass or thyroid  tenderness.  Cardiovascular:     Rate and Rhythm: Normal rate and regular rhythm.  Pulmonary:     Effort: Pulmonary effort is normal.     Breath sounds: Normal breath sounds. No wheezing.  Abdominal:     General: Bowel sounds are normal.     Palpations: Abdomen is soft.  Musculoskeletal:     Cervical back: Neck supple. No rigidity.     Right lower leg: No edema.     Left lower leg: No edema.  Skin:    General: Skin is warm.  Neurological:     Mental Status: He is alert and oriented to person, place, and time.  Psychiatric:        Mood and Affect: Mood normal.        Behavior: Behavior normal.        No results found for any visits on 08/05/24.  The ASCVD Risk score (Arnett DK, et al., 2019) failed to calculate for the following reasons:   The 2019 ASCVD risk score is only valid for ages 9 to 23     Assessment & Plan:   Assessment & Plan Elevated LDL cholesterol level Discontinued Lipitor due to myalgia. Elevated LDL increases cardiovascular risk. Discussed coronary CT score for plaque assessment. Agreed to self-pay for CT.Ordered  coronary CT score.  Consider alternative statin/rosuvastatin  or cardiology referral if plaque present. Orders:   CT CARDIAC SCORING (SELF PAY ONLY); Future  ADHD (attention deficit hyperactivity disorder), combined type Known history of ADHD, combined type, stable on Adderall 10 mg daily.  PDMP reviewed.  Future refill sent.  Orders:   amphetamine -dextroamphetamine  (ADDERALL) 10 MG tablet; Take 1 tablet (10 mg total) by mouth daily.  History of heroin abuse (HCC) History, in remission.     History of substance abuse (HCC) History, in remission.     Elevated blood pressure reading Blood pressure on arrival was elevated.  Patient was talking on the phone, had coffee prior to his appointment.  Recommend home blood pressure monitoring with goal of less than 130 x 80 mmHg.  Incorporate DASH diet. Monitor blood pressure regularly. Follow-up in 3 months.      Return in about 3 months (around 11/03/2024) for ADHD, cholesterol, BP follow up .   Luke Shade, MD "

## 2024-08-05 NOTE — Assessment & Plan Note (Addendum)
 Known history of ADHD, combined type, stable on Adderall 10 mg daily.  PDMP reviewed.  Future refill sent.  Orders:   amphetamine -dextroamphetamine  (ADDERALL) 10 MG tablet; Take 1 tablet (10 mg total) by mouth daily.

## 2024-08-05 NOTE — Assessment & Plan Note (Signed)
 Blood pressure on arrival was elevated.  Patient was talking on the phone, had coffee prior to his appointment.  Recommend home blood pressure monitoring with goal of less than 130 x 80 mmHg.  Incorporate DASH diet. Monitor blood pressure regularly. Follow-up in 3 months.

## 2024-08-05 NOTE — Assessment & Plan Note (Addendum)
 Discontinued Lipitor due to myalgia. Elevated LDL increases cardiovascular risk. Discussed coronary CT score for plaque assessment. Agreed to self-pay for CT.Ordered coronary CT score. Consider alternative statin/rosuvastatin  or cardiology referral if plaque present. Orders:   CT CARDIAC SCORING (SELF PAY ONLY); Future

## 2024-08-15 ENCOUNTER — Ambulatory Visit

## 2024-08-20 NOTE — Progress Notes (Unsigned)
 "  [facility]  PATIENT: Damon Blair DOB: 1990/10/16  REFERRING DOCTOR OR PCP:  Cresencio Fairly, MD; Glenys Ferrari, MD SOURCE: patient, notes from Dr. Jasmine ([facility]), imaging and lab results.  MRI images personally reviewed.  _________________________________   HISTORICAL  CHIEF COMPLAINT:  No chief complaint on file.   HISTORY OF PRESENT ILLNESS:  Patient returns for follow-up visit.  Previously seen Dr. Vear on 01/31/2024.  He initially establish care in this office in 06/2023.  He remains on Copaxone 40 mg 3 times weekly.  Repeat MRI 02/2024 without any new or concerning findings.   Currently, he notes that he has reduced balance affecting his gait.SABRA   He uses the bannister on stairs.   He can walk long distances and keeps up with others on long walks.   His left leg is a little weaker than the right.  He also has left leg spasticity and takes baclofen  with benefit.  He has numbness in his fingertips bilaterally.  Sometimes has a Lhermitte sign.   Handwriting is sloppy and he notes mild reduced coordination - especially with his non-dominant left hand.   Voice is slightly slurred.    His vision is much better now, near normal.  Left eye pain is better but not resolved.     He has urinary urgency and occasional urge incontinence dribbling.   He was once on oxybutynin  with some benefit.    He notes cognitive and mood changes.   He sees psychiatry.  Amitriptyline and buspirone were recommended.   He had been on amitriptyline for dysesthesias in the past for dysesthesias.   He feels depressed and is more irritable.  He has ADD and has been on Adderall with some benefit in fatigue and completing tasks.     He is on dronabinol  (was on medical MJ in [place] ) for MS spasticity and pain.    He is sleeping better.  He has neck pain and has had a benefit from a cervical ESI.   He has concerns about stronger MS medication due to h/o Hep C and several viral STD.  He  has few outbreak now.   He has Hep C and was successfully treated.  Recent labs show Ab to Hep C but is RNA negative.        MS history  diagnosed with MS in July 2020 after presenting with nystagmus and paresthesias in the limbs. He was initially placed on Copaxone 40 mg three times per week.   He had a relapse in 2021 with left sided facial weakness and partial left body numbness.  He changed his diet to the the [facility] protocol.     He did well in 2022 and 2023.  More recently, he had the onset of left eye pain, blurry vision and light sensitivity.   He has seen ophthalmology and got new lenses but vision changed and he needed new lenses.   On recheck, vision had changed again.  However, he was apparently correctable to 20/20.  He continued to have eye pain and had an MRI of the orbits.   The orbits were fine but he had a small enhancing right parietal lesion.  Most likely would have been asymptomatic.  He had several courses of IV Solu-medrol  due to the concern about optic neuritis.    This caused him to have some 'manic' like symptoms and poor sleep.      Imaging: MRI of the orbits 06/06/2023 (report summary) showed a similar distribution  of white matter changes consistent with MS.  There was a new enhancing 2 mm lesion in the right parietal lobe.  The orbits had an unremarkable appearance.  Chronic sinusitis.  MRI of the brain 05/04/2023 shows T2/FLAIR hyperintense foci in the left anterior medulla, pons, left middle cerebellar peduncle, and in the periventricular, juxtacortical deep white matter of the cerebral hemispheres.  None of the foci enhanced.  1 focus in the basal ganglia/near the genu of the left internal capsule has an appearance more typical of a chronic lacunar infarction.  MRI of the cervical spine 05/05/2023 showed T2 hyperintense foci at the cervicomedullary junction, anteriorly adjacent to C1, anterolaterally to the right adjacent to C2, centrally adjacent to C3-C4, anteriorly  to the left adjacent to C5,  MRI of the thoracic spine 12/02/2022 showed a normal spinal cord.  There are small disc protrusions at T6-T7 and T7-T8 but no spinal stenosis or nerve root compression.   Laboratory test: 06/12/2023: Hepatitis B surface antibody positive, surface antigen negative, core antibody negative, TB negative Hep C antibody was reactive but the PCR was negative 05/22/2023: ESR and CRP were normal  REVIEW OF SYSTEMS: Constitutional: No fevers, chills, sweats, or change in appetite Eyes: See above Ear, nose and throat: No hearing loss, ear pain, nasal congestion, sore throat Cardiovascular: No chest pain, palpitations Respiratory:  No shortness of breath at rest or with exertion.   No wheezes GastrointestinaI: No nausea, vomiting, diarrhea, abdominal pain, fecal incontinence Genitourinary:  No dysuria, urinary retention or frequency.  No nocturia. Musculoskeletal:  No neck pain, back pain Integumentary: No rash, pruritus, skin lesions Neurological: as above Psychiatric: Notes anxiety and depression  endocrine: No palpitations, diaphoresis, change in appetite, change in weigh or increased thirst Hematologic/Lymphatic:  No anemia, purpura, petechiae. Allergic/Immunologic: No itchy/runny eyes, nasal congestion, recent allergic reactions, rashes  ALLERGIES: Allergies  Allergen Reactions   Clavulanic Acid    Dilaudid  [Hydromorphone ]    Oxycodone -Acetaminophen  Itching   Oxycodone  Itching    HOME MEDICATIONS:  Current Outpatient Medications:    acetaminophen  (TYLENOL ) 325 MG tablet, TAKE 2 TABLETS BY MOUTH EVERY 6 HOURS AS NEEDED, Disp: 90 tablet, Rfl: 2   amphetamine -dextroamphetamine  (ADDERALL) 10 MG tablet, Take 1 tablet (10 mg total) by mouth daily., Disp: 30 tablet, Rfl: 0   baclofen  (LIORESAL ) 20 MG tablet, Take 2 pills po qAM and 1 pill po qHS, Disp: 270 each, Rfl: 3   cetirizine  (ZYRTEC ) 10 MG tablet, TAKE 1 TABLET BY MOUTH EVERY DAY, Disp: 90 tablet, Rfl:  1   Cholecalciferol (VITAMIN D -3) 25 MCG (1000 UT) CAPS, Take by mouth., Disp: , Rfl:    cyanocobalamin  (VITAMIN B12) 1000 MCG/ML injection, Inject 1 mL (1,000 mcg total) into the muscle every 30 (thirty) days., Disp: 1 mL, Rfl: 11   diazepam  (VALIUM ) 5 MG tablet, TAKE 1 TABLET BY MOUTH EVERYDAY AT BEDTIME, Disp: 30 tablet, Rfl: 3   dronabinol  (MARINOL ) 5 MG capsule, TAKE 1 CAPSULE BY MOUTH TWICE A DAY, Disp: 60 capsule, Rfl: 0   gabapentin  (NEURONTIN ) 800 MG tablet, Take 1 tablet (800 mg total) by mouth 2 (two) times daily., Disp: 180 tablet, Rfl: 3   Glatiramer  Acetate (GLATOPA ) 40 MG/ML SOSY, INJECT 1 ML UNDER THE SKIN THREE TIMES A WEEK, Disp: 12 mL, Rfl: 11   imiquimod  (ALDARA ) 5 % cream, Apply topically 3 (three) times a week., Disp: 12 each, Rfl: 0   Omega-3 Fatty Acids (FISH OIL) 500 MG CAPS, Take by mouth., Disp: , Rfl:  valACYclovir  (VALTREX ) 1000 MG tablet, Take 1000 mg, twice a day for 10 days at the onset of rash., Disp: 20 tablet, Rfl: 3  PAST MEDICAL HISTORY: Past Medical History:  Diagnosis Date   Acute pyelonephritis 11/15/2021   Anxiety    Arthritis    Hydronephrosis with urinary obstruction due to ureteral calculus 11/15/2021   Influenza A 09/17/2023   Kidney stones    Left side   MS (multiple sclerosis)    Neuromuscular disorder (HCC)    Pyelonephritis of left kidney 11/15/2021    PAST SURGICAL HISTORY: Past Surgical History:  Procedure Laterality Date   CYSTOSCOPY W/ RETROGRADES  11/18/2021   Procedure: CYSTOSCOPY WITH RETROGRADE PYELOGRAM;  Surgeon: Francisca Redell BROCKS, MD;  Location: [facility];  Service: Urology;;   CYSTOSCOPY/URETEROSCOPY/HOLMIUM LASER/STENT PLACEMENT Left 11/18/2021   Procedure: CYSTOSCOPY/URETEROSCOPY/HOLMIUM LASER/STENT PLACEMENT;  Surgeon: Francisca Redell BROCKS, MD;  Location: [facility];  Service: Urology;  Laterality: Left;   FRACTURE SURGERY Left    Fibula, left    FAMILY HISTORY: Family History  Problem Relation Age of Onset   Anxiety  disorder Mother    Arthritis Mother    ADD / ADHD Father    Arthritis Father    Hearing loss Father    Hypertension Father    Early death Brother    Arthritis Maternal Grandmother    Asthma Maternal Grandmother     SOCIAL HISTORY: Social History   Socioeconomic History   Marital status: Married    Spouse name: Not on file   Number of children: Not on file   Years of education: Not on file   Highest education level: Associate degree: occupational, scientist, product/process development, or vocational program  Occupational History   Not on file  Tobacco Use   Smoking status: Former    Types: Cigarettes    Passive exposure: Never   Smokeless tobacco: Former    Types: Snuff  Vaping Use   Vaping status: Former  Substance and Sexual Activity   Alcohol use: Not Currently   Drug use: Not Currently   Sexual activity: Yes    Birth control/protection: None  Other Topics Concern   Not on file  Social History Narrative   Not on file   Social Drivers of Health   Tobacco Use: Medium Risk (08/05/2024)   Patient History    Smoking Tobacco Use: Former    Smokeless Tobacco Use: Former    Passive Exposure: Never  Programmer, Applications: Low Risk (06/04/2024)   Overall Financial Resource Strain (CARDIA)    Difficulty of Paying Living Expenses: Not very hard  Food Insecurity: Food Insecurity Present (06/04/2024)   Epic    Worried About Programme Researcher, Broadcasting/film/video in the Last Year: Sometimes true    Ran Out of Food in the Last Year: Sometimes true  Transportation Needs: No Transportation Needs (06/04/2024)   Epic    Lack of Transportation (Medical): No    Lack of Transportation (Non-Medical): No  Physical Activity: Sufficiently Active (06/04/2024)   Exercise Vital Sign    Days of Exercise per Week: 5 days    Minutes of Exercise per Session: 60 min  Stress: Stress Concern Present (06/04/2024)   [employer] of Occupational Health - Occupational Stress Questionnaire    Feeling of Stress: To some extent   Social Connections: Socially Integrated (06/04/2024)   Social Connection and Isolation Panel    Frequency of Communication with Friends and Family: Three times a week    Frequency of Social Gatherings with Friends and  Family: Once a week    Attends Religious Services: More than 4 times per year    Active Member of Clubs or Organizations: Yes    Attends Banker Meetings: More than 4 times per year    Marital Status: Married  Catering Manager Violence: Not At Risk (05/04/2023)   Humiliation, Afraid, Rape, and Kick questionnaire    Fear of Current or Ex-Partner: No    Emotionally Abused: No    Physically Abused: No    Sexually Abused: No  Depression (PHQ2-9): Medium Risk (08/05/2024)   Depression (PHQ2-9)    PHQ-2 Score: 7  Alcohol Screen: Low Risk (06/04/2024)   Alcohol Screen    Last Alcohol Screening Score (AUDIT): 1  Housing: Low Risk (06/04/2024)   Epic    Unable to Pay for Housing in the Last Year: No    Number of Times Moved in the Last Year: 1    Homeless in the Last Year: No  Utilities: Not At Risk (05/04/2023)   [employer] Utilities    Threatened with loss of utilities: No  Health Literacy: Not on file       PHYSICAL EXAM  There were no vitals filed for this visit.   There is no height or weight on file to calculate BMI.  No results found.    General: The patient is well-developed and well-nourished and in no acute distress  HEENT:  Head is [place]/AT.  Sclera are anicteric.  Funduscopic exam shows normal optic discs and retinal vessels.  Neck: No carotid bruits are noted.    Skin: Extremities are without rash or  edema.   Neurologic Exam  Mental status: The patient is alert and oriented x 3 at the time of the examination. The patient has apparent normal recent and remote memory, with an apparently normal attention span and concentration ability.   Speech is normal.  Cranial nerves: Extraocular movements are full. Pupils are equal, round, and  reactive to light and accomodation.   There is good facial sensation to soft touch bilaterally.Facial strength is normal.  Trapezius and sternocleidomastoid strength is normal. No dysarthria is noted.  The tongue is midline, and the patient has symmetric elevation of the soft palate. No obvious hearing deficits are noted.  Motor:  Muscle bulk is normal.   Tone is normal. Strength is  5 / 5 in all 4 extremities.   Sensory: Sensation was fairly symmetric to touch, temperature and vibration in the arms.  Symmetric vibration sensation in the legs but reduced touch sensation in left leg  Coordination: Cerebellar testing reveals good finger-nose-finger and heel-to-shin bilaterally.  Gait and station: Station is normal.   Gait is normal. Tandem gait is minimally wide. Romberg is negative.   Reflexes: Deep tendon reflexes are symmetric and normal bilaterally.       DIAGNOSTIC DATA (LABS, IMAGING, TESTING) - I reviewed patient records, labs, notes, testing and imaging myself where available.  Lab Results  Component Value Date   WBC 6.2 06/01/2023   HGB 14.3 06/01/2023   HCT 42.4 06/01/2023   MCV 88.0 06/01/2023   PLT 261 06/01/2023      Component Value Date/Time   NA 140 02/12/2024 0848   K 4.4 02/12/2024 0848   CL 103 02/12/2024 0848   CO2 29 02/12/2024 0848   GLUCOSE 93 02/12/2024 0848   BUN 17 02/12/2024 0848   CREATININE 0.83 02/12/2024 0848   CALCIUM  9.5 02/12/2024 0848   PROT 6.9 02/12/2024 0848   ALBUMIN 4.9  02/12/2024 0848   AST 17 02/12/2024 0848   ALT 17 02/12/2024 0848   ALKPHOS 42 02/12/2024 0848   BILITOT 0.5 02/12/2024 0848   GFRNONAA >60 06/01/2023 1004        ASSESSMENT AND PLAN  No diagnosis found.   He currently appears stable on Copaxone.  Repeat MR brain 02/2024 without evidence of new lesions.  He did have some breakthrough activity late 2024 (1 enhancing focus noted on MRI).  Dr. Vear previously recommended that he change to a more efficacious  medication but patient preferred to continue with Copaxone.  Plan to repeat imaging around 02/2025 Continue gabapentin  800 mg po bid (renew at bid next time), dronabinol .  He is also on Adderall prescribed by his PCP Rtc 6 months or sooner if new or worsening neurologic issues.      I personally spent a total of *** minutes in the care of the patient today including {Time Based Coding:[id number]}.  Harlene Bogaert, AGNP-BC  [facility] [address] [place], [place] [address]  Phone [phone] Fax [phone] Note: This document was prepared with digital dictation and possible smart phrase technology. Any transcriptional errors that result from this process are unintentional.  "

## 2024-08-21 ENCOUNTER — Ambulatory Visit: Admitting: Adult Health

## 2024-08-21 ENCOUNTER — Encounter: Payer: Self-pay | Admitting: Adult Health

## 2024-08-21 VITALS — BP 127/76 | HR 74 | Ht 69.0 in | Wt 193.2 lb

## 2024-08-21 DIAGNOSIS — R252 Cramp and spasm: Secondary | ICD-10-CM | POA: Diagnosis not present

## 2024-08-21 DIAGNOSIS — G35A Relapsing-remitting multiple sclerosis: Secondary | ICD-10-CM

## 2024-08-21 DIAGNOSIS — G894 Chronic pain syndrome: Secondary | ICD-10-CM

## 2024-08-21 MED ORDER — BACLOFEN 20 MG PO TABS: ORAL_TABLET | ORAL | 3 refills | Status: AC

## 2024-08-28 ENCOUNTER — Ambulatory Visit: Admitting: Student in an Organized Health Care Education/Training Program

## 2024-09-04 ENCOUNTER — Ambulatory Visit

## 2024-11-04 ENCOUNTER — Ambulatory Visit

## 2025-02-18 ENCOUNTER — Ambulatory Visit: Admitting: Neurology
